# Patient Record
Sex: Female | Born: 1955 | Race: Black or African American | Hispanic: No | Smoking: Current every day smoker
Health system: Southern US, Community
[De-identification: ages and names within clinical notes are randomized; demographics above are authoritative.]

## PROBLEM LIST (undated history)

## (undated) DIAGNOSIS — I341 Nonrheumatic mitral (valve) prolapse: Secondary | ICD-10-CM

## (undated) DIAGNOSIS — Z720 Tobacco use: Secondary | ICD-10-CM

## (undated) DIAGNOSIS — B1920 Unspecified viral hepatitis C without hepatic coma: Secondary | ICD-10-CM

## (undated) DIAGNOSIS — B9681 Helicobacter pylori [H. pylori] as the cause of diseases classified elsewhere: Secondary | ICD-10-CM

## (undated) DIAGNOSIS — F1010 Alcohol abuse, uncomplicated: Secondary | ICD-10-CM

## (undated) DIAGNOSIS — Z9189 Other specified personal risk factors, not elsewhere classified: Secondary | ICD-10-CM

## (undated) DIAGNOSIS — K7460 Unspecified cirrhosis of liver: Secondary | ICD-10-CM

## (undated) DIAGNOSIS — M79606 Pain in leg, unspecified: Secondary | ICD-10-CM

## (undated) DIAGNOSIS — Z9289 Personal history of other medical treatment: Secondary | ICD-10-CM

## (undated) DIAGNOSIS — C220 Liver cell carcinoma: Secondary | ICD-10-CM

## (undated) DIAGNOSIS — Q782 Osteopetrosis: Secondary | ICD-10-CM

## (undated) DIAGNOSIS — K2970 Gastritis, unspecified, without bleeding: Secondary | ICD-10-CM

## (undated) DIAGNOSIS — I10 Essential (primary) hypertension: Secondary | ICD-10-CM

## (undated) HISTORY — DX: Nonrheumatic mitral (valve) prolapse: I34.1

## (undated) HISTORY — DX: Other specified personal risk factors, not elsewhere classified: Z91.89

## (undated) HISTORY — PX: OTHER SURGICAL HISTORY: SHX169

## (undated) HISTORY — DX: Essential (primary) hypertension: I10

## (undated) HISTORY — PX: LAPAROSCOPY: SHX197

## (undated) HISTORY — DX: Tobacco use: Z72.0

## (undated) HISTORY — DX: Personal history of other medical treatment: Z92.89

## (undated) HISTORY — DX: Gastritis, unspecified, without bleeding: K29.70

## (undated) HISTORY — DX: Alcohol abuse, uncomplicated: F10.10

## (undated) HISTORY — PX: ABDOMINAL HYSTERECTOMY: SHX81

## (undated) HISTORY — DX: Helicobacter pylori (H. pylori) as the cause of diseases classified elsewhere: B96.81

## (undated) HISTORY — PX: CARPAL TUNNEL RELEASE: SHX101

## (undated) HISTORY — DX: Pain in leg, unspecified: M79.606

---

## 2003-08-09 ENCOUNTER — Inpatient Hospital Stay (HOSPITAL_COMMUNITY): Admission: EM | Admit: 2003-08-09 | Discharge: 2003-08-11 | Payer: Self-pay | Admitting: Emergency Medicine

## 2006-06-19 ENCOUNTER — Ambulatory Visit: Payer: Self-pay | Admitting: Family Medicine

## 2006-06-20 ENCOUNTER — Ambulatory Visit: Payer: Self-pay | Admitting: *Deleted

## 2006-06-21 ENCOUNTER — Ambulatory Visit: Payer: Self-pay | Admitting: Family Medicine

## 2006-06-23 ENCOUNTER — Ambulatory Visit (HOSPITAL_COMMUNITY): Admission: RE | Admit: 2006-06-23 | Discharge: 2006-06-23 | Payer: Self-pay | Admitting: Family Medicine

## 2006-06-27 ENCOUNTER — Ambulatory Visit: Payer: Self-pay | Admitting: Internal Medicine

## 2006-07-10 ENCOUNTER — Ambulatory Visit: Payer: Self-pay | Admitting: Family Medicine

## 2006-07-17 ENCOUNTER — Ambulatory Visit: Payer: Self-pay | Admitting: Internal Medicine

## 2006-07-24 ENCOUNTER — Ambulatory Visit: Payer: Self-pay | Admitting: Internal Medicine

## 2006-07-27 ENCOUNTER — Ambulatory Visit (HOSPITAL_COMMUNITY): Admission: RE | Admit: 2006-07-27 | Discharge: 2006-07-27 | Payer: Self-pay | Admitting: Family Medicine

## 2006-09-25 ENCOUNTER — Ambulatory Visit: Payer: Self-pay | Admitting: Family Medicine

## 2007-06-13 ENCOUNTER — Encounter (INDEPENDENT_AMBULATORY_CARE_PROVIDER_SITE_OTHER): Payer: Self-pay | Admitting: *Deleted

## 2007-06-21 ENCOUNTER — Ambulatory Visit: Payer: Self-pay | Admitting: Internal Medicine

## 2007-06-21 LAB — CONVERTED CEMR LAB
ALT: 51 units/L — ABNORMAL HIGH (ref 0–35)
AST: 52 units/L — ABNORMAL HIGH (ref 0–37)
Albumin: 3.9 g/dL (ref 3.5–5.2)
Alkaline Phosphatase: 82 units/L (ref 39–117)
BUN: 11 mg/dL (ref 6–23)
CO2: 25 meq/L (ref 19–32)
Calcium: 10 mg/dL (ref 8.4–10.5)
Chloride: 103 meq/L (ref 96–112)
Creatinine, Ser: 0.86 mg/dL (ref 0.40–1.20)
Glucose, Bld: 87 mg/dL (ref 70–99)
Potassium: 4.4 meq/L (ref 3.5–5.3)
Sodium: 139 meq/L (ref 135–145)
Total Bilirubin: 0.5 mg/dL (ref 0.3–1.2)
Total Protein: 8.1 g/dL (ref 6.0–8.3)

## 2007-07-09 ENCOUNTER — Ambulatory Visit: Payer: Self-pay | Admitting: Internal Medicine

## 2007-07-09 LAB — CONVERTED CEMR LAB
Basophils Absolute: 0 10*3/uL (ref 0.0–0.1)
Basophils Relative: 0 % (ref 0–1)
Cholesterol: 198 mg/dL (ref 0–200)
Eosinophils Absolute: 0.1 10*3/uL (ref 0.0–0.7)
Eosinophils Relative: 1 % (ref 0–5)
HCT: 45.6 % (ref 36.0–46.0)
HDL: 62 mg/dL (ref >39)
Hemoglobin: 14.9 g/dL (ref 12.0–15.0)
LDL Cholesterol: 120 mg/dL — ABNORMAL HIGH (ref 0–99)
Lymphocytes Relative: 51 % — ABNORMAL HIGH (ref 12–46)
Lymphs Abs: 4.1 10*3/uL — ABNORMAL HIGH (ref 0.7–3.3)
MCHC: 32.7 g/dL (ref 30.0–36.0)
MCV: 99.8 fL (ref 78.0–100.0)
Monocytes Absolute: 1.4 10*3/uL — ABNORMAL HIGH (ref 0.2–0.7)
Monocytes Relative: 17 % — ABNORMAL HIGH (ref 3–11)
Neutro Abs: 2.5 10*3/uL (ref 1.7–7.7)
Neutrophils Relative %: 31 % — ABNORMAL LOW (ref 43–77)
Platelets: 320 10*3/uL (ref 150–400)
RBC: 4.57 M/uL (ref 3.87–5.11)
RDW: 14.7 % — ABNORMAL HIGH (ref 11.5–14.0)
Total CHOL/HDL Ratio: 3.2
Triglycerides: 78 mg/dL (ref <150)
VLDL: 16 mg/dL (ref 0–40)
WBC: 8.1 10*3/uL (ref 4.0–10.5)

## 2007-09-05 ENCOUNTER — Encounter: Admission: RE | Admit: 2007-09-05 | Discharge: 2007-09-05 | Payer: Self-pay | Admitting: Family Medicine

## 2008-01-17 ENCOUNTER — Ambulatory Visit: Payer: Self-pay | Admitting: Internal Medicine

## 2008-04-18 ENCOUNTER — Emergency Department (HOSPITAL_COMMUNITY): Admission: EM | Admit: 2008-04-18 | Discharge: 2008-04-19 | Payer: Self-pay | Admitting: Emergency Medicine

## 2009-07-17 ENCOUNTER — Ambulatory Visit: Payer: Self-pay | Admitting: Internal Medicine

## 2009-07-17 ENCOUNTER — Encounter (INDEPENDENT_AMBULATORY_CARE_PROVIDER_SITE_OTHER): Payer: Self-pay | Admitting: Adult Health

## 2009-07-17 LAB — CONVERTED CEMR LAB
ALT: 188 units/L — ABNORMAL HIGH (ref 0–35)
AST: 212 units/L — ABNORMAL HIGH (ref 0–37)
Albumin: 3.9 g/dL (ref 3.5–5.2)
Alkaline Phosphatase: 85 units/L (ref 39–117)
BUN: 11 mg/dL (ref 6–23)
Basophils Absolute: 0 10*3/uL (ref 0.0–0.1)
Basophils Relative: 1 % (ref 0–1)
CO2: 22 meq/L (ref 19–32)
Calcium: 9.2 mg/dL (ref 8.4–10.5)
Chloride: 105 meq/L (ref 96–112)
Cholesterol: 213 mg/dL — ABNORMAL HIGH (ref 0–200)
Creatinine, Ser: 0.76 mg/dL (ref 0.40–1.20)
Eosinophils Absolute: 0.1 10*3/uL (ref 0.0–0.7)
Eosinophils Relative: 1 % (ref 0–5)
Folate: 17.8 ng/mL
Glucose, Bld: 63 mg/dL — ABNORMAL LOW (ref 70–99)
HCT: 44.6 % (ref 36.0–46.0)
HDL: 61 mg/dL (ref >39)
Hemoglobin: 14 g/dL (ref 12.0–15.0)
LDL Cholesterol: 136 mg/dL — ABNORMAL HIGH (ref 0–99)
Lymphocytes Relative: 56 % — ABNORMAL HIGH (ref 12–46)
Lymphs Abs: 4.2 10*3/uL — ABNORMAL HIGH (ref 0.7–4.0)
MCHC: 31.4 g/dL (ref 30.0–36.0)
MCV: 102.3 fL — ABNORMAL HIGH (ref 78.0–100.0)
Monocytes Absolute: 1 10*3/uL (ref 0.1–1.0)
Monocytes Relative: 13 % — ABNORMAL HIGH (ref 3–12)
Neutro Abs: 2.2 10*3/uL (ref 1.7–7.7)
Neutrophils Relative %: 30 % — ABNORMAL LOW (ref 43–77)
Platelets: 255 10*3/uL (ref 150–400)
Potassium: 4.3 meq/L (ref 3.5–5.3)
RBC: 4.36 M/uL (ref 3.87–5.11)
RDW: 14.9 % (ref 11.5–15.5)
RPR Ser Ql: REACTIVE — AB
RPR Titer: 1:1 {titer}
Sodium: 139 meq/L (ref 135–145)
T pallidum Antibodies (TP-PA): 31.2 — ABNORMAL HIGH (ref <1.0)
Total Bilirubin: 0.8 mg/dL (ref 0.3–1.2)
Total CHOL/HDL Ratio: 3.5
Total Protein: 8.2 g/dL (ref 6.0–8.3)
Triglycerides: 82 mg/dL (ref <150)
VLDL: 16 mg/dL (ref 0–40)
Vit D, 25-Hydroxy: 20 ng/mL — ABNORMAL LOW (ref 30–89)
Vitamin B-12: 748 pg/mL (ref 211–911)
WBC: 7.5 10*3/uL (ref 4.0–10.5)

## 2009-07-21 ENCOUNTER — Ambulatory Visit (HOSPITAL_COMMUNITY): Admission: RE | Admit: 2009-07-21 | Discharge: 2009-07-21 | Payer: Self-pay | Admitting: Internal Medicine

## 2009-08-05 ENCOUNTER — Ambulatory Visit (HOSPITAL_COMMUNITY): Admission: RE | Admit: 2009-08-05 | Discharge: 2009-08-05 | Payer: Self-pay | Admitting: Internal Medicine

## 2009-08-17 ENCOUNTER — Ambulatory Visit: Payer: Self-pay | Admitting: Internal Medicine

## 2009-08-17 ENCOUNTER — Encounter (INDEPENDENT_AMBULATORY_CARE_PROVIDER_SITE_OTHER): Payer: Self-pay | Admitting: Adult Health

## 2009-08-17 LAB — CONVERTED CEMR LAB
BUN: 13 mg/dL (ref 6–23)
CO2: 24 meq/L (ref 19–32)
Calcium: 9.4 mg/dL (ref 8.4–10.5)
Chloride: 103 meq/L (ref 96–112)
Creatinine, Ser: 0.76 mg/dL (ref 0.40–1.20)
Glucose, Bld: 84 mg/dL (ref 70–99)
Potassium: 4.2 meq/L (ref 3.5–5.3)
Sodium: 139 meq/L (ref 135–145)

## 2009-08-18 ENCOUNTER — Ambulatory Visit: Payer: Self-pay | Admitting: Internal Medicine

## 2009-11-16 ENCOUNTER — Encounter: Payer: Self-pay | Admitting: Internal Medicine

## 2009-11-16 ENCOUNTER — Encounter (INDEPENDENT_AMBULATORY_CARE_PROVIDER_SITE_OTHER): Payer: Self-pay | Admitting: Adult Health

## 2009-11-16 ENCOUNTER — Ambulatory Visit: Payer: Self-pay | Admitting: Internal Medicine

## 2009-11-16 LAB — CONVERTED CEMR LAB
Amphetamine Screen, Ur: NEGATIVE
Barbiturate Quant, Ur: NEGATIVE
Benzodiazepines.: NEGATIVE
Chlamydia, Swab/Urine, PCR: NEGATIVE
Cocaine Metabolites: POSITIVE — AB
Creatinine,U: 95.4 mg/dL
GC Probe Amp, Urine: NEGATIVE
Marijuana Metabolite: POSITIVE — AB
Methadone: NEGATIVE
Microalb, Ur: 3.71 mg/dL — ABNORMAL HIGH (ref 0.00–1.89)
Opiate Screen, Urine: NEGATIVE
Pap Smear: NEGATIVE
Phencyclidine (PCP): NEGATIVE
Propoxyphene: NEGATIVE

## 2010-02-15 ENCOUNTER — Encounter: Payer: Self-pay | Admitting: Internal Medicine

## 2010-02-15 ENCOUNTER — Ambulatory Visit: Payer: Self-pay | Admitting: Internal Medicine

## 2010-02-15 ENCOUNTER — Encounter (INDEPENDENT_AMBULATORY_CARE_PROVIDER_SITE_OTHER): Payer: Self-pay | Admitting: Adult Health

## 2010-02-15 LAB — CONVERTED CEMR LAB
ALT: 158 units/L — ABNORMAL HIGH (ref 0–35)
AST: 185 units/L — ABNORMAL HIGH (ref 0–37)
Albumin: 3.7 g/dL (ref 3.5–5.2)
Alcohol, Ethyl (B): 63 mg/dL — ABNORMAL HIGH (ref 0–10)
Alkaline Phosphatase: 77 units/L (ref 39–117)
BUN: 9 mg/dL (ref 6–23)
CO2: 24 meq/L (ref 19–32)
Calcium: 8.9 mg/dL (ref 8.4–10.5)
Chloride: 105 meq/L (ref 96–112)
Cholesterol: 188 mg/dL (ref 0–200)
Creatinine, Ser: 0.67 mg/dL (ref 0.40–1.20)
Glucose, Bld: 130 mg/dL — ABNORMAL HIGH (ref 70–99)
HDL: 59 mg/dL (ref >39)
LDL Cholesterol: 86 mg/dL (ref 0–99)
Potassium: 3.9 meq/L (ref 3.5–5.3)
Sodium: 140 meq/L (ref 135–145)
Total Bilirubin: 0.5 mg/dL (ref 0.3–1.2)
Total CHOL/HDL Ratio: 3.2
Total Protein: 7.7 g/dL (ref 6.0–8.3)
Triglycerides: 217 mg/dL — ABNORMAL HIGH (ref <150)
VLDL: 43 mg/dL — ABNORMAL HIGH (ref 0–40)
Vit D, 25-Hydroxy: 27 ng/mL — ABNORMAL LOW (ref 30–89)

## 2010-02-23 ENCOUNTER — Ambulatory Visit (HOSPITAL_COMMUNITY): Admission: RE | Admit: 2010-02-23 | Discharge: 2010-02-23 | Payer: Self-pay | Admitting: Internal Medicine

## 2010-03-08 ENCOUNTER — Ambulatory Visit: Payer: Self-pay | Admitting: Internal Medicine

## 2010-03-18 ENCOUNTER — Ambulatory Visit: Payer: Self-pay | Admitting: Internal Medicine

## 2010-03-18 DIAGNOSIS — I10 Essential (primary) hypertension: Secondary | ICD-10-CM

## 2010-03-18 DIAGNOSIS — R079 Chest pain, unspecified: Secondary | ICD-10-CM | POA: Insufficient documentation

## 2010-04-09 ENCOUNTER — Ambulatory Visit: Payer: Self-pay | Admitting: Cardiovascular Disease

## 2010-04-09 ENCOUNTER — Encounter: Payer: Self-pay | Admitting: Internal Medicine

## 2010-04-09 ENCOUNTER — Ambulatory Visit (HOSPITAL_COMMUNITY): Admission: RE | Admit: 2010-04-09 | Discharge: 2010-04-09 | Payer: Self-pay | Admitting: Internal Medicine

## 2010-04-09 ENCOUNTER — Ambulatory Visit: Payer: Self-pay

## 2010-04-15 ENCOUNTER — Encounter: Payer: Self-pay | Admitting: Internal Medicine

## 2010-04-27 ENCOUNTER — Telehealth (INDEPENDENT_AMBULATORY_CARE_PROVIDER_SITE_OTHER): Payer: Self-pay | Admitting: Radiology

## 2010-04-28 ENCOUNTER — Encounter: Payer: Self-pay | Admitting: Cardiovascular Disease

## 2010-04-28 ENCOUNTER — Ambulatory Visit: Payer: Self-pay

## 2010-04-28 ENCOUNTER — Ambulatory Visit: Payer: Self-pay | Admitting: Cardiovascular Disease

## 2010-04-28 ENCOUNTER — Encounter (HOSPITAL_COMMUNITY): Admission: RE | Admit: 2010-04-28 | Discharge: 2010-06-17 | Payer: Self-pay | Admitting: Internal Medicine

## 2010-05-04 ENCOUNTER — Telehealth: Payer: Self-pay | Admitting: Internal Medicine

## 2010-05-06 ENCOUNTER — Encounter: Payer: Self-pay | Admitting: Internal Medicine

## 2010-05-06 DIAGNOSIS — R0602 Shortness of breath: Secondary | ICD-10-CM | POA: Insufficient documentation

## 2010-05-06 DIAGNOSIS — M79609 Pain in unspecified limb: Secondary | ICD-10-CM | POA: Insufficient documentation

## 2010-05-20 ENCOUNTER — Ambulatory Visit (HOSPITAL_COMMUNITY): Admission: RE | Admit: 2010-05-20 | Discharge: 2010-05-20 | Payer: Self-pay | Admitting: Internal Medicine

## 2010-05-20 ENCOUNTER — Encounter: Payer: Self-pay | Admitting: Internal Medicine

## 2010-05-20 ENCOUNTER — Ambulatory Visit: Payer: Self-pay

## 2010-06-01 ENCOUNTER — Telehealth: Payer: Self-pay | Admitting: Internal Medicine

## 2010-06-23 ENCOUNTER — Ambulatory Visit: Payer: Self-pay | Admitting: Internal Medicine

## 2010-06-23 ENCOUNTER — Encounter: Payer: Self-pay | Admitting: Internal Medicine

## 2010-09-08 ENCOUNTER — Ambulatory Visit (HOSPITAL_COMMUNITY)
Admission: RE | Admit: 2010-09-08 | Discharge: 2010-09-08 | Payer: Self-pay | Source: Home / Self Care | Attending: Internal Medicine | Admitting: Internal Medicine

## 2010-09-21 ENCOUNTER — Emergency Department (HOSPITAL_COMMUNITY)
Admission: EM | Admit: 2010-09-21 | Discharge: 2010-09-22 | Payer: Self-pay | Source: Home / Self Care | Admitting: Emergency Medicine

## 2010-10-05 ENCOUNTER — Emergency Department (HOSPITAL_COMMUNITY)
Admission: EM | Admit: 2010-10-05 | Discharge: 2010-10-05 | Payer: Self-pay | Source: Home / Self Care | Admitting: Emergency Medicine

## 2010-10-08 ENCOUNTER — Ambulatory Visit (HOSPITAL_COMMUNITY)
Admission: RE | Admit: 2010-10-08 | Discharge: 2010-10-08 | Payer: Self-pay | Source: Home / Self Care | Attending: Obstetrics and Gynecology | Admitting: Obstetrics and Gynecology

## 2010-10-26 NOTE — Progress Notes (Signed)
Summary: test results pt aware   Phone Note Call from Patient   Caller: Patient Reason for Call: Talk to Nurse, Lab or Test Results Summary of Call: pt calling to get test results-phone out-pls call neighbor Philomena Course [phone] or [phone] Initial call taken by: Glynda Jaeger,  June 01, 2010 2:28 PM  Follow-up for Phone Call        Left message to call back. Julieta Gutting, RN, BSN  June 01, 2010 2:40 PM  [facility] Scherrie Bateman, LPN  June 02, 2010 3:22 PM     Appended Document: test results**LMTCB** pt left message to call her at [phone]  at [facility] house with her results  Appended Document: test results**LMTCB** pt aware of results

## 2010-10-26 NOTE — Miscellaneous (Signed)
  Clinical Lists Changes  Orders: Added new Referral order of Nuclear Stress Test (Nuc Stress Test) - Signed 

## 2010-10-26 NOTE — Letter (Signed)
Summary: [facility]  [facility]   Imported By: Debby Freiberg 03/30/2010 13:59:16  _____________________________________________________________________  External Attachment:    Type:   Image     Comment:   External Document

## 2010-10-26 NOTE — Miscellaneous (Signed)
  Clinical Lists Changes  Problems: Added new problem of LEG PAIN (ICD-729.5) Added new problem of SHORTNESS OF BREATH (ICD-786.05) Orders: Added new Test order of Arterial Duplex Lower Extremity (Arterial Duplex Low) - Signed Added new Referral order of CPX Test at [facility] (CPX Test) - Signed

## 2010-10-26 NOTE — Assessment & Plan Note (Signed)
Summary: Cardiology Nuclear Testing  Nuclear Med Background Indications for Stress Test: Evaluation for Ischemia   History: Echo, MVP  History Comments: 04/09/10 Echo: EF=55-60%  Symptoms: Chest Tightness, Chest Tightness with Exertion, DOE, Fatigue with Exertion, Palpitations, Rapid HR  Symptoms Comments: Last episode of VW:[id number] in waiting area.   Nuclear Pre-Procedure Cardiac Risk Factors: Family History - CAD, Hypertension, Smoker Caffeine/Decaff Intake: none NPO After: 11:30 PM Lungs: Clear.  O2 Sat 98% on RA. IV 0.9% NS with Angio Cath: 22g     IV Site: (L) AC IV Started by: Irean Hong RN Chest Size (in) 38     Cup Size C     Height (in): 63 Weight (lb): 120 BMI: 21.33 Tech Comments: No medications taken today.  Nuclear Med Study 1 or 2 day study:  1 day     Stress Test Type:  Eugenie Birks Reading MD:  Charlton Haws, MD     Referring MD:  Dietrich Pates, MD Resting Radionuclide:  Technetium 47m Tetrofosmin     Resting Radionuclide Dose:  10.8 mCi  Stress Radionuclide:  Technetium 70m Tetrofosmin     Stress Radionuclide Dose:  32.2 mCi   Stress Protocol   Lexiscan: 0.4 mg   Stress Test Technologist:  [employer]a [employer] CMA-N     Nuclear Technologist:  Domenic Polite CNMT  Rest Procedure  Myocardial perfusion imaging was performed at rest 45 minutes following the intravenous administration of Myoview Technetium 16m Tetrofosmin.  Stress Procedure  Patient was initially scheduled to walk the treadmill utilizing the Bruce protocol, but had to be changed to lexiscan due to baseline hypertension.  The patient received IV Lexiscan 0.4 mg over 15-seconds.  Myoview injected at 30-seconds.  There were no significant changes with infusion.  She did c/o chest pain with infusion.  Quantitative spect images were obtained after a 45 minute delay.  QPS Raw Data Images:  Normal; no motion artifact; normal heart/lung ratio. Stress Images:  NI: Uniform and normal uptake of tracer in all  myocardial segments. Rest Images:  Normal homogeneous uptake in all areas of the myocardium. Subtraction (SDS):  Normal Transient Ischemic Dilatation:  .94  (Normal <1.22)  Lung/Heart Ratio:  .27  (Normal <0.45)  Quantitative Gated Spect Images QGS EDV:  87 ml QGS ESV:  22 ml QGS EF:  74 % QGS cine images:  normal  Findings Normal nuclear study      Overall Impression  Exercise Capacity: Lexiscan BP Response: Normal blood pressure response. Clinical Symptoms: Nausea ECG Impression: No significant ST segment change suggestive of ischemia. Overall Impression: Normal stress nuclear study.  Appended Document: Cardiology Nuclear Testing Normal myoview.  Appended Document: Cardiology Nuclear Testing [facility] for call back.  Appended Document: Cardiology Nuclear Testing The pt is aware of her results.

## 2010-10-26 NOTE — Progress Notes (Signed)
Summary: Nuc pre-procedure  Phone Note Outgoing Call Call back at Home Phone [phone]   Call placed by: Darrick Penna Summary of Call: Reviewed information on Myoview Information Sheet (see scanned document for further details).  Spoke with pt.      Nuclear Med Background Indications for Stress Test: Evaluation for Ischemia   History: Echo, MVP  History Comments: No known CAD. Alcohol abuse and substance abuse(crack and marijuana. Hep C 04/09/10 Echo EF=55-60%  Symptoms: Chest Pain, Chest Pain with Exertion, Fatigue with Exertion, Palpitations    Nuclear Pre-Procedure Cardiac Risk Factors: Family History - CAD, Hypertension, Smoker Height (in): 63

## 2010-10-26 NOTE — Progress Notes (Signed)
Summary: get myoview results   Phone Note Call from Patient Call back at Home Phone [phone]   Caller: Patient Reason for Call: Talk to Nurse, Talk to Doctor, Lab or Test Results Summary of Call: pt rtn Annice Pih call to get [facility] results Initial call taken by: Omer Jack,  May 04, 2010 10:26 AM  Follow-up for Phone Call        I spoke with the pt. She wanted to let Dr. Tenny Craw know that when she goes up steps she gets SOB/tired and has leg aching. She wanted to make Dr. Tenny Craw aware of this. I explained I will forward this to Dr. Tenny Craw and her nurse, Annice Pih, for review. I will ask that they give the pt a call back after reviewing. Follow-up by: Sherri Rad, RN, BSN,  May 04, 2010 11:03 AM  Additional Follow-up for Phone Call Additional follow up Details #1::        Patient was contacted about myoview. It wal normal. WIth continue SOB set up for cardiopulmonary strestt est and LE dopplers. Additional Follow-up by: Sherrill Raring, MD, [facility],  May 04, 2010 11:13 AM     Appended Document: get myoview results Ordered tests and sent to [facility].

## 2010-10-26 NOTE — Consult Note (Signed)
[facility]   Imported By: Marylou Mccoy 03/17/2010 15:37:55  _____________________________________________________________________  External Attachment:    Type:   Image     Comment:   External Document

## 2010-10-26 NOTE — Assessment & Plan Note (Signed)
Summary: np6/chest pain/hx of drug use/hyperlipidemia/MVP/jml  Medications Added LISINOPRIL-HYDROCHLOROTHIAZIDE 10-12.5 MG TABS (LISINOPRIL-HYDROCHLOROTHIAZIDE) 1 tab once daily VITAMIN B-1 100 MG TABS (THIAMINE HCL) 1 tab two times a day OYSTER SHELL CALCIUM/D 500-125 MG-UNIT TABS (CALCIUM-VITAMIN D) 1 tab two times a day * OTC VITAMIN D 1 tab once daily ASPIRIN 81 MG  TABS (ASPIRIN)  GUAIFENESIN-DM 100-10 MG/5ML SYRP (DEXTROMETHORPHAN-GUAIFENESIN) 2 teaspoons q 4 hours as needed      Allergies Added:   Visit Type:  Initial Consult Primary Provider:  [facility]  CC:  chest pain x ray showed enalrged heart.  History of Present Illness: Patient is a 55 year old who was referred for evaluation of chest pains and choking sensations. The patient has no known history of CAD.  She is followed at [facility].  She reports about a 3 month history of chest pains.  Not associated with any particular acitvity.  it is a pinching grabbing sensation.  Goes away on own.  Can have L arm discomfort as well.  Says at times her chest just feels eery.   Also has episodes of choking.  Occur with activity and also in bed.    Feels tired alot.  Notes occasional palpitations but no dizziness.  No syncope.  Current Medications (verified): 1)  Lisinopril-Hydrochlorothiazide 10-12.5 Mg Tabs (Lisinopril-Hydrochlorothiazide) .Marland Kitchen.. 1 Tab Once Daily 2)  Vitamin B-1 100 Mg Tabs (Thiamine Hcl) .Marland Kitchen.. 1 Tab Two Times A Day 3)  Oyster Shell Calcium/d 500-125 Mg-Unit Tabs (Calcium-Vitamin D) .Marland Kitchen.. 1 Tab Two Times A Day 4)  Otc Vitamin D .... 1 Tab Once Daily 5)  Aspirin 81 Mg  Tabs (Aspirin) 6)  Guaifenesin-Dm 100-10 Mg/78ml Syrp (Dextromethorphan-Guaifenesin) .... 2 Teaspoons Q 4 Hours As Needed  Allergies (verified): 1)  ! Sulfa 2)  ! Celebrex  Past History:  Family History: Last updated: Apr 02, 2010 Mother died at 66.  Multiple medical problems.  Social History: Last updated: 04-02-10 current smoker  w/ilast 12mos., heavy drinker(3 40 oz beers per day).  History of crack use (latest 1 wk ago), marijuana , lives with relatives- and is divorced   Past Medical History: allergies, hepatitis C, mitral valve prolapse HTN  Past Surgical History:  hysterectomy  carpal tunnel arm surgery for break  Family History: Mother died at 61.  Multiple medical problems.  Social History: current smoker w/ilast 12mos., heavy drinker(3 40 oz beers per day).  History of crack use (latest 1 wk ago), marijuana , lives with relatives- and is divorced   Review of Systems       All systems reviewed.  Negative3 to the above problems except as noted.  Vital Signs:  Patient profile:   55 year old female Height:      63 inches Weight:      122 pounds BMI:     21.69 Pulse rate:   52 / minute BP sitting:   138 / 68  (left arm) Cuff size:   regular  Vitals Entered By: Burnett Kanaris, CNA (2010-04-02 10:59 AM)  Physical Exam  Additional Exam:  patient in NAD HEENT:  Normocephalic, atraumatic. EOMI, PERRLA.  Neck: JVP is normal. No thyromegaly. No bruits.  Lungs: clear to auscultation. No rales no wheezes.  Heart: Regular rate and rhythm. Normal S1, S2. No S3.   No significant murmurs. PMI not displaced. but mildly diffuse  Abdomen:  Supple, nontender. Normal bowel sounds. No masses. No hepatomegaly.  Extremities:   Good distal pulses throughout. No lower extremity edema.  Musculoskeletal :moving all extremities.  Neuro:   alert and oriented x3.    EKG  Procedure date:  02/15/2010  Findings:      NSR.  72 bpm.  Impression & Recommendations:  Problem # 1:  CHEST PAIN UNSPECIFIED (ICD-786.50) Patient is a 55 year old with a history of chest pain that is not completely typical of angina.  SHe does have risk factors especially with drug use.  CXR without significant cardiomgaly.    I would recommend starting with an echo to evaluate LV function.  If down will need a L heart cath to define.   If normal, consider myoview. Counselled on drug use.  Problem # 2:  HYPERTENSION, BENIGN (ICD-401.1) Continue meds.  Will need to follow. Her updated medication list for this problem includes:    Lisinopril-hydrochlorothiazide 10-12.5 Mg Tabs (Lisinopril-hydrochlorothiazide) .Marland Kitchen... 1 tab once daily    Aspirin 81 Mg Tabs (Aspirin)  Other Orders: Echocardiogram (Echo)  Patient Instructions: 1)  Your physician has requested that you have an echocardiogram.  Echocardiography is a painless test that uses sound waves to create images of your heart. It provides your doctor with information about the size and shape of your heart and how well your heart's chambers and valves are working.  This procedure takes approximately one hour. There are no restrictions for this procedure. we will call you with results.

## 2010-12-06 LAB — URINE MICROSCOPIC-ADD ON

## 2010-12-06 LAB — URINALYSIS, ROUTINE W REFLEX MICROSCOPIC
Glucose, UA: NEGATIVE mg/dL
Hgb urine dipstick: NEGATIVE
Protein, ur: NEGATIVE mg/dL

## 2010-12-06 LAB — DIFFERENTIAL
Eosinophils Absolute: 0.1 10*3/uL (ref 0.0–0.7)
Monocytes Absolute: 1.1 10*3/uL — ABNORMAL HIGH (ref 0.1–1.0)
Neutrophils Relative %: 29 % — ABNORMAL LOW (ref 43–77)

## 2010-12-06 LAB — CBC
Hemoglobin: 14.2 g/dL (ref 12.0–15.0)
MCH: 34 pg (ref 26.0–34.0)
MCV: 99.8 fL (ref 78.0–100.0)
RBC: 4.18 MIL/uL (ref 3.87–5.11)

## 2010-12-06 LAB — COMPREHENSIVE METABOLIC PANEL
BUN: 10 mg/dL (ref 6–23)
CO2: 30 mEq/L (ref 19–32)
Chloride: 97 mEq/L (ref 96–112)
Creatinine, Ser: 0.77 mg/dL (ref 0.4–1.2)
GFR calc non Af Amer: >60 mL/min (ref >60)
Glucose, Bld: 78 mg/dL (ref 70–99)
Total Bilirubin: 1.3 mg/dL — ABNORMAL HIGH (ref 0.3–1.2)

## 2010-12-06 LAB — URINE CULTURE: Culture: NO GROWTH

## 2010-12-06 LAB — LIPASE, BLOOD: Lipase: 40 U/L (ref 11–59)
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
## 2011-06-01 ENCOUNTER — Emergency Department (HOSPITAL_COMMUNITY)
Admission: EM | Admit: 2011-06-01 | Discharge: 2011-06-01 | Disposition: A | Discharge status: Home / Self Care | Payer: Self-pay | Attending: Emergency Medicine | Admitting: Emergency Medicine

## 2011-06-01 DIAGNOSIS — Z8619 Personal history of other infectious and parasitic diseases: Secondary | ICD-10-CM | POA: Insufficient documentation

## 2011-06-01 DIAGNOSIS — Z79899 Other long term (current) drug therapy: Secondary | ICD-10-CM | POA: Insufficient documentation

## 2011-06-01 DIAGNOSIS — I10 Essential (primary) hypertension: Secondary | ICD-10-CM | POA: Insufficient documentation

## 2011-06-01 DIAGNOSIS — L298 Other pruritus: Secondary | ICD-10-CM | POA: Insufficient documentation

## 2011-06-01 DIAGNOSIS — N390 Urinary tract infection, site not specified: Secondary | ICD-10-CM | POA: Insufficient documentation

## 2011-06-01 DIAGNOSIS — B86 Scabies: Secondary | ICD-10-CM | POA: Insufficient documentation

## 2011-06-01 DIAGNOSIS — R350 Frequency of micturition: Secondary | ICD-10-CM | POA: Insufficient documentation

## 2011-06-01 DIAGNOSIS — L2989 Other pruritus: Secondary | ICD-10-CM | POA: Insufficient documentation

## 2011-06-01 DIAGNOSIS — R21 Rash and other nonspecific skin eruption: Secondary | ICD-10-CM | POA: Insufficient documentation

## 2011-06-01 LAB — URINE MICROSCOPIC-ADD ON

## 2011-06-01 LAB — URINALYSIS, ROUTINE W REFLEX MICROSCOPIC
Glucose, UA: NEGATIVE mg/dL
Hgb urine dipstick: NEGATIVE
Ketones, ur: 15 mg/dL — AB
Protein, ur: 30 mg/dL — AB
pH: 6 (ref 5.0–8.0)

## 2011-06-24 LAB — URINALYSIS, ROUTINE W REFLEX MICROSCOPIC
Bilirubin Urine: NEGATIVE
Glucose, UA: NEGATIVE
Ketones, ur: NEGATIVE
Specific Gravity, Urine: 1.019
pH: 5

## 2011-06-24 LAB — CBC
HCT: 43.4
Platelets: 220
WBC: 10.2

## 2011-06-24 LAB — POCT CARDIAC MARKERS
CKMB, poc: 1.9
Myoglobin, poc: 66.7
Myoglobin, poc: 69.5
Operator id: 133351
Operator id: 133351
Troponin i, poc: <0.05

## 2011-06-24 LAB — BASIC METABOLIC PANEL
BUN: 8
Calcium: 9.4
GFR calc non Af Amer: >60
Potassium: 4

## 2011-06-24 LAB — URINE CULTURE
Colony Count: NO GROWTH
Culture: NO GROWTH

## 2011-06-24 LAB — DIFFERENTIAL
Basophils Absolute: 0.1
Eosinophils Relative: 1
Lymphocytes Relative: 58 — ABNORMAL HIGH
Lymphs Abs: 5.9 — ABNORMAL HIGH
Neutro Abs: 3.1
Neutrophils Relative %: 30 — ABNORMAL LOW

## 2011-06-24 LAB — ETHANOL: Alcohol, Ethyl (B): <5

## 2011-09-01 ENCOUNTER — Other Ambulatory Visit (HOSPITAL_COMMUNITY): Payer: Self-pay | Admitting: Family Medicine

## 2011-09-01 DIAGNOSIS — Z1231 Encounter for screening mammogram for malignant neoplasm of breast: Secondary | ICD-10-CM

## 2011-10-10 ENCOUNTER — Ambulatory Visit (HOSPITAL_COMMUNITY): Payer: Self-pay

## 2011-10-18 ENCOUNTER — Other Ambulatory Visit (HOSPITAL_COMMUNITY): Payer: Self-pay | Admitting: Family Medicine

## 2011-10-18 DIAGNOSIS — F1020 Alcohol dependence, uncomplicated: Secondary | ICD-10-CM

## 2011-10-18 DIAGNOSIS — R109 Unspecified abdominal pain: Secondary | ICD-10-CM

## 2011-10-24 ENCOUNTER — Ambulatory Visit (HOSPITAL_COMMUNITY)
Admission: RE | Admit: 2011-10-24 | Discharge: 2011-10-24 | Disposition: A | Discharge status: Home / Self Care | Payer: Self-pay | Source: Ambulatory Visit | Attending: Family Medicine | Admitting: Family Medicine

## 2011-10-24 DIAGNOSIS — F1020 Alcohol dependence, uncomplicated: Secondary | ICD-10-CM

## 2011-10-24 DIAGNOSIS — R7989 Other specified abnormal findings of blood chemistry: Secondary | ICD-10-CM | POA: Insufficient documentation

## 2011-10-24 DIAGNOSIS — B1920 Unspecified viral hepatitis C without hepatic coma: Secondary | ICD-10-CM | POA: Insufficient documentation

## 2011-11-04 ENCOUNTER — Ambulatory Visit (HOSPITAL_COMMUNITY)
Admission: RE | Admit: 2011-11-04 | Discharge: 2011-11-04 | Disposition: A | Discharge status: Home / Self Care | Payer: Self-pay | Source: Ambulatory Visit | Attending: Family Medicine | Admitting: Family Medicine

## 2011-11-04 DIAGNOSIS — Z1231 Encounter for screening mammogram for malignant neoplasm of breast: Secondary | ICD-10-CM | POA: Insufficient documentation

## 2012-08-08 ENCOUNTER — Emergency Department (HOSPITAL_COMMUNITY)
Admission: EM | Admit: 2012-08-08 | Discharge: 2012-08-08 | Disposition: A | Discharge status: Home / Self Care | Payer: Self-pay | Attending: Emergency Medicine | Admitting: Emergency Medicine

## 2012-08-08 ENCOUNTER — Encounter (HOSPITAL_COMMUNITY): Payer: Self-pay | Admitting: Emergency Medicine

## 2012-08-08 ENCOUNTER — Emergency Department (HOSPITAL_COMMUNITY): Payer: Self-pay

## 2012-08-08 DIAGNOSIS — R079 Chest pain, unspecified: Secondary | ICD-10-CM | POA: Insufficient documentation

## 2012-08-08 DIAGNOSIS — Q782 Osteopetrosis: Secondary | ICD-10-CM | POA: Insufficient documentation

## 2012-08-08 DIAGNOSIS — B182 Chronic viral hepatitis C: Secondary | ICD-10-CM | POA: Insufficient documentation

## 2012-08-08 DIAGNOSIS — Z8739 Personal history of other diseases of the musculoskeletal system and connective tissue: Secondary | ICD-10-CM | POA: Insufficient documentation

## 2012-08-08 DIAGNOSIS — B1920 Unspecified viral hepatitis C without hepatic coma: Secondary | ICD-10-CM | POA: Insufficient documentation

## 2012-08-08 DIAGNOSIS — R0602 Shortness of breath: Secondary | ICD-10-CM | POA: Insufficient documentation

## 2012-08-08 DIAGNOSIS — R1310 Dysphagia, unspecified: Secondary | ICD-10-CM | POA: Insufficient documentation

## 2012-08-08 DIAGNOSIS — F17200 Nicotine dependence, unspecified, uncomplicated: Secondary | ICD-10-CM | POA: Insufficient documentation

## 2012-08-08 HISTORY — DX: Osteopetrosis: Q78.2

## 2012-08-08 HISTORY — DX: Unspecified viral hepatitis C without hepatic coma: B19.20

## 2012-08-08 LAB — BASIC METABOLIC PANEL
Calcium: 9.1 mg/dL (ref 8.4–10.5)
GFR calc Af Amer: >90 mL/min (ref >90)
GFR calc non Af Amer: >90 mL/min (ref >90)
Glucose, Bld: 97 mg/dL (ref 70–99)
Potassium: 3.5 mEq/L (ref 3.5–5.1)
Sodium: 135 mEq/L (ref 135–145)

## 2012-08-08 LAB — CBC WITH DIFFERENTIAL/PLATELET
Basophils Absolute: 0.1 10*3/uL (ref 0.0–0.1)
Eosinophils Absolute: 0.1 10*3/uL (ref 0.0–0.7)
HCT: 27.6 % — ABNORMAL LOW (ref 36.0–46.0)
Lymphocytes Relative: 41 % (ref 12–46)
MCHC: 31.2 g/dL (ref 30.0–36.0)
Monocytes Relative: 18 % — ABNORMAL HIGH (ref 3–12)
Neutrophils Relative %: 39 % — ABNORMAL LOW (ref 43–77)
Platelets: 293 10*3/uL (ref 150–400)
RDW: 20.8 % — ABNORMAL HIGH (ref 11.5–15.5)
WBC: 8.3 10*3/uL (ref 4.0–10.5)

## 2012-08-08 LAB — POCT I-STAT TROPONIN I: Troponin i, poc: 0.01 ng/mL (ref 0.00–0.08)

## 2012-08-08 MED ORDER — RANITIDINE HCL 150 MG PO CAPS: 150.0000 mg | ORAL_CAPSULE | Freq: Every day | ORAL | Status: DC

## 2012-08-08 MED ORDER — GI COCKTAIL ~~LOC~~
30.0000 mL | Freq: Once | ORAL | Status: AC
  Administered 2012-08-08: 30 mL via ORAL
  Filled 2012-08-08: qty 30

## 2012-08-08 MED ORDER — FERROUS SULFATE 325 (65 FE) MG PO TABS: 650.0000 mg | ORAL_TABLET | Freq: Every day | ORAL | Status: DC

## 2012-08-08 MED ORDER — NITROGLYCERIN 0.4 MG SL SUBL
0.4000 mg | SUBLINGUAL_TABLET | SUBLINGUAL | Status: DC | PRN
  Administered 2012-08-08: 0.4 mg via SUBLINGUAL
  Filled 2012-08-08: qty 25

## 2012-08-08 MED ORDER — DOCUSATE SODIUM 100 MG PO CAPS: 100.0000 mg | ORAL_CAPSULE | Freq: Two times a day (BID) | ORAL | Status: DC | PRN

## 2012-08-08 MED ORDER — ALUM & MAG HYDROXIDE-SIMETH 400-400-40 MG/5ML PO SUSP: 10.0000 mL | Freq: Four times a day (QID) | ORAL | Status: DC | PRN

## 2012-08-08 NOTE — ED Provider Notes (Signed)
History     CSN: [id number]  Arrival date & time 08/08/12  [place]   First MD Initiated Contact with Patient 08/08/12 2026      Chief Complaint  Patient presents with  . Chest Pain    (Consider location/radiation/quality/duration/timing/severity/associated sxs/prior treatment) HPI Comments: The patient comes into the ER today to have her pain in her chest evaluated. She has had 3 weeks of intermittent gradually worsening substernal chest pain, she states she has the sensation of something "stuck "in her chest after swallowing. She does not swallow then her pain improves, but anytime she swallows either any solid or liquid or her saliva her pain increases significantly for several seconds and then gradually wanes off. She also gets short of breath for a few seconds during the painful episodes. She has never had anything like this before 3 weeks ago. She does not get exertional chest pain, she has no cardiac history of high blood pressure. She states she is still able to swallow her food and liquid but feels like she almost regurgitates it.  Patient is a 56 y.o. female presenting with chest pain. The history is provided by the patient. No language interpreter was used.  Chest Pain The chest pain began more than 2 weeks ago. Duration of episode(s) is 10 seconds. Chest pain occurs frequently. The chest pain is worsening. The pain is associated with eating. At its most intense, the pain is at 10/10. The pain is currently at 8/10. The severity of the pain is severe. The quality of the pain is described as tightness. The pain radiates to the upper back. Chest pain is worsened by eating. Primary symptoms include shortness of breath and cough (chronic, unchanged). Pertinent negatives for primary symptoms include no fever, no wheezing, no abdominal pain, no nausea and no vomiting.  Pertinent negatives for associated symptoms include no claudication, no diaphoresis, no lower extremity edema and no weakness.  She tried nothing for the symptoms. Risk factors include being elderly and smoking/tobacco exposure.  Her past medical history is significant for hypertension.  Pertinent negatives for past medical history include no MI.     Past Medical History  Diagnosis Date  . Osteopetrosis   . Hepatitis C     History reviewed. No pertinent past surgical history.  History reviewed. No pertinent family history.  History  Substance Use Topics  . Smoking status: Current Every Day Smoker  . Smokeless tobacco: Not on file  . Alcohol Use: Yes    OB History    Grav Para Term Preterm Abortions TAB SAB Ect Mult Living                  Review of Systems  Constitutional: Negative for fever, chills, diaphoresis, activity change and appetite change.  HENT: Positive for trouble swallowing. Negative for congestion, rhinorrhea, neck pain, neck stiffness and sinus pressure.   Eyes: Negative for discharge and visual disturbance.  Respiratory: Positive for cough (chronic, unchanged) and shortness of breath. Negative for chest tightness, wheezing and stridor.   Cardiovascular: Positive for chest pain. Negative for claudication and leg swelling.  Gastrointestinal: Negative for nausea, vomiting, abdominal pain, diarrhea and abdominal distention.  Genitourinary: Negative for decreased urine volume and difficulty urinating.  Musculoskeletal: Negative for back pain and arthralgias.  Skin: Negative for color change and pallor.  Neurological: Negative for weakness, light-headedness and headaches.  Psychiatric/Behavioral: Negative for behavioral problems and agitation.  All other systems reviewed and are negative.    Allergies  Celecoxib and  Sulfonamide derivatives  Home Medications  No current outpatient prescriptions on file.  BP 120/71  Pulse 95  Temp 98.2 F (36.8 C) (Oral)  Resp 18  SpO2 100%  Physical Exam  Nursing note and vitals reviewed. Constitutional: She is oriented to person, place,  and time. She appears well-developed and well-nourished. No distress.  HENT:  Head: Normocephalic and atraumatic.  Mouth/Throat: No oropharyngeal exudate.  Eyes: EOM are normal. Pupils are equal, round, and reactive to light. Right eye exhibits no discharge. Left eye exhibits no discharge.  Neck: Normal range of motion. Neck supple. No JVD present.  Cardiovascular: Normal rate, regular rhythm and normal heart sounds.   Pulmonary/Chest: Effort normal and breath sounds normal. No stridor. No respiratory distress. She has no wheezes. She has no rales. She exhibits no tenderness.  Abdominal: Soft. Bowel sounds are normal. She exhibits no distension. There is tenderness (mild epigastr). There is no guarding.  Genitourinary: Guaiac negative stool.  Musculoskeletal: Normal range of motion. She exhibits no edema and no tenderness.  Neurological: She is alert and oriented to person, place, and time. No cranial nerve deficit. She exhibits normal muscle tone.  Skin: Skin is warm and dry. No rash noted. She is not diaphoretic.  Psychiatric: She has a normal mood and affect. Her behavior is normal. Judgment and thought content normal.    ED Course  Procedures (including critical care time)  Labs Reviewed  CBC WITH DIFFERENTIAL - Abnormal; Notable for the following:    Hemoglobin 8.6 (*)     HCT 27.6 (*)     MCV 71.3 (*)     MCH 22.2 (*)     RDW 20.8 (*)     Neutrophils Relative 39 (*)     Monocytes Relative 18 (*)     Monocytes Absolute 1.5 (*)     All other components within normal limits  BASIC METABOLIC PANEL  POCT I-STAT TROPONIN I   Dg Chest 2 View  08/08/2012  *RADIOLOGY REPORT*  Clinical Data: Chest discomfort.  Hypertension.  Tobacco abuse.  CHEST - 2 VIEW  Comparison: Two-view chest to 02/14/2012.  Findings: The cardiac silhouette is enlarged.  There is no edema or effusion to suggest failure.  The visualized soft tissues and bony thorax are unremarkable.  IMPRESSION: Mild cardiomegaly  without failure.   Original Report Authenticated By: Marin Roberts, M.D.      1. Dysphagia   2. Chest pain at rest      Date: 08/08/2012  Rate: 92  Rhythm: normal sinus rhythm  QRS Axis: normal  Intervals: normal  ST/T Wave abnormalities: normal  Conduction Disutrbances: none  Narrative Interpretation: nml  Old EKG Reviewed: none   MDM  Here w/ CP but it only occurs when she swallows. This is consistent with dysphagia and concerning for achalasia or possible esophageal mass. Her EKG and lab values do not show anything remarkable or acute. She does have worsening anemia in comparison to her prior value in 2011. She has had no active bleeding as she can speak of and is heme negative on rectal exam. Will give GI referral for evaluation of dysphagia. Will put on iron supplementation for her microcytic anemia. Otherwise she is stable and she is anything acute or emergent in nature at this time. Will also give her prescription for Maalox and H2 blocker since GERD may be a contributor. I have considered and doubt the possible etiologies of chest pain including acute coronary syndrome, pulmonary embolism, aortic dissection, pneumothorax, pneumonia,  rib fracture, Boerhaave esophagus.  Pt deemed stable for discharge. Return precautions were provided and pt expressed understanding to return to ED if any acute symptoms return. Follow up was instructed which pt also expressed understanding. All questions were answered and pt was in agreement w/ plan.         Warrick Parisian, MD 08/08/12 [phone]

## 2012-08-08 NOTE — ED Notes (Signed)
EDP at BS 

## 2012-08-08 NOTE — ED Notes (Signed)
Pain description is vague and inconsistant. (Central chest, L&R & upper mid and lower mid), Comes and goes, states, "have had 3 pains in last Oliger while". Coughing up phlegm at this time. States, sort of "better with 'panting' it down" (breathing). No changes. Alert, calm, NAD, friend at [facility].

## 2012-08-08 NOTE — ED Notes (Signed)
Pt c/o mid sternal CP worse when eating and SOB x 3 weeks

## 2012-08-08 NOTE — ED Notes (Addendum)
Alert, NAD, calm, interactive, skin W&D, resps e/u, speaking in clear complete sentences, mentions chronic arthritic bone pain. Here for epigastric CP. Comes and goes, more frequent and fluctuates. Not aggravated by inspiration or eating. Also mentions non-productive cough. (Denies: sob, sore throat, nvd, fever or cold sx). Denies pain swallowing, but states, "feels like something is getting stuck or not going down". Low hgb noted. hgb hx reviewed.

## 2012-08-11 NOTE — ED Provider Notes (Signed)
I was present consultation and examined the patient in question during their ED stay and agree with the resident's documentation and resident's medical plan.  Jones Skene, MD   Jones Skene, MD 08/11/12 1420

## 2012-09-12 ENCOUNTER — Encounter: Payer: Self-pay | Admitting: Family Medicine

## 2012-09-12 ENCOUNTER — Ambulatory Visit (INDEPENDENT_AMBULATORY_CARE_PROVIDER_SITE_OTHER): Payer: Self-pay | Admitting: Family Medicine

## 2012-09-12 VITALS — BP 128/69 | HR 94 | Temp 98.3°F | Ht 63.5 in | Wt 116.0 lb

## 2012-09-12 DIAGNOSIS — B1920 Unspecified viral hepatitis C without hepatic coma: Secondary | ICD-10-CM

## 2012-09-12 DIAGNOSIS — M25519 Pain in unspecified shoulder: Secondary | ICD-10-CM | POA: Insufficient documentation

## 2012-09-12 DIAGNOSIS — Z207 Contact with and (suspected) exposure to pediculosis, acariasis and other infestations: Secondary | ICD-10-CM

## 2012-09-12 DIAGNOSIS — Z2089 Contact with and (suspected) exposure to other communicable diseases: Secondary | ICD-10-CM

## 2012-09-12 DIAGNOSIS — I10 Essential (primary) hypertension: Secondary | ICD-10-CM

## 2012-09-12 MED ORDER — DICLOFENAC SODIUM 75 MG PO TBEC: 75.0000 mg | DELAYED_RELEASE_TABLET | Freq: Two times a day (BID) | ORAL | Status: DC

## 2012-09-12 MED ORDER — PERMETHRIN 5 % EX CREA: TOPICAL_CREAM | Freq: Once | CUTANEOUS | Status: DC

## 2012-09-12 MED ORDER — LISINOPRIL 20 MG PO TABS: 20.0000 mg | ORAL_TABLET | Freq: Every day | ORAL | Status: DC

## 2012-09-12 NOTE — Assessment & Plan Note (Signed)
No signs of rotator cuff tear. Pain likely secondary to arthritic changes. Will give Voltaren PO BID since renal function was within normal limits at last check. Will explore pain further at future visits but will not prescribe chronic narcotic pain medication.

## 2012-09-12 NOTE — Assessment & Plan Note (Signed)
BP elevated in the past and requesting to restart Lisinopril. Her BP well controlled today, but definitely recorded high in the past. Given Rx today but unsure if she will have medication filled since she has no source of income. Montior BP closely at next visit. If too low, will d/c medications.

## 2012-09-12 NOTE — Assessment & Plan Note (Signed)
Scleral icterus, abd distention and alcohol use. Will need to check labs as soon as she gets [employer]. Encouraged to cut back on alcohol use.

## 2012-09-12 NOTE — Progress Notes (Signed)
Patient ID: Lisa [name], female   DOB: 1956-09-07, 56 y.o.   MRN: [id number] [name] Lisa [name] M. Lisa Waln, MD Phone: [phone]   Subjective: HPI: Patient is a 56 y.o. female presenting to clinic today for new patient appointment. Previously seen by [facility]. Went to [facility] for dysphagia in November 2013, recommended EGD but was not ordered since the patient does not have a PCP. Concerns today include possible scabies and pain "all over."  1. Scabies- Has had infection in the past and this feels the same. 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 Reports the rash as itchy and irritating. No rash on body that she has noticed.   2. Pain- Has overall pain, worst in right shoulder. She states she has thin bones and wonders if that has anything to do with it. Has Hep C history. No redness, swelling or rashes but does report some numbness of arms.   3. Tobacco use- Cutting back on smoking. Does not seem interested in quitting at this time, but knows she needs to due to "poor circulation" in herself as well as her mother. Also, more stressed that usual with her father passing and she has been smoking more.   History Reviewed: Everyday smoker - 1/2 ppd, cutting back per report  Health Maintenance:  Pap- Does not need. S/p hysterectomy Mammo- Feb 2013 Colonoscopy- 2013 at [facility] Flu shot- no yet Pnuemovax- Never Tdap- Unsure, probably more than 10 years.  ROS: Please see HPI above.  Objective: Office vital signs reviewed.  Physical Examination:  General: Awake, alert. NAD. Very talkative but pleasant HEENT: Atraumatic, normocephalic. +scleral icterus. Missing multiple teeth. Fine rash on face with some red central scabbing. Neck: No masses palpated. No LAD Pulm: CTAB, no wheezes Cardio: RRR, 2/6 Systolic murmur appreciated Abdomen:+BS, soft, nontender, moderately distended Extremities: No edema. Full ROM  shoulders bilaterally 5/5 strength in all directions. Neg apprehension testing.  Neuro: Grossly intact  Assessment: 56 yo new patient appointment  Plan: See Problem List and After Visit Summary

## 2012-09-12 NOTE — Patient Instructions (Addendum)
It was nice to meet you today.  Please get your medicine filled for your blood pressure, your arthritis pain and for your rash. Please call Britta Mccreedy to make an appointment for your [facility]. When you get the [facility], please come back to see me for a follow up appointment.  Take care! Kerina Simoneau M. Latasha Puskas, M.D.

## 2012-09-12 NOTE — Assessment & Plan Note (Signed)
Not typical distribution but she feels like it is the same as her past infection. Will give permethrin cream, if she can afford it. Take OTC Benadryl for itching.

## 2012-10-03 ENCOUNTER — Encounter (HOSPITAL_COMMUNITY): Payer: Self-pay | Admitting: Emergency Medicine

## 2012-10-03 ENCOUNTER — Emergency Department (HOSPITAL_COMMUNITY)
Admission: EM | Admit: 2012-10-03 | Discharge: 2012-10-03 | Disposition: A | Discharge status: Home / Self Care | Payer: Self-pay | Attending: Emergency Medicine | Admitting: Emergency Medicine

## 2012-10-03 DIAGNOSIS — Z8679 Personal history of other diseases of the circulatory system: Secondary | ICD-10-CM | POA: Insufficient documentation

## 2012-10-03 DIAGNOSIS — I10 Essential (primary) hypertension: Secondary | ICD-10-CM | POA: Insufficient documentation

## 2012-10-03 DIAGNOSIS — Q782 Osteopetrosis: Secondary | ICD-10-CM | POA: Insufficient documentation

## 2012-10-03 DIAGNOSIS — R221 Localized swelling, mass and lump, neck: Secondary | ICD-10-CM | POA: Insufficient documentation

## 2012-10-03 DIAGNOSIS — Z8619 Personal history of other infectious and parasitic diseases: Secondary | ICD-10-CM | POA: Insufficient documentation

## 2012-10-03 DIAGNOSIS — K047 Periapical abscess without sinus: Secondary | ICD-10-CM | POA: Insufficient documentation

## 2012-10-03 DIAGNOSIS — F1010 Alcohol abuse, uncomplicated: Secondary | ICD-10-CM | POA: Insufficient documentation

## 2012-10-03 DIAGNOSIS — F17200 Nicotine dependence, unspecified, uncomplicated: Secondary | ICD-10-CM | POA: Insufficient documentation

## 2012-10-03 DIAGNOSIS — R220 Localized swelling, mass and lump, head: Secondary | ICD-10-CM | POA: Insufficient documentation

## 2012-10-03 MED ORDER — PENICILLIN V POTASSIUM 250 MG PO TABS
500.0000 mg | ORAL_TABLET | Freq: Once | ORAL | Status: AC
  Administered 2012-10-03: 500 mg via ORAL
  Filled 2012-10-03: qty 2

## 2012-10-03 MED ORDER — OXYCODONE-ACETAMINOPHEN 5-325 MG PO TABS
2.0000 | ORAL_TABLET | Freq: Once | ORAL | Status: AC
  Administered 2012-10-03: 2 via ORAL
  Filled 2012-10-03: qty 2

## 2012-10-03 MED ORDER — PENICILLIN V POTASSIUM 500 MG PO TABS: 500.0000 mg | ORAL_TABLET | Freq: Three times a day (TID) | ORAL | Status: DC

## 2012-10-03 MED ORDER — HYDROCODONE-ACETAMINOPHEN 5-325 MG PO TABS: 1.0000 | ORAL_TABLET | Freq: Four times a day (QID) | ORAL | Status: DC | PRN

## 2012-10-03 NOTE — ED Notes (Addendum)
Presents with right sided facial swelling and dental pain. C/o one week of pain and today face began to be swollen. Airway intact.  Top upper right "eye tooth"

## 2012-10-03 NOTE — ED Provider Notes (Signed)
Medical screening examination/treatment/procedure(s) were performed by non-physician practitioner and as supervising physician I was immediately available for consultation/collaboration.  Quinlan Mcfall R. Berneita Sanagustin, MD 10/03/12 2333 

## 2012-10-03 NOTE — ED Provider Notes (Signed)
History   This chart was scribed for non-physician practitioner working with Lisa [name]. Lisa Payor, MD by Lisa [name], ED Scribe. This patient was seen in room TR04C/TR04C and the patient's care was started at 7:44 PM.    CSN: [id number]  Arrival date & time 10/03/12  1805   First MD Initiated Contact with Patient 10/03/12 1907      Chief Complaint  Patient presents with  . Dental Pain    The history is provided by the patient. No language interpreter was used.   Lisa [name] is a 57 y.o. female with h/o osteopetrosis, hepatitis C, HTN, and mitral valve prolapse who presents to the Emergency Department complaining of a right-sided dental abscess that has been present for several weeks, gradually worsening over the past 2-3 days and rapidly worsening this morning causing significantly increased swelling and pain.  Patient's pain is severe.  Nothing makes the pain better. Pt is a current everyday smoker and reports daily alcohol use.    Past Medical History  Diagnosis Date  . Osteopetrosis   . Hepatitis C   . Tobacco use   . Alcohol abuse, daily use   . HTN (hypertension)   . Poor dental hygiene   . Mitral valve prolapse     Past Surgical History  Procedure Date  . Abdominal hysterectomy   . Laparoscopy   . Rod in right arm   . Carpal tunnel release     Family History  Problem Relation Age of Onset  . Cancer Father     Prostate    History  Substance Use Topics  . Smoking status: Current Every Day Smoker -- 0.5 packs/day    Types: Cigarettes  . Smokeless tobacco: Not on file  . Alcohol Use: Yes     Comment: Drinks daily, two 40 oz beer daily    OB History    Grav Para Term Preterm Abortions TAB SAB Ect Mult Living   1 1 1       1       Review of Systems  HENT: Positive for facial swelling and dental problem.     Allergies  Celecoxib and Sulfonamide derivatives  Home Medications   Current Outpatient Rx  Name  Route  Sig  Dispense  Refill  . ALUM & MAG  HYDROXIDE-SIMETH 400-400-40 MG/5ML PO SUSP   Oral   Take 10 mLs by mouth every 6 (six) hours as needed. For indigestion         . DICLOFENAC SODIUM 75 MG PO TBEC   Oral   Take 1 tablet (75 mg total) by mouth 2 (two) times daily.   60 tablet   1   . DOCUSATE SODIUM 100 MG PO CAPS   Oral   Take 1 capsule (100 mg total) by mouth 2 (two) times daily as needed for constipation.   60 capsule   0   . LISINOPRIL 20 MG PO TABS   Oral   Take 1 tablet (20 mg total) by mouth daily.   90 tablet   3     BP 146/65  Pulse 89  Temp 99 F (37.2 C) (Oral)  Resp 18  SpO2 100%  Physical Exam  Nursing note and vitals reviewed. Constitutional: She is oriented to person, place, and time. She appears well-developed and well-nourished. No distress.  HENT:  Head: Normocephalic and atraumatic.       Poor dentition throughout, swollen right cheek, no tonsillar or peritonsillar abscess, no signs of Ludwig's angina, airways  intact.   Eyes: EOM are normal.  Neck: Neck supple. No tracheal deviation present.  Cardiovascular: Normal rate.   Pulmonary/Chest: Effort normal. No respiratory distress.  Musculoskeletal: Normal range of motion.  Neurological: She is alert and oriented to person, place, and time.  Skin: Skin is warm and dry.  Psychiatric: She has a normal mood and affect. Her behavior is normal.    ED Course  Procedures (including critical care time) DIAGNOSTIC STUDIES: Oxygen Saturation is 100% on room air, normal by my interpretation.    COORDINATION OF CARE: 7:49 PM- Patient informed of clinical course, understands medical decision-making process, and agrees with plan.     1. Dental abscess       MDM  57 year old female with dental abscess. Will treat the patient in the emergency department with penicillin and Percocet. Will discharge her with penicillin and Norco. Stressed the importance of taking the penicillin, and following up with the dentist. Patient understands and  agrees with the plan. She is stable and ready for discharge.  I personally performed the services described in this documentation, which was scribed in my presence. The recorded information has been reviewed and is accurate.         Lisa Horseman, PA-C 10/03/12 2001

## 2013-01-01 ENCOUNTER — Emergency Department (HOSPITAL_COMMUNITY)
Admission: EM | Admit: 2013-01-01 | Discharge: 2013-01-01 | Disposition: A | Discharge status: Home / Self Care | Payer: No Typology Code available for payment source | Source: Home / Self Care | Attending: Emergency Medicine | Admitting: Emergency Medicine

## 2013-01-01 ENCOUNTER — Telehealth: Payer: Self-pay | Admitting: *Deleted

## 2013-01-01 NOTE — Telephone Encounter (Signed)
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  No appts available this afternoon.  Informed patient can schedule appt for tomorrow or she can go to urgent care today for eval.  Patient agreeable.  Gaylene Brooks, RN

## 2013-01-03 ENCOUNTER — Encounter (HOSPITAL_COMMUNITY): Payer: Self-pay | Admitting: *Deleted

## 2013-01-03 ENCOUNTER — Emergency Department (INDEPENDENT_AMBULATORY_CARE_PROVIDER_SITE_OTHER)
Admission: EM | Admit: 2013-01-03 | Discharge: 2013-01-03 | Disposition: A | Discharge status: Home / Self Care | Payer: No Typology Code available for payment source | Source: Home / Self Care | Attending: Emergency Medicine | Admitting: Emergency Medicine

## 2013-01-03 DIAGNOSIS — R21 Rash and other nonspecific skin eruption: Secondary | ICD-10-CM

## 2013-01-03 MED ORDER — HYDROCORTISONE 1 % EX CREA: TOPICAL_CREAM | CUTANEOUS | Status: DC

## 2013-01-03 MED ORDER — DESLORATADINE 5 MG PO TABS: 5.0000 mg | ORAL_TABLET | Freq: Every day | ORAL | Status: DC

## 2013-01-03 NOTE — ED Notes (Signed)
Pt   Somewhat  Anxious        Has  Been bathing  With  clorox        PT  REPORTS  SYMPTOMS  OF  RASH  WITH ITCHING          Pt  Has  Scabs   On     Back                X  3   No  Angioedema  No  Shortness  Of  Breath

## 2013-01-03 NOTE — ED Provider Notes (Signed)
History     CSN: [id number]  Arrival date & time 01/03/13  1052   First MD Initiated Contact with Patient 01/03/13 1241      Chief Complaint  Patient presents with  . Rash    (Consider location/radiation/quality/duration/timing/severity/associated sxs/prior treatment) Patient is a 57 y.o. female presenting with rash. The history is provided by the patient.  Rash Location:  Face and torso Facial rash location:  Face and nose Torso rash location:  Upper back Quality comment:  ITCHING   Past Medical History  Diagnosis Date  . Osteopetrosis   . Hepatitis C   . Tobacco use   . Alcohol abuse, daily use   . HTN (hypertension)   . Poor dental hygiene   . Mitral valve prolapse     Past Surgical History  Procedure Laterality Date  . Abdominal hysterectomy    . Laparoscopy    . Rod in right arm    . Carpal tunnel release      Family History  Problem Relation Age of Onset  . Cancer Father     Prostate    History  Substance Use Topics  . Smoking status: Current Every Day Smoker -- 0.50 packs/day    Types: Cigarettes  . Smokeless tobacco: Not on file  . Alcohol Use: Yes     Comment: Drinks daily, two 40 oz beer daily    OB History   Grav Para Term Preterm Abortions TAB SAB Ect Mult Living   1 1 1       1       Review of Systems  Constitutional: Negative.   Skin: Positive for rash.  Allergic/Immunologic: Positive for environmental allergies.  All other systems reviewed and are negative.    Allergies  Celecoxib and Sulfonamide derivatives  Home Medications   Current Outpatient Rx  Name  Route  Sig  Dispense  Refill  . alum & mag hydroxide-simeth (MAALOX PLUS) 400-400-40 MG/5ML suspension   Oral   Take 10 mLs by mouth every 6 (six) hours as needed. For indigestion         . desloratadine (CLARINEX) 5 MG tablet   Oral   Take 1 tablet (5 mg total) by mouth daily.   30 tablet   1   . diclofenac (VOLTAREN) 75 MG EC tablet   Oral   Take 1 tablet  (75 mg total) by mouth 2 (two) times daily.   60 tablet   1   . docusate sodium (COLACE) 100 MG capsule   Oral   Take 1 capsule (100 mg total) by mouth 2 (two) times daily as needed for constipation.   60 capsule   0   . HYDROcodone-acetaminophen (NORCO/VICODIN) 5-325 MG per tablet   Oral   Take 1 tablet by mouth every 6 (six) hours as needed for pain.   10 tablet   0   . hydrocortisone cream 1 %      Apply to affected area 2 times daily   15 g   0   . lisinopril (PRINIVIL,ZESTRIL) 20 MG tablet   Oral   Take 1 tablet (20 mg total) by mouth daily.   90 tablet   3   . penicillin v potassium (VEETID) 500 MG tablet   Oral   Take 1 tablet (500 mg total) by mouth 3 (three) times daily.   30 tablet   0     BP 185/82  Pulse 82  Temp(Src) 98.6 F (37 C) (Oral)  SpO2 100%  Physical Exam  Constitutional: She appears well-developed and well-nourished.  HENT:  MILD SINUS CONGESTION  Eyes: EOM are normal. Pupils are equal, round, and reactive to light.  Neck: Normal range of motion.  Cardiovascular: Normal rate and regular rhythm.   Pulmonary/Chest: Effort normal and breath sounds normal.  Abdominal: Soft.  Skin: Skin is warm and dry. No rash noted.    ED Course  Procedures (including critical care time)  Labs Reviewed - No data to display No results found.   1. Rash and nonspecific skin eruption       MDM          Jani Files, MD 01/23/13 [phone]

## 2013-01-24 ENCOUNTER — Other Ambulatory Visit (HOSPITAL_COMMUNITY)
Admission: RE | Admit: 2013-01-24 | Discharge: 2013-01-24 | Disposition: A | Discharge status: Home / Self Care | Payer: No Typology Code available for payment source | Source: Ambulatory Visit | Attending: Family Medicine | Admitting: Family Medicine

## 2013-01-24 ENCOUNTER — Encounter: Payer: Self-pay | Admitting: Family Medicine

## 2013-01-24 ENCOUNTER — Telehealth: Payer: Self-pay | Admitting: Family Medicine

## 2013-01-24 ENCOUNTER — Ambulatory Visit (INDEPENDENT_AMBULATORY_CARE_PROVIDER_SITE_OTHER): Payer: No Typology Code available for payment source | Admitting: Family Medicine

## 2013-01-24 VITALS — BP 150/78 | HR 76 | Temp 98.6°F | Ht 63.5 in | Wt 122.0 lb

## 2013-01-24 DIAGNOSIS — I10 Essential (primary) hypertension: Secondary | ICD-10-CM

## 2013-01-24 DIAGNOSIS — F329 Major depressive disorder, single episode, unspecified: Secondary | ICD-10-CM | POA: Insufficient documentation

## 2013-01-24 DIAGNOSIS — F3289 Other specified depressive episodes: Secondary | ICD-10-CM

## 2013-01-24 DIAGNOSIS — Z0000 Encounter for general adult medical examination without abnormal findings: Secondary | ICD-10-CM

## 2013-01-24 DIAGNOSIS — Z01419 Encounter for gynecological examination (general) (routine) without abnormal findings: Secondary | ICD-10-CM

## 2013-01-24 DIAGNOSIS — B1920 Unspecified viral hepatitis C without hepatic coma: Secondary | ICD-10-CM

## 2013-01-24 DIAGNOSIS — Z1151 Encounter for screening for human papillomavirus (HPV): Secondary | ICD-10-CM | POA: Insufficient documentation

## 2013-01-24 DIAGNOSIS — F32A Depression, unspecified: Secondary | ICD-10-CM

## 2013-01-24 DIAGNOSIS — Q782 Osteopetrosis: Secondary | ICD-10-CM

## 2013-01-24 LAB — COMPREHENSIVE METABOLIC PANEL
ALT: 53 U/L — ABNORMAL HIGH (ref 0–35)
Albumin: 3.3 g/dL — ABNORMAL LOW (ref 3.5–5.2)
CO2: 27 mEq/L (ref 19–32)
Potassium: 4 mEq/L (ref 3.5–5.3)
Sodium: 138 mEq/L (ref 135–145)
Total Bilirubin: 0.7 mg/dL (ref 0.3–1.2)
Total Protein: 8.3 g/dL (ref 6.0–8.3)

## 2013-01-24 LAB — CBC
MCH: 22.8 pg — ABNORMAL LOW (ref 26.0–34.0)
MCV: 76.5 fL — ABNORMAL LOW (ref 78.0–100.0)
Platelets: 358 10*3/uL (ref 150–400)
RDW: 23.4 % — ABNORMAL HIGH (ref 11.5–15.5)
WBC: 7 10*3/uL (ref 4.0–10.5)

## 2013-01-24 LAB — POCT WET PREP (WET MOUNT)

## 2013-01-24 LAB — LDL CHOLESTEROL, DIRECT: Direct LDL: 94 mg/dL

## 2013-01-24 MED ORDER — METRONIDAZOLE 500 MG PO TABS: 500.0000 mg | ORAL_TABLET | Freq: Two times a day (BID) | ORAL | Status: DC

## 2013-01-24 NOTE — Assessment & Plan Note (Signed)
Pap and wet prep done today.

## 2013-01-24 NOTE — Progress Notes (Signed)
Patient ID: Lisa [name], female   DOB: 22-Nov-1955, 57 y.o.   MRN: [id number]  [name] Lisa Minish M. Vlad Mayberry, MD Phone: [phone]   Subjective: HPI: Patient is a 57 y.o. female presenting to clinic today for CPE and for pelvic exam. Other concerns today include multiple complaints including not being able to afford medications, rash in nose, muscle on left chest, numbness in shoulder, dry rash on breasts, frequent urination, depression/sadness, and trouble seeing. Although all of these are important, I have informed patient that due to time constraints we would only be able to discuss a few of these options.   1. Hypertension Blood pressure today: 150/78 Taking Meds: Ran out 2 weeks ago Side effects: n/a ROS: Endorses all of the following: headache, visual changes, nausea, vomiting, chest pain, abdominal pain or shortness of breath on a pan-positive ROS.  2. Osteopenia- Aching all over, and bones hurt. Tried diclofenac which helps some. Walking makes it worse. Cries a lot for pain. Patient states she was taking other medication in the past, but I have told her I do not prescribe narcotic medication. She states she will try anything.   3. Depression- Feels sad all the time. Does not sleep at night. Drinks beer and smokes marijuana. Decreased appetite. Never been on medication or talked to counselor. Would be interested in starting medication. No SI/HI.   4. Health Maintenance: Pt reports she needs pap smear today, but she is s/p hysterectomy. Is requesting pelvic exam for vaginal itching and discharge.  Does need routine labs including lipids. DEXA 2012, Mammo 2013.  Needs Tdap.  History Reviewed: Every day smoker.  ROS: Please see HPI above.  Objective: Office vital signs reviewed. There were no vitals taken for this visit.  Physical Examination:  General: Awake, alert. NAD HEENT: Atraumatic, normocephalic Neck: No masses palpated. No LAD Pulm: CTAB, no  wheezes Cardio: RRR, no murmurs appreciated Abdomen:+BS, soft, nontender, nondistended GU: No external vaginal lesions. Small amount of white discharge in vaginal. Pap taken of vaginal cuff with some bleeding. Extremities: No edema Neuro: Grossly intact  Assessment: 57 y.o. female CPE.  Plan: See Problem List and After Visit Summary

## 2013-01-24 NOTE — Addendum Note (Signed)
Addended by: Hilarie Fredrickson on: 01/24/2013 05:11 PM   Modules accepted: Orders

## 2013-01-24 NOTE — Patient Instructions (Signed)
It was good to see you today.  I have sent your Lisinopril and Tramadol to the pharmacy. Please call about the MAP program for medication assistance.  Make an appointment to come back to see me very soon to discuss your depression, the MAP program can cover this.  Dalonte Hardage M. Arnecia Ector, M.D.

## 2013-01-24 NOTE — Assessment & Plan Note (Signed)
Unlikely cause of her pain. DEXA up to date. Given Tramadol and RTC in 1 week.

## 2013-01-24 NOTE — Telephone Encounter (Signed)
Wet prep shows bacterial vaginosis. This is likely causing her discomfort. Flagyl sent to [facility] Dept. She should take it BID x7 days.  Please advise to NOT drink alcohol when taking this medication.  Please have her RTC in 1-2 weeks for follow up.  Thanks! Amber M. Hairford, M.D.

## 2013-01-24 NOTE — Assessment & Plan Note (Signed)
Patient endorses depression for a long time with no SI/HI. Due to time constraints with the CPE I did not feel like I could adequately address her concerns and prescribe medication today. I have told her to come back within one to two weeks for follow up appt to discuss her depression. She will need PHQ-9 and likely start medication. In meantime, she should call the MAP program to get in with them so when medication is prescribed it will be covered. Given red flag symptoms that should prompt immediate evaluation.

## 2013-01-24 NOTE — Assessment & Plan Note (Signed)
BP above goal but not taking medications. Will refill Lisinopril and check labs today.

## 2013-01-25 NOTE — Telephone Encounter (Signed)
Spoke with pt, she would prefer med sent to [facility] on [address].  Rx called in verbally (as it was printed on [facility]) [employer], [employer]

## 2013-01-28 ENCOUNTER — Encounter: Payer: Self-pay | Admitting: Family Medicine

## 2013-01-30 ENCOUNTER — Encounter: Payer: Self-pay | Admitting: Family Medicine

## 2013-01-30 ENCOUNTER — Telehealth: Payer: Self-pay | Admitting: Family Medicine

## 2013-01-30 NOTE — Telephone Encounter (Signed)
Pt is asking about her tramadol and lisinopril - was supposed to be sent to MAP She is in the process of getting an appt w/ MAP and needs to know if this has been sent in.

## 2013-01-30 NOTE — Telephone Encounter (Signed)
Spoke with pt and advised that we could not send meds to MAP until she has qualified for that service.  Pt mentions that she would like the tramadol to be called in and she "could borrow the money for it".  Advised I would talk to the MD about this but the lisinopril is the most important of the two meds. Pt states she will call us back when she figures out where she wants to go. [place], Maryjo [place]

## 2013-02-14 ENCOUNTER — Ambulatory Visit (INDEPENDENT_AMBULATORY_CARE_PROVIDER_SITE_OTHER): Payer: No Typology Code available for payment source | Admitting: Family Medicine

## 2013-02-14 ENCOUNTER — Encounter: Payer: Self-pay | Admitting: Family Medicine

## 2013-02-14 VITALS — BP 168/82 | HR 64 | Temp 97.8°F | Wt 122.0 lb

## 2013-02-14 DIAGNOSIS — I10 Essential (primary) hypertension: Secondary | ICD-10-CM

## 2013-02-14 DIAGNOSIS — F329 Major depressive disorder, single episode, unspecified: Secondary | ICD-10-CM

## 2013-02-14 DIAGNOSIS — IMO0002 Reserved for concepts with insufficient information to code with codable children: Secondary | ICD-10-CM

## 2013-02-14 DIAGNOSIS — F32A Depression, unspecified: Secondary | ICD-10-CM

## 2013-02-14 DIAGNOSIS — S39012A Strain of muscle, fascia and tendon of lower back, initial encounter: Secondary | ICD-10-CM | POA: Insufficient documentation

## 2013-02-14 MED ORDER — LISINOPRIL 20 MG PO TABS: 20.0000 mg | ORAL_TABLET | Freq: Every day | ORAL | Status: DC

## 2013-02-14 MED ORDER — TRAMADOL HCL 50 MG PO TABS: 50.0000 mg | ORAL_TABLET | Freq: Three times a day (TID) | ORAL | Status: DC | PRN

## 2013-02-14 MED ORDER — CYCLOBENZAPRINE HCL 5 MG PO TABS: 5.0000 mg | ORAL_TABLET | Freq: Three times a day (TID) | ORAL | Status: DC | PRN

## 2013-02-14 NOTE — Assessment & Plan Note (Signed)
Elevated today, likely secondary to pain and not taking medications. Lisinopril printed for her to take to MAP once she gets her new ID card. Recheck in 2 weeks.

## 2013-02-14 NOTE — Patient Instructions (Signed)
It was good to see you today, I am sorry you are not feeling well.  I think your back pain is from a muscle strain. I have printed off a muscle relaxer (Flexeril), a pain pill (Tramadol) and your blood pressure medication (Lisinopril).  Use Ice on your back also to help with inflammation.  Once you get MAP, I think we should start an anti-depressant for you.   Amber M. Hairford, M.D.

## 2013-02-14 NOTE — Assessment & Plan Note (Signed)
Obvious depression, and patient recognizes this. Unable to afford medications at this time. I forgot PHQ-9 again today since pt continued to have multiple complaints. I think many of her symptoms will improve with treatment of her depression. She will also likely benefit from therapy, although she seems hesitant. Will return in 2 weeks, and she should have MAP by then. Will start antidepressant at that time, if patient is still willing.
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
## 2013-02-22 ENCOUNTER — Telehealth: Payer: Self-pay | Admitting: *Deleted

## 2013-02-22 ENCOUNTER — Ambulatory Visit (INDEPENDENT_AMBULATORY_CARE_PROVIDER_SITE_OTHER): Payer: No Typology Code available for payment source | Admitting: *Deleted

## 2013-02-22 DIAGNOSIS — Z111 Encounter for screening for respiratory tuberculosis: Secondary | ICD-10-CM

## 2013-02-22 NOTE — Telephone Encounter (Signed)
Returned call to pt regarding copay with MAP program. Pt verbalized understanding. Wyatt Haste, RN-BSN

## 2013-02-22 NOTE — Progress Notes (Signed)
Tuberculin skin test applied to right ventral forearm.Appointment for Mondaymade by pt. Pt also concerned that she is unable to afford meds. With map program - states that MAP suggested changing meds. MAP called and states that there is $6 copay for all of Lisa [name] meds - no free ones are available in that category. Message left regarding this for Lisa [name]. Wyatt Haste, RN-BSN

## 2013-02-25 ENCOUNTER — Encounter: Payer: Self-pay | Admitting: *Deleted

## 2013-02-25 ENCOUNTER — Ambulatory Visit (INDEPENDENT_AMBULATORY_CARE_PROVIDER_SITE_OTHER): Payer: No Typology Code available for payment source | Admitting: *Deleted

## 2013-02-25 DIAGNOSIS — Z111 Encounter for screening for respiratory tuberculosis: Secondary | ICD-10-CM

## 2013-02-25 LAB — TB SKIN TEST: Induration: 0 mm

## 2013-02-25 NOTE — Progress Notes (Signed)
PPD Reading Note PPD read and results entered in EpicCare. Result: 0 mm induration. Interpretation: negative Wyatt Haste, RN-BSN

## 2013-03-21 ENCOUNTER — Ambulatory Visit (INDEPENDENT_AMBULATORY_CARE_PROVIDER_SITE_OTHER): Payer: No Typology Code available for payment source | Admitting: Family Medicine

## 2013-03-21 VITALS — BP 173/74 | HR 86 | Temp 98.1°F | Ht 63.0 in | Wt 119.0 lb

## 2013-03-21 DIAGNOSIS — L299 Pruritus, unspecified: Secondary | ICD-10-CM

## 2013-03-21 MED ORDER — MUPIROCIN 2 % EX OINT: TOPICAL_OINTMENT | Freq: Three times a day (TID) | CUTANEOUS | Status: DC

## 2013-03-21 MED ORDER — HYDROXYZINE PAMOATE 25 MG PO CAPS: 25.0000 mg | ORAL_CAPSULE | Freq: Three times a day (TID) | ORAL | Status: DC | PRN

## 2013-03-21 MED ORDER — TRIAMCINOLONE ACETONIDE 0.1 % EX CREA: TOPICAL_CREAM | Freq: Two times a day (BID) | CUTANEOUS | Status: DC

## 2013-03-21 MED ORDER — METRONIDAZOLE 500 MG PO TABS: 500.0000 mg | ORAL_TABLET | Freq: Two times a day (BID) | ORAL | Status: DC

## 2013-03-21 NOTE — Patient Instructions (Addendum)
I have printed the prescriptions I recommend.  Vistaril- to help with the itching. May make you sleepy. You can take over the counter Benadryl instead. Mupirocin- for your nose Flagyl- antibiotic Triamcinolone- stronger cream for your rash.  I hope you feel better. I will see you for your regular appointment.  Jabre Heo M. Kentley Blyden, M.D.

## 2013-03-21 NOTE — Assessment & Plan Note (Signed)
Unsure of exact etiology. Patient may be using an irritating soap or detergent. I do not see any signs of concerning skin rash today. Given Vistarul to use as needed for itching. Also given Triamcinolone cream for areas on arms. Patient's nose does not looks very irritated, although I question if she has been exposed to some type of substance causing her to feel the pain. Will give mupriocin ointment to use since she keeps touching her nostrils. Also, patient reports her vaginal itching has not cleared and would like another round of antibiotics. Last wet prep showed BV.

## 2013-03-21 NOTE — Progress Notes (Signed)
Patient ID: Lisa [name], female   DOB: [birth year], 57 y.o.   MRN: [id number]  [name] Lisa [name] M. Lisa Dona, MD Phone: [phone]   Subjective: HPI: Lisa [name] is here for evaluation of itching all over. She states she always has sores in her nose that do not heal and continue to itch. She also states her eyes are tearing more than usual. Patient's symptoms include skin rash, as well as a feeling that there is something in her eyebrows.  The patient has had these symptoms for 2 months intermittently. Possible triggers have not be identified: No new detergents, lotions or bug bites. The patient has tried the following medications for control of these symptoms: Hydrocortisone cream which did not help. Related symptoms include headaches.  History Reviewed: Everyday smoker.  ROS: Please see HPI above.  Objective: Office vital signs reviewed. BP 173/74  Pulse 86  Temp(Src) 98.1 F (36.7 C) (Oral)  Ht 5\' 3"  (1.6 m)  Wt 119 lb (53.978 kg)  BMI 21.09 kg/m2  Physical Examination:  General: Awake, alert. NAD HEENT: Atraumatic, normocephalic,. MMM. Mild erythema and dryness of nares. Small bumps on exterior of left nostril. No rash or dryness of face or eyebrows appreciated. Neck: No masses palpated. No LAD Extremities: No edema. Fine erythematous rash of arms with excoriations and dry skin. Neuro: Grossly intact  Assessment: 57 y.o. female with diffuse itching  Plan: See Problem List and After Visit Summary

## 2013-04-01 ENCOUNTER — Telehealth: Payer: Self-pay | Admitting: Family Medicine

## 2013-04-01 NOTE — Telephone Encounter (Signed)
Will forward to PCP 

## 2013-04-01 NOTE — Telephone Encounter (Signed)
Pt is requesting that her recent medication: triamcinolone, mupirocin, metronidazole, and hyroxyzine be sent to the [facility] Dept for pick since she has the [place] card and [facility] will not take the [facility] card. JW

## 2013-04-02 ENCOUNTER — Other Ambulatory Visit: Payer: Self-pay | Admitting: Family Medicine

## 2013-04-02 MED ORDER — MUPIROCIN 2 % EX OINT: TOPICAL_OINTMENT | Freq: Three times a day (TID) | CUTANEOUS | Status: DC

## 2013-04-02 MED ORDER — METRONIDAZOLE 500 MG PO TABS: 500.0000 mg | ORAL_TABLET | Freq: Two times a day (BID) | ORAL | Status: DC

## 2013-04-02 MED ORDER — HYDROXYZINE PAMOATE 25 MG PO CAPS: 25.0000 mg | ORAL_CAPSULE | Freq: Three times a day (TID) | ORAL | Status: DC | PRN

## 2013-04-02 MED ORDER — TRIAMCINOLONE ACETONIDE 0.1 % EX CREA: TOPICAL_CREAM | Freq: Two times a day (BID) | CUTANEOUS | Status: DC

## 2013-04-02 NOTE — Telephone Encounter (Signed)
Pt is aware that rx was sent over to [facility].

## 2013-04-02 NOTE — Telephone Encounter (Signed)
Meds faxed to health dept. Thanks.  Latonda Larrivee M. Caylyn Tedeschi, M.D. 04/02/2013 8:18 AM

## 2013-04-15 ENCOUNTER — Ambulatory Visit: Payer: No Typology Code available for payment source | Admitting: Family Medicine

## 2013-06-13 ENCOUNTER — Ambulatory Visit: Payer: No Typology Code available for payment source | Admitting: Family Medicine

## 2013-06-14 ENCOUNTER — Ambulatory Visit: Payer: No Typology Code available for payment source

## 2013-06-17 ENCOUNTER — Ambulatory Visit: Payer: Self-pay | Admitting: Family Medicine

## 2013-07-02 ENCOUNTER — Ambulatory Visit: Payer: Self-pay

## 2013-08-19 ENCOUNTER — Encounter: Payer: Self-pay | Admitting: Family Medicine

## 2013-08-19 ENCOUNTER — Ambulatory Visit (INDEPENDENT_AMBULATORY_CARE_PROVIDER_SITE_OTHER): Payer: No Typology Code available for payment source | Admitting: Family Medicine

## 2013-08-19 VITALS — BP 171/79 | HR 80 | Temp 98.4°F | Ht 63.0 in | Wt 124.0 lb

## 2013-08-19 DIAGNOSIS — F1010 Alcohol abuse, uncomplicated: Secondary | ICD-10-CM

## 2013-08-19 DIAGNOSIS — B1920 Unspecified viral hepatitis C without hepatic coma: Secondary | ICD-10-CM

## 2013-08-19 DIAGNOSIS — L299 Pruritus, unspecified: Secondary | ICD-10-CM

## 2013-08-19 DIAGNOSIS — F411 Generalized anxiety disorder: Secondary | ICD-10-CM

## 2013-08-19 MED ORDER — HYDROXYZINE PAMOATE 25 MG PO CAPS: 25.0000 mg | ORAL_CAPSULE | Freq: Three times a day (TID) | ORAL | Status: DC | PRN

## 2013-08-19 NOTE — Patient Instructions (Addendum)
Thank you for coming in today!  We are checking some labs today, and I will call you if they are abnormal. If you do not hear from me by phone or letter in 2 weeks, please call us as I may have been unable to reach you.   We have faxed a prescription for a medicine called hydroxyzine to the health department. It is $6 and it will help with itching.  I do not think you have scabies or lice or any other parasite.  You should continue to use vaseline on particularly itchy areas to protect your skin.  I also highly recommend you changing soap to [place].  It would help your overall health a lot if you cut down on your drinking.   As you leave, make an appointment to follow up with Dr. Mikel Cella as needed  Please feel free to call with any questions or concerns at any time, at [phone]. - Dr. Jarvis Newcomer

## 2013-08-19 NOTE — Progress Notes (Signed)
Patient ID: Lisa [name], female   DOB: October 11, 1955, 57 y.o.   MRN: [id number] Subjective:  HPI:   Lisa [name] is a 57 y.o. female with h/o hepatitis C, here for itching.  She has had an increase on chronic itching over the past two months. She was seen for this previously and was prescribed a cream which didn't help and a pill which she could not afford and has not tried. She is itching all over: specific areas include scalp, nose, eyes, eyelashes, breasts, back, abdomen, buttocks, thighs, shins. Does not identify any bug bites. Denies new lotions, soaps, detergents.   She mentions that she is constantly worried about the fact that she is unemployed. Thinks itching is worse when her "nerves get bad."   She smokes everyday and drinks an average of 3x 40oz beers everyday. CAGE 1+.   Review of Systems:  Per HPI. All other systems reviewed and are negative.    Objective:  Physical Exam: BP 171/79  Pulse 80  Temp(Src) 98.4 F (36.9 C) (Oral)  Ht 5\' 3"  (1.6 m)  Wt 124 lb (56.246 kg)  BMI 21.97 kg/m2  Gen: 57 y.o. female in NAD HEENT: MMM, EOMI, PERRL CV: RRR, no MRG Resp: Non-labored, CTAB, no wheezes noted Abd: Soft, NTND, BS present, no guarding or organomegaly MSK: No edema noted, full ROM Neuro: Alert and oriented, speech normal Psychiatric: Appears anxious, restless, not agitated or depressed. Well groomed. Judgment good. Insight limited.  Skin: Warm, dry, intact. No eruptions or focal lesions noted. Interdigital spaces normal. Scalp without dandruff, lice, or eggs. No alopecia. Extensor surfaces normal.  Physical Exam  ROS   Assessment:     Lisa [name] is a 57 y.o. female here for generalized pruritus    Plan:     See problem list for problem-specific plans.

## 2013-08-19 NOTE — Assessment & Plan Note (Signed)
Limited insight CAGE 1/4. Advised to cut down (currently approx. 120oz beer/day). Counseled about AA and other recovery resources in the area.

## 2013-08-19 NOTE — Assessment & Plan Note (Signed)
Likely cause/contributor to generalized pruritus. Scleral icterus. Checking CMP for cholestasis.

## 2013-08-19 NOTE — Assessment & Plan Note (Addendum)
Likely psychogenic component to pruritus. Appears anxious throughout interview, admits to general anxiety concerning living situation, and that this may contribute to alcohol use. Advised this is poor coping strategy and that this is only making things worse.

## 2013-08-19 NOTE — Assessment & Plan Note (Addendum)
Chronic complaint not responsive to previous treatment. Generalized, thought to be primarily psychogenic and possibly cholestatic. Checking CMP. No pathognomonic lesions on exam, or culprit medications. No signs of significant excoriations. Topical steroid did not help. Did not fill vistaril due to cost. Reordered this and made patient aware that a month worth is $6 with MAP. Also recommended gentler soap (dove) and vaseline for protection.

## 2013-08-20 ENCOUNTER — Encounter: Payer: Self-pay | Admitting: Family Medicine

## 2013-08-20 LAB — COMPLETE METABOLIC PANEL WITH GFR
CO2: 25 mEq/L (ref 19–32)
Creat: 0.67 mg/dL (ref 0.50–1.10)
GFR, Est African American: >89 mL/min
GFR, Est Non African American: >89 mL/min
Glucose, Bld: 80 mg/dL (ref 70–99)
Total Bilirubin: 0.8 mg/dL (ref 0.3–1.2)

## 2013-09-05 ENCOUNTER — Encounter: Payer: Self-pay | Admitting: Family Medicine

## 2013-09-05 ENCOUNTER — Ambulatory Visit (INDEPENDENT_AMBULATORY_CARE_PROVIDER_SITE_OTHER): Payer: No Typology Code available for payment source | Admitting: Family Medicine

## 2013-09-05 VITALS — BP 203/96 | HR 74 | Temp 98.3°F | Ht 63.0 in | Wt 123.0 lb

## 2013-09-05 DIAGNOSIS — Z1239 Encounter for other screening for malignant neoplasm of breast: Secondary | ICD-10-CM

## 2013-09-05 DIAGNOSIS — M8580 Other specified disorders of bone density and structure, unspecified site: Secondary | ICD-10-CM

## 2013-09-05 DIAGNOSIS — M899 Disorder of bone, unspecified: Secondary | ICD-10-CM

## 2013-09-05 DIAGNOSIS — L299 Pruritus, unspecified: Secondary | ICD-10-CM

## 2013-09-05 MED ORDER — KETOCONAZOLE 2 % EX SHAM: 1.0000 "application " | MEDICATED_SHAMPOO | CUTANEOUS | Status: DC

## 2013-09-05 MED ORDER — TRIAMCINOLONE ACETONIDE 0.1 % EX CREA: TOPICAL_CREAM | Freq: Two times a day (BID) | CUTANEOUS | Status: DC

## 2013-09-05 MED ORDER — TRAMADOL HCL 50 MG PO TABS: 50.0000 mg | ORAL_TABLET | Freq: Three times a day (TID) | ORAL | Status: DC | PRN

## 2013-09-05 NOTE — Progress Notes (Signed)
Patient ID: Lisa [name], female   DOB: 01/19/1956, 57 y.o.   MRN: [id number]    Subjective: HPI: Patient is a 57 y.o. female presenting to clinic today for scalp itching.  Itching - Chronic issue with itching. She states that her scalp is bothering her the most. She states she sometimes sees white spots in her head. She has tried Hydroxyzine some which she said doesn't help. She states inside her eyelids are itching and produce milky substance. She states she has more stress recently. 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 Patient does have a large component of anxiety related to her itching.  History Reviewed: Daily smoker.  ROS: Please see HPI above.  Objective: Office vital signs reviewed. BP 203/96  Pulse 74  Temp(Src) 98.3 F (36.8 C) (Oral)  Ht 5\' 3"  (1.6 m)  Wt 123 lb (55.792 kg)  BMI 21.79 kg/m2  Physical Examination:  General: Awake, alert. NAD. Tearful at times. HEENT: Atraumatic, normocephalic. MMM. Scalp with some excoriations but no lice noted, or erythema of the scalp. Neck: No masses palpated. No LAD Skin: Erythematous papules and macule of shoulders, back and arms with diffuse excoriations and open lesions due to scratching. No pattern to the rash. Skin is diffusely dry. 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

## 2013-09-05 NOTE — Patient Instructions (Signed)
I have prescribed you to use a shampoo twice weekly. Try not to bathe/wash your hair excessively. You have a prescription for skin cream to use on your shoulders and back to help with itching.  I have ordered a mammogram and bone scan. Hopefully the [employer] will cover them.  I will see you back as needed.  Amber M. Hairford, M.D.

## 2013-09-06 NOTE — Assessment & Plan Note (Signed)
Chronic pruritis. Hep C contributing, but most likely anxiety. Encouraged to stay well moisturized. Use Selsun Blue shampoo twice weekly and reduce number of hot baths she takes and how often she washes her hair. (States she cannot afford Nizoral Rx.) Given Triamcinolone cream to use locally to help with itching of shoulders and back. Encouraged stress reduction.  Refilled Tramadol. Health maintenance mammo and dexa ordered.

## 2013-10-01 ENCOUNTER — Ambulatory Visit (HOSPITAL_COMMUNITY): Payer: No Typology Code available for payment source

## 2013-10-08 ENCOUNTER — Ambulatory Visit (HOSPITAL_COMMUNITY): Payer: No Typology Code available for payment source | Attending: Family Medicine

## 2013-11-04 ENCOUNTER — Ambulatory Visit: Payer: No Typology Code available for payment source

## 2014-01-01 ENCOUNTER — Emergency Department (HOSPITAL_COMMUNITY)
Admission: EM | Admit: 2014-01-01 | Discharge: 2014-01-02 | Disposition: A | Discharge status: Home / Self Care | Payer: No Typology Code available for payment source | Attending: Emergency Medicine | Admitting: Emergency Medicine

## 2014-01-01 ENCOUNTER — Emergency Department (HOSPITAL_COMMUNITY): Payer: No Typology Code available for payment source

## 2014-01-01 ENCOUNTER — Encounter (HOSPITAL_COMMUNITY): Payer: Self-pay | Admitting: Emergency Medicine

## 2014-01-01 DIAGNOSIS — R1084 Generalized abdominal pain: Secondary | ICD-10-CM | POA: Insufficient documentation

## 2014-01-01 DIAGNOSIS — Z792 Long term (current) use of antibiotics: Secondary | ICD-10-CM | POA: Insufficient documentation

## 2014-01-01 DIAGNOSIS — R1110 Vomiting, unspecified: Secondary | ICD-10-CM | POA: Insufficient documentation

## 2014-01-01 DIAGNOSIS — IMO0002 Reserved for concepts with insufficient information to code with codable children: Secondary | ICD-10-CM | POA: Insufficient documentation

## 2014-01-01 DIAGNOSIS — Z8619 Personal history of other infectious and parasitic diseases: Secondary | ICD-10-CM | POA: Insufficient documentation

## 2014-01-01 DIAGNOSIS — J111 Influenza due to unidentified influenza virus with other respiratory manifestations: Secondary | ICD-10-CM | POA: Insufficient documentation

## 2014-01-01 DIAGNOSIS — Z8739 Personal history of other diseases of the musculoskeletal system and connective tissue: Secondary | ICD-10-CM | POA: Insufficient documentation

## 2014-01-01 DIAGNOSIS — R000 Tachycardia, unspecified: Secondary | ICD-10-CM | POA: Insufficient documentation

## 2014-01-01 DIAGNOSIS — R011 Cardiac murmur, unspecified: Secondary | ICD-10-CM | POA: Insufficient documentation

## 2014-01-01 DIAGNOSIS — R6889 Other general symptoms and signs: Secondary | ICD-10-CM

## 2014-01-01 DIAGNOSIS — F17200 Nicotine dependence, unspecified, uncomplicated: Secondary | ICD-10-CM | POA: Insufficient documentation

## 2014-01-01 DIAGNOSIS — Z8719 Personal history of other diseases of the digestive system: Secondary | ICD-10-CM | POA: Insufficient documentation

## 2014-01-01 DIAGNOSIS — I10 Essential (primary) hypertension: Secondary | ICD-10-CM | POA: Insufficient documentation

## 2014-01-01 DIAGNOSIS — Z79899 Other long term (current) drug therapy: Secondary | ICD-10-CM | POA: Insufficient documentation

## 2014-01-01 DIAGNOSIS — Z90710 Acquired absence of both cervix and uterus: Secondary | ICD-10-CM | POA: Insufficient documentation

## 2014-01-01 LAB — I-STAT TROPONIN, ED: Troponin i, poc: 0.01 ng/mL (ref 0.00–0.08)

## 2014-01-01 LAB — BASIC METABOLIC PANEL
BUN: 7 mg/dL (ref 6–23)
CALCIUM: 9.1 mg/dL (ref 8.4–10.5)
CO2: 24 mEq/L (ref 19–32)
CREATININE: 0.76 mg/dL (ref 0.50–1.10)
Chloride: 93 mEq/L — ABNORMAL LOW (ref 96–112)
GFR calc Af Amer: >90 mL/min (ref >90)
GLUCOSE: 97 mg/dL (ref 70–99)
Potassium: 3.9 mEq/L (ref 3.7–5.3)
Sodium: 131 mEq/L — ABNORMAL LOW (ref 137–147)

## 2014-01-01 LAB — CBC
HCT: 36.1 % (ref 36.0–46.0)
Hemoglobin: 12.1 g/dL (ref 12.0–15.0)
MCH: 25.6 pg — AB (ref 26.0–34.0)
MCHC: 33.5 g/dL (ref 30.0–36.0)
MCV: 76.5 fL — ABNORMAL LOW (ref 78.0–100.0)
PLATELETS: 282 10*3/uL (ref 150–400)
RBC: 4.72 MIL/uL (ref 3.87–5.11)
RDW: 19.4 % — ABNORMAL HIGH (ref 11.5–15.5)
WBC: 4.8 10*3/uL (ref 4.0–10.5)

## 2014-01-01 MED ORDER — ONDANSETRON HCL 4 MG/2ML IJ SOLN
4.0000 mg | Freq: Once | INTRAMUSCULAR | Status: AC
  Administered 2014-01-01: 4 mg via INTRAVENOUS
  Filled 2014-01-01: qty 2

## 2014-01-01 MED ORDER — ACETAMINOPHEN 325 MG PO TABS
650.0000 mg | ORAL_TABLET | Freq: Once | ORAL | Status: AC
  Administered 2014-01-01: 650 mg via ORAL
  Filled 2014-01-01: qty 2

## 2014-01-01 MED ORDER — MORPHINE SULFATE 4 MG/ML IJ SOLN
4.0000 mg | Freq: Once | INTRAMUSCULAR | Status: AC
  Administered 2014-01-01: 4 mg via INTRAVENOUS
  Filled 2014-01-01: qty 1

## 2014-01-01 MED ORDER — SODIUM CHLORIDE 0.9 % IV BOLUS (SEPSIS)
1000.0000 mL | Freq: Once | INTRAVENOUS | Status: AC
  Administered 2014-01-01: 1000 mL via INTRAVENOUS

## 2014-01-01 NOTE — ED Notes (Signed)
Pt reports productive cough, body aches, upper cp with deep inhalation and abd pain that started on Monday. Pt states sputum is thick and white in color. Pt denies being around anyone who has been sick. Pt rates pain 8/10, states when she coughs pain gets worse and she splints with a pillow.

## 2014-01-01 NOTE — ED Notes (Signed)
Pt arrived from home via [facility] c/o abdominal pain, and CP with productive cough x 3days. Per EMS has been spitting up phlegm. EMS VS WNL. Hx HTN,

## 2014-01-01 NOTE — ED Provider Notes (Signed)
CSN: [id number]     Arrival date & time 01/01/14  2038 History   First MD Initiated Contact with Patient 01/01/14 2209     Chief Complaint  Patient presents with  . Chest Pain     (Consider location/radiation/quality/duration/timing/severity/associated sxs/prior Treatment) HPI Comments: Lisa [name] is a 58 y.o. female with a past medical history of HTN, MVP, EtOH abuse, presenting the Emergency Department with a chief complaint of chest discomfort for 3 days.  The patient reports chest discomfort with coughing. The patient reports productive cough for several days.  She reports abdominal discomfort for 3 days with multiple episodes of emesis 2 nights ago. She reports chills, nasal congestion, and headache. Reports last cocaine use 2 weeks ago.   No cardiologist PCP Melrose Nakayama, MD   Patient is a 58 y.o. female presenting with chest pain. The history is provided by the patient and medical records. No language interpreter was used.  Chest Pain Associated symptoms: abdominal pain, cough, headache, shortness of breath and vomiting   Associated symptoms: no fever     Past Medical History  Diagnosis Date  . Osteopetrosis   . Hepatitis C   . Tobacco use   . Alcohol abuse, daily use   . HTN (hypertension)   . Poor dental hygiene   . Mitral valve prolapse    Past Surgical History  Procedure Laterality Date  . Abdominal hysterectomy    . Laparoscopy    . Rod in right arm    . Carpal tunnel release     Family History  Problem Relation Age of Onset  . Cancer Father     Prostate   History  Substance Use Topics  . Smoking status: Current Every Day Smoker -- 0.50 packs/day    Types: Cigarettes  . Smokeless tobacco: Not on file  . Alcohol Use: Yes     Comment: Drinks daily, two 40 oz beer daily   OB History   Grav Para Term Preterm Abortions TAB SAB Ect Mult Living   1 1 1       1      Review of Systems  Constitutional: Positive for chills. Negative for fever.  HENT:  Positive for congestion and rhinorrhea.   Respiratory: Positive for cough and shortness of breath.   Cardiovascular: Positive for chest pain.  Gastrointestinal: Positive for vomiting and abdominal pain.  Neurological: Positive for headaches.      Allergies  Celecoxib and Sulfonamide derivatives  Home Medications   Current Outpatient Rx  Name  Route  Sig  Dispense  Refill  . cyclobenzaprine (FLEXERIL) 5 MG tablet   Oral   Take 1 tablet (5 mg total) by mouth 3 (three) times daily as needed for muscle spasms.   20 tablet   0   . desloratadine (CLARINEX) 5 MG tablet   Oral   Take 1 tablet (5 mg total) by mouth daily.   30 tablet   1   . docusate sodium (COLACE) 100 MG capsule   Oral   Take 1 capsule (100 mg total) by mouth 2 (two) times daily as needed for constipation.   60 capsule   0   . hydrOXYzine (VISTARIL) 25 MG capsule   Oral   Take 1 capsule (25 mg total) by mouth 3 (three) times daily as needed for itching.   90 capsule   0   . ketoconazole (NIZORAL) 2 % shampoo   Topical   Apply 1 application topically 2 (two) times a week.  120 mL   0   . lisinopril (PRINIVIL,ZESTRIL) 20 MG tablet   Oral   Take 1 tablet (20 mg total) by mouth daily.   90 tablet   3   . mupirocin ointment (BACTROBAN) 2 %   Topical   Apply topically 3 (three) times daily.   22 g   0   . traMADol (ULTRAM) 50 MG tablet   Oral   Take 50 mg by mouth every 8 (eight) hours as needed for moderate pain.         Marland Kitchen triamcinolone cream (KENALOG) 0.1 %   Topical   Apply topically 2 (two) times daily.   30 g   0    BP 152/117  Pulse 118  Temp(Src) 100.9 F (38.3 C) (Oral)  Resp 20  SpO2 100% Physical Exam  Nursing note and vitals reviewed. Constitutional: She is oriented to person, place, and time. She appears well-developed and well-nourished.  HENT:  Head: Normocephalic and atraumatic.  Eyes: EOM are normal. Pupils are equal, round, and reactive to light.  Neck: Neck  supple.  Cardiovascular: Regular rhythm.  Tachycardia present.   Murmur heard. No lower extremity edema  Pulmonary/Chest: Effort normal. Not tachypneic. No respiratory distress. She has no decreased breath sounds. She has no wheezes. She has no rhonchi. She has no rales.  Patient is able to speak in complete sentences.    Abdominal: Soft. There is generalized tenderness. There is no rebound and no guarding.  Neurological: She is alert and oriented to person, place, and time.  Skin: Skin is warm and dry.  Psychiatric: She has a normal mood and affect. Her behavior is normal. Thought content normal.    ED Course  Procedures (including critical care time) Labs Review Labs Reviewed  BASIC METABOLIC PANEL - Abnormal; Notable for the following:    Sodium 131 (*)    Chloride 93 (*)    All other components within normal limits  CBC - Abnormal; Notable for the following:    MCV 76.5 (*)    MCH 25.6 (*)    RDW 19.4 (*)    All other components within normal limits  Randolm Idol, ED   Imaging Review Dg Chest 2 View  01/01/2014   CLINICAL DATA:  Chest pain.  EXAM: CHEST  2 VIEW  COMPARISON:  DG CHEST 2 VIEW dated 08/08/2012  FINDINGS: The cardiac silhouette is upper limits of normal in size, unchanged. Mediastinal silhouette is nonsuspicious. Mild similar chronic interstitial changes. The lungs are otherwise clear without pleural effusions or focal consolidations. Trachea projects midline and there is no pneumothorax. Soft tissue planes and included osseous structures are non-suspicious. Mild upper thoracic degenerative change.  IMPRESSION: Borderline cardiomegaly. Mild chronic interstitial changes may reflect COPD without superimposed acute cardiopulmonary process.   Electronically Signed   By: Elon Alas   On: 01/01/2014 22:06     EKG Interpretation   Date/Time:  Wednesday January 01 2014 21:16:51 EDT Ventricular Rate:  111 PR Interval:  116 QRS Duration: 76 QT Interval:   348 QTC Calculation: 473 R Axis:   72 Text Interpretation:  Sinus tachycardia Nonspecific ST abnormality No  significant change since last tracing Confirmed by OPITZ  MD, BRIAN  ([id number]) on 01/02/2014 12:46:28 AM      MDM   Final diagnoses:  Flu-like symptoms   Pt with flu like symptoms, initial temp 99.1, will repeat temperature and treat symptomatically. No wheezing or rhonchi on exam. She does have MVP and is  not followed by Cardiology.  Chest discomfort with cough, low HEART score will rule out ACS. EKG shows nonspecific ST abnormalities, no significant changes from prior. Negative troponin BMp shows hyponatremia 131, hypochloremic 93, fluid given.   RN reports temp 100.9, tylenol ordered.  0045 Re-eval Pt reports chest discomfort improvement.  Reports persistent headache, Toradol ordered. Troponin x2 negative Discussed lab results, imaging results, and treatment plan with the patient. Return precautions given. Reports understanding and no other concerns at this time.  Patient is stable for discharge at this time.  Meds given in ED:  Medications  sodium chloride 0.9 % bolus 1,000 mL (0 mLs Intravenous Stopped 01/02/14 0016)  morphine 4 MG/ML injection 4 mg (4 mg Intravenous Given 01/01/14 2307)  ondansetron (ZOFRAN) injection 4 mg (4 mg Intravenous Given 01/01/14 2307)  acetaminophen (TYLENOL) tablet 650 mg (650 mg Oral Given 01/01/14 2307)  ketorolac (TORADOL) 30 MG/ML injection 30 mg (30 mg Intravenous Given 01/02/14 0104)    Discharge Medication List as of 01/02/2014  1:43 AM    START taking these medications   Details  acetaminophen (TYLENOL) 325 MG tablet Take 2 tablets (650 mg total) by mouth every 6 (six) hours as needed., Starting 01/02/2014, Until Discontinued, Print    oseltamivir (TAMIFLU) 75 MG capsule Take 1 capsule (75 mg total) by mouth every 12 (twelve) hours., Starting 01/02/2014, Until Discontinued, Print          Lorrine Kin, PA-C 01/03/14 1425

## 2014-01-02 LAB — I-STAT TROPONIN, ED: Troponin i, poc: 0.02 ng/mL (ref 0.00–0.08)

## 2014-01-02 MED ORDER — KETOROLAC TROMETHAMINE 30 MG/ML IJ SOLN
30.0000 mg | Freq: Once | INTRAMUSCULAR | Status: AC
  Administered 2014-01-02: 30 mg via INTRAVENOUS
  Filled 2014-01-02: qty 1

## 2014-01-02 MED ORDER — ACETAMINOPHEN 325 MG PO TABS: 650.0000 mg | ORAL_TABLET | Freq: Four times a day (QID) | ORAL | Status: DC | PRN

## 2014-01-02 MED ORDER — OSELTAMIVIR PHOSPHATE 75 MG PO CAPS: 75.0000 mg | ORAL_CAPSULE | Freq: Two times a day (BID) | ORAL | Status: DC

## 2014-01-02 NOTE — ED Notes (Signed)
Discharge and follow up reviewed. Pt verbalized understanding.  

## 2014-01-02 NOTE — Discharge Instructions (Signed)
Call for a follow up appointment with a Family or Primary Care Provider.  Return if Symptoms worsen.   Take medication as prescribed.  Take tylenol for fever.

## 2014-01-02 NOTE — ED Notes (Signed)
Pt walked about 120 feet.  Pt stated no dizziness. Sp02 was 97%

## 2014-01-10 NOTE — ED Provider Notes (Signed)
Medical screening examination/treatment/procedure(s) were performed by non-physician practitioner and as supervising physician I was immediately available for consultation/collaboration.   EKG Interpretation   Date/Time:  Wednesday January 01 2014 21:16:51 EDT Ventricular Rate:  111 PR Interval:  116 QRS Duration: 76 QT Interval:  348 QTC Calculation: 473 R Axis:   72 Text Interpretation:  Sinus tachycardia Nonspecific ST abnormality No  significant change since last tracing Confirmed by OPITZ  MD, BRIAN  [phone]) on 01/02/2014 12:46:28 AM       Leota Jacobsen, MD 01/10/14 [phone]

## 2014-01-14 ENCOUNTER — Ambulatory Visit: Payer: No Typology Code available for payment source | Admitting: Family Medicine

## 2014-01-21 ENCOUNTER — Ambulatory Visit (INDEPENDENT_AMBULATORY_CARE_PROVIDER_SITE_OTHER): Payer: No Typology Code available for payment source | Admitting: Family Medicine

## 2014-01-21 ENCOUNTER — Encounter: Payer: Self-pay | Admitting: Family Medicine

## 2014-01-21 VITALS — BP 189/93 | HR 89 | Temp 98.1°F | Ht 63.0 in | Wt 124.0 lb

## 2014-01-21 DIAGNOSIS — H547 Unspecified visual loss: Secondary | ICD-10-CM

## 2014-01-21 DIAGNOSIS — R059 Cough, unspecified: Secondary | ICD-10-CM

## 2014-01-21 DIAGNOSIS — R05 Cough: Secondary | ICD-10-CM

## 2014-01-21 DIAGNOSIS — L299 Pruritus, unspecified: Secondary | ICD-10-CM

## 2014-01-21 MED ORDER — GUAIFENESIN-CODEINE 100-10 MG/5ML PO SYRP: 5.0000 mL | ORAL_SOLUTION | Freq: Every evening | ORAL | Status: DC | PRN

## 2014-01-21 NOTE — Progress Notes (Signed)
Patient ID: Lisa [name], female   DOB: December 26, 1955, 58 y.o.   MRN: [id number]    Subjective: HPI: Patient is a 58 y.o. female presenting to clinic today for ED follow up. Concerns today include head itching  1. Cough- Was seen in the ED 2 weeks ago for fatigue, cough and chest pain. Diagnosed with influenza, didn't take Tamiflu. She is still coughing. Productive of yellow phlegm. Still smoking. Reports some wheezing. Reports some nasal congestion after cough. No fevers currently.  2. Head itching- Reports some head itching. Thinks she has lice. She has tried Rid-X. Used Nizoral shampoo which did not help.  3. Decreased vision- Patient concerned that her vision is poor and she continues to have milky discharge from her eyes. No itching or redness.   History Reviewed: Daily smoker.  ROS: Please see HPI above.  Objective: Office vital signs reviewed. BP 189/93  Pulse 89  Temp(Src) 98.1 F (36.7 C) (Oral)  Ht 5\' 3"  (1.6 m)  Wt 124 lb (56.246 kg)  BMI 21.97 kg/m2  Physical Examination:  General: Awake, alert. NAD HEENT: Atraumatic, normocephalic. Posterior pharynx erythema. TM wnl. Pupils equal and reactive, no discharge noted. Scalp dry, flaky but no lice or nits. Neck: No masses palpated. No LAD Pulm: CTAB, no wheezes Cardio: RRR, no murmurs appreciated Abdomen:+BS, soft, nontender, nondistended Extremities: No edema Neuro: Grossly intact  Assessment: 58 y.o. female follow up appointment  Plan: See Problem List and After Visit Summary

## 2014-01-21 NOTE — Patient Instructions (Signed)
You do not have lice. Use Selsun blue shampoo for dry scalp.  We will send you to the eye doctor to be seen.  Use the prescription cough medication to see if that will help. If not, call me and I will send in an antibiotic. STOP SMOKING.  Deshay Kirstein M. Tykwon Fera, M.D.

## 2014-01-22 DIAGNOSIS — R05 Cough: Secondary | ICD-10-CM | POA: Insufficient documentation

## 2014-01-22 DIAGNOSIS — R059 Cough, unspecified: Secondary | ICD-10-CM | POA: Insufficient documentation

## 2014-01-22 DIAGNOSIS — L299 Pruritus, unspecified: Secondary | ICD-10-CM | POA: Insufficient documentation

## 2014-01-22 DIAGNOSIS — H547 Unspecified visual loss: Secondary | ICD-10-CM | POA: Insufficient documentation

## 2014-01-22 NOTE — Assessment & Plan Note (Signed)
A: Most likely from post-nasal drip. Lungs CTA. No distress.  P: - Robitussin AC - No abx at this time, con't to monitor - Stop smoking - Consider PFT or inhaler if not resolving

## 2014-01-22 NOTE — Assessment & Plan Note (Signed)
A: No lice, only dandruff.  P: - Reassurance - Selsun blue shampoo - F/u prn

## 2014-01-22 NOTE — Assessment & Plan Note (Signed)
A: No milky discharge appreciated, but patient concerned.  P: - Refer to eye doctor for evaluation - Return as needed

## 2014-03-31 ENCOUNTER — Encounter: Payer: Self-pay | Admitting: Family Medicine

## 2014-03-31 ENCOUNTER — Ambulatory Visit (INDEPENDENT_AMBULATORY_CARE_PROVIDER_SITE_OTHER): Payer: No Typology Code available for payment source | Admitting: Family Medicine

## 2014-03-31 VITALS — BP 120/80 | HR 80 | Temp 98.6°F | Ht 63.0 in | Wt 116.0 lb

## 2014-03-31 DIAGNOSIS — M791 Myalgia, unspecified site: Secondary | ICD-10-CM

## 2014-03-31 DIAGNOSIS — R079 Chest pain, unspecified: Secondary | ICD-10-CM | POA: Insufficient documentation

## 2014-03-31 DIAGNOSIS — R0602 Shortness of breath: Secondary | ICD-10-CM

## 2014-03-31 DIAGNOSIS — IMO0001 Reserved for inherently not codable concepts without codable children: Secondary | ICD-10-CM

## 2014-03-31 DIAGNOSIS — M79609 Pain in unspecified limb: Secondary | ICD-10-CM

## 2014-03-31 DIAGNOSIS — B1920 Unspecified viral hepatitis C without hepatic coma: Secondary | ICD-10-CM

## 2014-03-31 DIAGNOSIS — M79659 Pain in unspecified thigh: Secondary | ICD-10-CM | POA: Insufficient documentation

## 2014-03-31 DIAGNOSIS — Z8619 Personal history of other infectious and parasitic diseases: Secondary | ICD-10-CM

## 2014-03-31 LAB — CBC
HCT: 35.9 % — ABNORMAL LOW (ref 36.0–46.0)
HEMOGLOBIN: 11.5 g/dL — AB (ref 12.0–15.0)
MCH: 26.3 pg (ref 26.0–34.0)
MCHC: 32 g/dL (ref 30.0–36.0)
MCV: 82.2 fL (ref 78.0–100.0)
Platelets: 281 10*3/uL (ref 150–400)
RBC: 4.37 MIL/uL (ref 3.87–5.11)
RDW: 18.1 % — ABNORMAL HIGH (ref 11.5–15.5)
WBC: 4.3 10*3/uL (ref 4.0–10.5)

## 2014-03-31 MED ORDER — GABAPENTIN 100 MG PO CAPS: 100.0000 mg | ORAL_CAPSULE | Freq: Three times a day (TID) | ORAL | Status: DC

## 2014-03-31 NOTE — Progress Notes (Signed)
Patient ID: Lisa [name], female   DOB: 09/10/1956, 58 y.o.   MRN: [id number]  Tommi Rumps, MD Phone: [phone]  Lisa [name] is a 58 y.o. female who presents today for same day appointment.  Thigh pain: notes this has been present for a long time, since she last saw Dr Sheral Apley. Notes pain in both thighs in her quads. Described as a needle sensation and vibratory sensation. She notes that the pain is sometimes so bad that it feels as though her legs are going to give out on her. She has previously taken OTC pain medication and tramadol for this. The tramadol helped but made her sleepy. She notes some right low back pain as well. She denies bowel changes, she notes she urinates a lot at night. She denies fevers and history of cancer. She denies saddle anesthesia. She endorses a history of disc protrusion seen on imaging. She also notes the pain often occurs after having walked for 15 minutes, though she notes it can occur at any time as well. Improves after stopping walking. She notes her mother had vascular issues in her legs and had amputations related to this.  Hepatitis C: patient reports diagnosis of hep C when in prison in 2006. States has not had labs for this in some time. Notes intermittent abdominal pain. She has never seen a GI or ID specialist for this. She has not received treatment for this.   Chest pain: patient reports on review of systems after physician discovered heart murmur left sided chest pressure that occurs intermittently. This occurs 2-3x/month. At first she reports no radiation, then states it radiates to the left shoulder intermittently. She does have shortness of breath with this and some intermittent diaphoresis with this. She notes getting short of breath easily when walking around. The pain typically occurs when she is at rest. It will worsen if she gets up while having the pain, though notes that the pain does not initiate when she is active. She denies pain at this  time. She previously had a stress test in 2011 that was normal. She has not seen a cardiologist previously.  Patient is a smoker.   ROS: Per HPI   Physical Exam Filed Vitals:   03/31/14 1505  BP: 120/80  Pulse: 80  Temp: 98.6 F (37 C)    Gen: Well NAD HEENT: PERRL,  MMM Lungs: CTABL Nl WOB Heart: RRR 3/6 systolic murmur Abd: soft, mild RUQ tenderness, no guarding or rebound, ND MSK: no tenderness to palpation of quad muscles, patient with mild tenderness to palpation of the right lumbar paraspinous muscles, no midline tenderness to palpation Neuro: 5/5 strength in bilateral hip flexors, quads, hamstrings, plantar and dorsiflexion, sensation to light touch intact in bilateral LE, 2+ patellar reflexes bilaterally Exts: Non edematous BL  LE, warm and well perfused. 2+ DP pulses.   Assessment/Plan: Please see individual problem list.  # Healthcare maintenance: needs colonoscopy, Tdap, and mammogram

## 2014-03-31 NOTE — Assessment & Plan Note (Addendum)
Patient with bilateral pain in quads. They are currently non-tender. She is intact neurologically and vascularly. No red flags on exam or history. The pain sounds typical of neuropathic pain, possibly related to reported disc protrusion. Will order CK, CMET, CBC, TSH to rule out additional causes of this pain. Will start on gabapentin 100 mg TID for this pain. Consider ABIs if this laboratory work up is negative. F/u in 2 weeks.

## 2014-03-31 NOTE — Patient Instructions (Signed)
Nice to meet you. We will check some labs today. We will call with the results. If you have recurrence of this chest pain and shortness of breath that does not improve with rest please go to the emergency room.  We will start you on a new medication for the pain in your legs. This can make you drowsy, so please be careful the first time you take this. I will see you back in 2 weeks for follow-up.  Shortness of Breath Shortness of breath means you have trouble breathing. Shortness of breath needs medical care right away. HOME CARE   Do not smoke.  Avoid being around chemicals or things (paint fumes, dust) that may bother your breathing.  Rest as needed. Slowly begin your normal activities.  Only take medicines as told by your doctor.  Keep all doctor visits as told. GET HELP RIGHT AWAY IF:   Your shortness of breath gets worse.  You feel lightheaded, pass out (faint), or have a cough that is not helped by medicine.  You cough up blood.  You have pain with breathing.  You have pain in your chest, arms, shoulders, or belly (abdomen).  You have a fever.  You cannot walk up stairs or exercise the way you normally do.  You do not get better in the time expected.  You have a hard time doing normal activities even with rest.  You have problems with your medicines.  You have any new symptoms. MAKE SURE YOU:  Understand these instructions.  Will watch your condition.  Will get help right away if you are not doing well or get worse. Document Released: 02/29/2008 Document Revised: 09/17/2013 Document Reviewed: 11/28/2011 [facility] Patient Information 2015 [place], [place]. This information is not intended to replace advice given to you by your health care provider. Make sure you discuss any questions you have with your health care provider.

## 2014-03-31 NOTE — Assessment & Plan Note (Signed)
No prior labs to document that she has this infection. Will check Hep C Ab and Hep B surface Ab now. If positive will consider further work-up and possible referral to ID for potential treatment of this issue. CMET to be obtained as well. F/u in 2 weeks.

## 2014-03-31 NOTE — Assessment & Plan Note (Addendum)
Patient with report of intermittent chest pain. She does have typical features of this chest pain. She notes shortness of breath and fatigue easily as well. On review of prior labs it appears that she has been anemic in the past which could account for much of these symptoms. Will order CBC and TSH to work-up the fatigue and shortness of breath to see if these are contributing to the chest pain. If they are normal and patient continues to have chest pain will need to refer the patient to cardiology for further evaluation of this issue. Return precautions discussed. F/u in 2 weeks.

## 2014-04-01 LAB — COMPREHENSIVE METABOLIC PANEL
ALK PHOS: 91 U/L (ref 39–117)
ALT: 62 U/L — ABNORMAL HIGH (ref 0–35)
AST: 125 U/L — ABNORMAL HIGH (ref 0–37)
Albumin: 3.2 g/dL — ABNORMAL LOW (ref 3.5–5.2)
BUN: 8 mg/dL (ref 6–23)
CALCIUM: 9.4 mg/dL (ref 8.4–10.5)
CO2: 28 mEq/L (ref 19–32)
CREATININE: 0.72 mg/dL (ref 0.50–1.10)
Chloride: 103 mEq/L (ref 96–112)
Glucose, Bld: 120 mg/dL — ABNORMAL HIGH (ref 70–99)
Potassium: 3.6 mEq/L (ref 3.5–5.3)
Sodium: 136 mEq/L (ref 135–145)
Total Bilirubin: 1.4 mg/dL — ABNORMAL HIGH (ref 0.2–1.2)
Total Protein: 9 g/dL — ABNORMAL HIGH (ref 6.0–8.3)

## 2014-04-01 LAB — HEPATITIS C ANTIBODY: HCV Ab: REACTIVE — AB

## 2014-04-01 LAB — CK: Total CK: 114 U/L (ref 7–177)

## 2014-04-01 LAB — TSH: TSH: 0.605 u[IU]/mL (ref 0.350–4.500)

## 2014-04-01 LAB — HEPATITIS B SURFACE ANTIBODY,QUALITATIVE: HEP B S AB: NEGATIVE

## 2014-04-02 ENCOUNTER — Other Ambulatory Visit: Payer: Self-pay | Admitting: Family Medicine

## 2014-04-02 ENCOUNTER — Telehealth: Payer: Self-pay | Admitting: *Deleted

## 2014-04-02 DIAGNOSIS — B182 Chronic viral hepatitis C: Secondary | ICD-10-CM

## 2014-04-02 MED ORDER — FERROUS SULFATE 324 (65 FE) MG PO TBEC: 1.0000 | DELAYED_RELEASE_TABLET | Freq: Every day | ORAL | Status: DC

## 2014-04-02 NOTE — Telephone Encounter (Signed)
Pt is aware of hep c results.  Told me that she would have to call back to get the rest of message and schedule lab appt. Josiah Wojtaszek,CMA

## 2014-04-02 NOTE — Telephone Encounter (Signed)
Message copied by Valerie Roys on Wed Apr 02, 2014  9:04 AM ------      Message from: Caryl Bis, ERIC G      Created: Wed Apr 02, 2014  8:51 AM       Patient noted to have positive Hep C antibody. This was expected given the patients report of history of hep C. I would like her to come back in to have a viral load checked. I will place that order and she can come in for a lab appointment. Her hemoglobin is minimally low as well. This could be contributing to her fatigue. I would like her to try a trial of iron supplementation. Please inform her of these things. I will see her back as already scheduled on 04/21/14. Thanks. ------

## 2014-04-21 ENCOUNTER — Encounter: Payer: Self-pay | Admitting: Family Medicine

## 2014-04-21 ENCOUNTER — Ambulatory Visit (INDEPENDENT_AMBULATORY_CARE_PROVIDER_SITE_OTHER): Payer: No Typology Code available for payment source | Admitting: Family Medicine

## 2014-04-21 ENCOUNTER — Other Ambulatory Visit: Payer: No Typology Code available for payment source

## 2014-04-21 VITALS — BP 184/80 | HR 83 | Temp 97.5°F | Resp 16 | Wt 122.0 lb

## 2014-04-21 DIAGNOSIS — M79659 Pain in unspecified thigh: Secondary | ICD-10-CM

## 2014-04-21 DIAGNOSIS — B1920 Unspecified viral hepatitis C without hepatic coma: Secondary | ICD-10-CM

## 2014-04-21 DIAGNOSIS — M79609 Pain in unspecified limb: Secondary | ICD-10-CM

## 2014-04-21 DIAGNOSIS — R079 Chest pain, unspecified: Secondary | ICD-10-CM

## 2014-04-21 DIAGNOSIS — B182 Chronic viral hepatitis C: Secondary | ICD-10-CM

## 2014-04-21 DIAGNOSIS — I10 Essential (primary) hypertension: Secondary | ICD-10-CM

## 2014-04-21 MED ORDER — AMITRIPTYLINE HCL 25 MG PO TABS: 25.0000 mg | ORAL_TABLET | Freq: Every day | ORAL | Status: DC

## 2014-04-21 MED ORDER — LISINOPRIL 20 MG PO TABS: 20.0000 mg | ORAL_TABLET | Freq: Every day | ORAL | Status: DC

## 2014-04-21 NOTE — Patient Instructions (Signed)
Nice to see you. We are going to have you see cardiology. If you develop chest pain, shortness of breath, sweating, or light headedness that does not resolve shortly after resting, please seek medical attention. We are going to have you see infectious disease about your hepatitis c as well. I sent a new medication for your pain. Take this at night. Please stop the gabapentin.

## 2014-04-21 NOTE — Progress Notes (Signed)
Hepatitis C RNA quantitative drawn and sent to [facility]

## 2014-04-22 NOTE — Assessment & Plan Note (Signed)
Patient with continued intermittent chest pain and shortness of breath that is typical in nature. Will check a lipid panel this Thursday and add a statin accordingly. She is to continue ASA 81 mg daily. Will refer to cardiology for potential stress test. F/u one month.

## 2014-04-22 NOTE — Assessment & Plan Note (Addendum)
Has continued to have pain with gabapentin, though had improvement in vibratory symptoms with gabapentin. Has not been able to tolerate this relating to sedation. Labs were unremarkable at last visit. She continues to be intact neurologically and vascularly. At this time will switch to elavil to see if this is beneficial. If not will consider ABIs to look at vascular cause. F/u in 1 month.

## 2014-04-22 NOTE — Assessment & Plan Note (Signed)
Positive antibody. RNA collected today. Will refer to ID for further eval and management. Hopefully she will be a candidate for treatment. F/u prn.

## 2014-04-22 NOTE — Assessment & Plan Note (Signed)
Blood pressure initially slightly elevated, then elevated even more on recheck. Will continue lisinopril at current dosing and have patient come back to clinic this Thursday for BP check. If still elevated will need to consider addition of another agent or increase of lisinopril.

## 2014-04-22 NOTE — Progress Notes (Signed)
Patient ID: Lisa [name], female   DOB: Jan 10, 1956, 58 y.o.   MRN: [id number]  Tommi Rumps, MD Phone: [phone]  Lisa [name] is a 58 y.o. female who presents today for f/u.  HYPERTENSION Disease Monitoring Home BP Monitoring not checking Chest pain- yes    Dyspnea- yes Medications Compliance-  Taking lisinopril.  Edema- no  Chest pain: notes continued pressure in center of chest. Like someone grabbing and pulling. Occurs 3-4x/week for a "long time." She notes shortness of breath with this. No radiation. Occassional diaphoresis. No orthopnea or PND. This pain is not exertional, though she does have exertional dyspnea. She notes occasional night sweats. No TB contacts. She smokes 8 cigs/day. Takes a baby aspirin daily.  Thigh pain: she notes that the vibrating sense is gone, though the pain is still present. She takes the gabapentin though this makes her sleepy to the point that she was not able to go to work one day last week. She has taken ibuprofen as well without much improvement. She denies changes in bowel or bladder function. No saddle anesthesia. This pain is not worsened by walking.  Hepatitis C: diagnosed in 2006 when in prison. At her last visit we obtained a Hep c ab and this was positive. She was to return to get hep c RNA, though just had this drawn today. Has never been treated for it.    Patient is a smoker.   ROS: Per HPI   Physical Exam BP 152/65 O2 sat 100% P 83 T 97.5 RR 16  Gen: Well NAD HEENT: PERRL,  MMM Lungs: CTABL Nl WOB Heart: RRR 3/6 systolic murmur MSK: no tenderness to palpation of quad muscles, patient with mild tenderness to palpation of the right lumbar paraspinous muscles, no midline tenderness to palpation Neuro: 4+/5 strength in bilateral hip flexors, quads, hamstrings, plantar and dorsiflexion, sensation to light touch intact in bilateral LE, 2+ patellar reflexes bilaterally Exts: Non edematous BL  LE, warm and well perfused. 2+DP  pulses.   Assessment/Plan: Please see individual problem list.  # Healthcare maintenance: need to discuss colonoscopy and mammogram at next visit

## 2014-04-24 ENCOUNTER — Ambulatory Visit: Payer: No Typology Code available for payment source | Admitting: *Deleted

## 2014-04-24 ENCOUNTER — Other Ambulatory Visit: Payer: No Typology Code available for payment source

## 2014-04-24 VITALS — BP 152/84

## 2014-04-24 DIAGNOSIS — I10 Essential (primary) hypertension: Secondary | ICD-10-CM

## 2014-04-24 DIAGNOSIS — R079 Chest pain, unspecified: Secondary | ICD-10-CM

## 2014-04-24 LAB — LIPID PANEL
Cholesterol: 152 mg/dL (ref 0–200)
HDL: 46 mg/dL (ref >39)
LDL Cholesterol: 88 mg/dL (ref 0–99)
Total CHOL/HDL Ratio: 3.3 Ratio
Triglycerides: 88 mg/dL (ref <150)
VLDL: 18 mg/dL (ref 0–40)

## 2014-04-24 LAB — HEPATITIS C RNA QUANTITATIVE
HCV QUANT LOG: 6.06 {Log} — AB (ref <1.18)
HCV QUANT: 1161404 [IU]/mL — AB (ref <15)

## 2014-04-24 NOTE — Progress Notes (Signed)
Drew Lipid Profile

## 2014-04-24 NOTE — Progress Notes (Signed)
Patient in today for blood pressure check. Blood pressure taken manually in the right arm was 152/84. Patient reports not taking any medication today. Instructed patient to pick up rx for lisinopril at pharmacy. FYI to PCP.

## 2014-05-01 ENCOUNTER — Encounter: Payer: Self-pay | Admitting: Family Medicine

## 2014-05-06 ENCOUNTER — Other Ambulatory Visit: Payer: Self-pay

## 2014-05-21 ENCOUNTER — Ambulatory Visit (INDEPENDENT_AMBULATORY_CARE_PROVIDER_SITE_OTHER): Payer: Self-pay | Admitting: Family Medicine

## 2014-05-21 ENCOUNTER — Ambulatory Visit: Payer: Self-pay

## 2014-05-21 ENCOUNTER — Ambulatory Visit (HOSPITAL_COMMUNITY)
Admission: RE | Admit: 2014-05-21 | Discharge: 2014-05-21 | Disposition: A | Discharge status: Home / Self Care | Payer: Self-pay | Source: Ambulatory Visit | Attending: Family Medicine | Admitting: Family Medicine

## 2014-05-21 ENCOUNTER — Encounter: Payer: Self-pay | Admitting: Family Medicine

## 2014-05-21 VITALS — BP 149/80 | HR 63 | Temp 98.0°F | Ht 63.0 in | Wt 123.0 lb

## 2014-05-21 DIAGNOSIS — M549 Dorsalgia, unspecified: Secondary | ICD-10-CM | POA: Insufficient documentation

## 2014-05-21 DIAGNOSIS — I10 Essential (primary) hypertension: Secondary | ICD-10-CM

## 2014-05-21 DIAGNOSIS — M545 Low back pain, unspecified: Secondary | ICD-10-CM

## 2014-05-21 DIAGNOSIS — I160 Hypertensive urgency: Secondary | ICD-10-CM | POA: Insufficient documentation

## 2014-05-21 LAB — CBC
HCT: 34.7 % — ABNORMAL LOW (ref 36.0–46.0)
Hemoglobin: 11.3 g/dL — ABNORMAL LOW (ref 12.0–15.0)
MCH: 26.9 pg (ref 26.0–34.0)
MCHC: 32.6 g/dL (ref 30.0–36.0)
MCV: 82.6 fL (ref 78.0–100.0)
PLATELETS: 316 10*3/uL (ref 150–400)
RBC: 4.2 MIL/uL (ref 3.87–5.11)
RDW: 18.5 % — ABNORMAL HIGH (ref 11.5–15.5)
WBC: 5 10*3/uL (ref 4.0–10.5)

## 2014-05-21 MED ORDER — CYCLOBENZAPRINE HCL 5 MG PO TABS: 5.0000 mg | ORAL_TABLET | Freq: Three times a day (TID) | ORAL | Status: DC | PRN

## 2014-05-21 MED ORDER — NAPROXEN 500 MG PO TABS
500.0000 mg | ORAL_TABLET | Freq: Two times a day (BID) | ORAL | Status: DC
Start: 2014-05-21 — End: 2015-02-11

## 2014-05-21 MED ORDER — CLONIDINE HCL 0.1 MG PO TABS
0.2000 mg | ORAL_TABLET | Freq: Once | ORAL | Status: AC
  Administered 2014-05-21: 0.2 mg via ORAL

## 2014-05-21 MED ORDER — LISINOPRIL 20 MG PO TABS: 20.0000 mg | ORAL_TABLET | Freq: Every day | ORAL | Status: DC

## 2014-05-21 NOTE — Progress Notes (Signed)
Lisa [name] is a 58 y.o. female who presents today for R lower back pain that was found to have a BP of 200/90.    Low Back Pain - This has been on going now for several months per patient.  Denies inciting injury but does state she has a history of a herniated disc, but this pain is different.  Pt denies any current bowel/bladder problems, fever, chills, unintentional weight loss, night time awakenings secondary to pain, weakness in one or both legs.  She tried "some medication" Rx to her before that did not work too well but denies any new medications or treatment.    HTN Urgency - Pt found to have pressure of 200/90 on presentation by RN.  Has slight HA but denies any CP/SOB/N/V/D, radiation of pain into her jaw or arm, denies diplopia, blurred vision, tinnitus, ataxia, weakness, or sensation changes, dysarthria, dysphagia, or aphasia.  She has hx of high blood pressure and states she has not taken her lisinopril for "some time" due to cost and not being covered at health department.      Past Medical History  Diagnosis Date  . Osteopetrosis   . Hepatitis C   . Tobacco use   . Alcohol abuse, daily use   . HTN (hypertension)   . Poor dental hygiene   . Mitral valve prolapse     History  Smoking status  . Current Every Day Smoker -- 0.50 packs/day  . Types: Cigarettes  Smokeless tobacco  . Not on file    Family History  Problem Relation Age of Onset  . Cancer Father     Prostate    Current Outpatient Prescriptions on File Prior to Visit  Medication Sig Dispense Refill  . acetaminophen (TYLENOL) 325 MG tablet Take 2 tablets (650 mg total) by mouth every 6 (six) hours as needed.  30 tablet  0  . amitriptyline (ELAVIL) 25 MG tablet Take 1 tablet (25 mg total) by mouth at bedtime.  30 tablet  1  . desloratadine (CLARINEX) 5 MG tablet Take 1 tablet (5 mg total) by mouth daily.  30 tablet  1  . docusate sodium (COLACE) 100 MG capsule Take 1 capsule (100 mg total) by mouth 2 (two)  times daily as needed for constipation.  60 capsule  0  . ferrous sulfate 324 (65 FE) MG TBEC Take 1 tablet (325 mg total) by mouth daily.  30 tablet  3  . guaiFENesin-codeine (ROBITUSSIN AC) 100-10 MG/5ML syrup Take 5 mLs by mouth at bedtime as needed for cough.  120 mL  0  . hydrOXYzine (VISTARIL) 25 MG capsule Take 1 capsule (25 mg total) by mouth 3 (three) times daily as needed for itching.  90 capsule  0  . ketoconazole (NIZORAL) 2 % shampoo Apply 1 application topically 2 (two) times a week.  120 mL  0  . mupirocin ointment (BACTROBAN) 2 % Apply topically 3 (three) times daily.  22 g  0  . triamcinolone cream (KENALOG) 0.1 % Apply topically 2 (two) times daily.  30 g  0   No current facility-administered medications on file prior to visit.    ROS: Per HPI.  All other systems reviewed and are negative.   Physical Exam Filed Vitals:   05/21/14 1429  BP: 149/80  Pulse: 63  Temp:     Physical Examination: General appearance - alert, well appearing, and in no distress Eyes - No fundoscopic abnormalities noted, no blurring of disc or papilledema.  Chest - clear to auscultation, no wheezes, rales or rhonchi, symmetric air entry Heart - normal rate and regular rhythm, no murmurs noted Musculoskeletal -   Low Back: no gross deformity, erythema or rashes.  TTP R paraspinal musculature (longissimus/iliocostalis).  Decreased forward and backward bending but normal SB/R.  MS 5/5.  DTR +2/4 B/L LE and NV intact distally     Chemistry      Component Value Date/Time   NA 136 03/31/2014 1617   K 3.6 03/31/2014 1617   CL 103 03/31/2014 1617   CO2 28 03/31/2014 1617   BUN 8 03/31/2014 1617   CREATININE 0.72 03/31/2014 1617   CREATININE 0.76 01/01/2014 2121      Component Value Date/Time   CALCIUM 9.4 03/31/2014 1617   ALKPHOS 91 03/31/2014 1617   AST 125* 03/31/2014 1617   ALT 62* 03/31/2014 1617   BILITOT 1.4* 03/31/2014 1617     EKG - NSR, No STEMI or TWI

## 2014-05-21 NOTE — Assessment & Plan Note (Addendum)
MSK in nature, no red flags she is describing to me today.  Worse with movement, recommend HEP/flexeril/naproxen (least affecting cardiovascular NSAID, discussed benefits and risks and pt would like to take.)  Avoid tylenol in pt as she has some transaminitis and hepatic injury 2/2 her hepatitis C.  Low dose flexeril as well, due to hepatic impairment. F/U PRN.

## 2014-05-21 NOTE — Assessment & Plan Note (Addendum)
Slight HA today that has resolved.  EKG shows no ischemic changes and no changes since last EKG in April 2015.  As well, denies CP, hematuria, blurred vision, upper scapular pain or abdominal pain.  Will obtain CMET today along with CBC to r/o end organ involvement.  Was given 0.2 mg of clonidine which brought her pressure down to 301 systolic and not having any Sx at this point.  Will refill her lisinopril at [facility] which she needs to get filled and restart taking (On generic $4-10 list).  F/U in 2 days to see how doing and explained precautions on when to go to the Emergency Room.  Pt understands.

## 2014-05-21 NOTE — Patient Instructions (Signed)
Please make sure to restart your lisinopril.  We will have you see Dr. Caryl Bis in the next 3-7 days.  If you have any chest pain, shortness of breath, severe headache, changes in vision, please call [phone] or go to the emergency room.  Thanks, Dr.  Awanda Mink

## 2014-05-22 LAB — COMPREHENSIVE METABOLIC PANEL
ALBUMIN: 3.1 g/dL — AB (ref 3.5–5.2)
ALT: 36 U/L — ABNORMAL HIGH (ref 0–35)
AST: 82 U/L — ABNORMAL HIGH (ref 0–37)
Alkaline Phosphatase: 82 U/L (ref 39–117)
BILIRUBIN TOTAL: 1.4 mg/dL — AB (ref 0.2–1.2)
BUN: 7 mg/dL (ref 6–23)
CALCIUM: 8.6 mg/dL (ref 8.4–10.5)
CO2: 27 mEq/L (ref 19–32)
Chloride: 106 mEq/L (ref 96–112)
Creat: 0.54 mg/dL (ref 0.50–1.10)
GLUCOSE: 83 mg/dL (ref 70–99)
Potassium: 3.9 mEq/L (ref 3.5–5.3)
Sodium: 136 mEq/L (ref 135–145)
Total Protein: 8.5 g/dL — ABNORMAL HIGH (ref 6.0–8.3)

## 2014-05-26 ENCOUNTER — Other Ambulatory Visit: Payer: Self-pay

## 2014-05-29 ENCOUNTER — Encounter: Payer: Self-pay | Admitting: Interventional Cardiology

## 2014-06-17 ENCOUNTER — Ambulatory Visit (INDEPENDENT_AMBULATORY_CARE_PROVIDER_SITE_OTHER): Payer: Self-pay | Admitting: *Deleted

## 2014-06-17 VITALS — BP 190/98 | HR 68

## 2014-06-17 DIAGNOSIS — I10 Essential (primary) hypertension: Secondary | ICD-10-CM

## 2014-06-17 NOTE — Progress Notes (Signed)
   Pt in nurse clinic for blood pressure check.  Blood pressure 190/98 manually, heart 68.  Pt stated she recently started having nose bleeds.  Denies any chest pain, SOB, dizziness or headache.  Pt complained of lower back pain; feels like pens sticking in her feet and bilateral elbows.  Pt stated she is taking medication as prescribed.  Pt stated that Elavil causes her to have nightmares.  Pt has an appt 07/07/2014 with PCP.  Pt told she should follow sooner for blood pressure and other complaints.  Precepted with Dr. Nori Riis; ok for same day appt for BP and nose bleeds only; back pain will be addressed at 07/07/2014 office visit.  Pt stated understanding.  Pt told to go to emergency room if she develops chest pain, SOB, dizziness, headache and visual changes.  Will forward to PCP.  Derl Barrow, RN\

## 2014-06-18 ENCOUNTER — Encounter: Payer: Self-pay | Admitting: Family Medicine

## 2014-06-18 ENCOUNTER — Ambulatory Visit (INDEPENDENT_AMBULATORY_CARE_PROVIDER_SITE_OTHER): Payer: Self-pay | Admitting: Family Medicine

## 2014-06-18 VITALS — BP 185/60 | HR 68 | Temp 97.9°F | Ht 63.0 in | Wt 123.0 lb

## 2014-06-18 DIAGNOSIS — R040 Epistaxis: Secondary | ICD-10-CM

## 2014-06-18 DIAGNOSIS — I10 Essential (primary) hypertension: Secondary | ICD-10-CM

## 2014-06-18 LAB — PROTIME-INR
INR: 1.16 (ref <1.50)
Prothrombin Time: 14.8 seconds (ref 11.6–15.2)

## 2014-06-18 MED ORDER — HYDROCHLOROTHIAZIDE 12.5 MG PO CAPS: 12.5000 mg | ORAL_CAPSULE | Freq: Every day | ORAL | Status: DC

## 2014-06-18 MED ORDER — LISINOPRIL 40 MG PO TABS: 40.0000 mg | ORAL_TABLET | Freq: Every day | ORAL | Status: DC

## 2014-06-18 NOTE — Assessment & Plan Note (Signed)
No active bleeding. Patient does have hepatitis C putting her at risk for liver dysfunction and bleeding issues, so will check CBC and INR. Potentially could be related to cocaine use if she is snorting this drug. Will check the above labs and continue to monitor at this time.

## 2014-06-18 NOTE — Patient Instructions (Addendum)
Nice to see you. Please start checking your blood pressure every day. Do this after you have been sitting down for 10-15 minutes. We are going to check some blood work and will call with the results.  We are going to increase your lisinopril and start you on HCTZ.  If you have hives, throat itching, or swelling please go to the ED.

## 2014-06-18 NOTE — Assessment & Plan Note (Signed)
Blood pressure not at goal. Is elevated on current dosing of lisinopril. Given use of cocaine recently will not use beta blocker. Will start on HCTZ 12.5 mg daily. Will increase lisinopril to 40 mg daily. Will check CMET today. F/u in one week for office visit and repeat BMET.

## 2014-06-18 NOTE — Progress Notes (Signed)
Patient ID: Lisa [name], female   DOB: Nov 02, 1955, 59 y.o.   MRN: [id number]  Tommi Rumps, MD Phone: [phone]  Lisa [name] is a 58 y.o. female who presents today for same day appointment.  HYPERTENSION Disease Monitoring Home BP Monitoring not checking at home Chest pain- no    Dyspnea- no Medications Compliance-  Taking lisinopril 20 mg daily.  Edema- no Denies blurry vision, headache at this time, weakness.  Nose bleed: notes this occurred on Saturday. Occurred once and was able to get it to stop, then reoccurred 2 more times shortly after the first occurrence. She notes she tilted her head back to get it to stop as it would continue to bleed if she held her head up right. She has not had any additional bleeding since this occurred. No other history of bleeding. No bleeding from gums.   Patient is a smoker. She endorses using cocaine 2 weeks ago.    ROS: Per HPI   Physical Exam Filed Vitals:   06/18/14 1116  BP: 185/60  Pulse: 68  Temp: 97.9 F (36.6 C)    Gen: Well NAD HEENT: PERRL,  MMM, no abnormalities noticed on otoscopic view on external nares and nasal turbinates Lungs: CTABL Nl WOB Heart: RRR 2/6 systolic murmur noted no RG Neuro: CN 2-12 intact, 5/5 strength in bilateral biceps, triceps, grip, quads, hamstrings, plantar and dorsiflexion, sensation to light touch intact in bilateral UE and LE, normal gait Exts: Non edematous BL  LE, warm and well perfused.    Assessment/Plan: Please see individual problem list.   Tommi Rumps, MD [place] PGY-3

## 2014-06-19 LAB — CBC WITH DIFFERENTIAL/PLATELET
BASOS PCT: 0 % (ref 0–1)
Basophils Absolute: 0 10*3/uL (ref 0.0–0.1)
EOS PCT: 1 % (ref 0–5)
Eosinophils Absolute: 0.1 10*3/uL (ref 0.0–0.7)
HCT: 36.2 % (ref 36.0–46.0)
HEMOGLOBIN: 11.9 g/dL — AB (ref 12.0–15.0)
LYMPHS ABS: 2.1 10*3/uL (ref 0.7–4.0)
Lymphocytes Relative: 35 % (ref 12–46)
MCH: 27 pg (ref 26.0–34.0)
MCHC: 32.9 g/dL (ref 30.0–36.0)
MCV: 82.3 fL (ref 78.0–100.0)
MONO ABS: 1 10*3/uL (ref 0.1–1.0)
MONOS PCT: 17 % — AB (ref 3–12)
NEUTROS PCT: 47 % (ref 43–77)
Neutro Abs: 2.8 10*3/uL (ref 1.7–7.7)
Platelets: 299 10*3/uL (ref 150–400)
RBC: 4.4 MIL/uL (ref 3.87–5.11)
RDW: 19.3 % — ABNORMAL HIGH (ref 11.5–15.5)
WBC: 5.9 10*3/uL (ref 4.0–10.5)

## 2014-06-19 LAB — COMPREHENSIVE METABOLIC PANEL
ALK PHOS: 90 U/L (ref 39–117)
ALT: 63 U/L — ABNORMAL HIGH (ref 0–35)
AST: 121 U/L — AB (ref 0–37)
Albumin: 3.3 g/dL — ABNORMAL LOW (ref 3.5–5.2)
BILIRUBIN TOTAL: 1.6 mg/dL — AB (ref 0.2–1.2)
BUN: 9 mg/dL (ref 6–23)
CO2: 25 mEq/L (ref 19–32)
Calcium: 9.7 mg/dL (ref 8.4–10.5)
Chloride: 101 mEq/L (ref 96–112)
Creat: 0.71 mg/dL (ref 0.50–1.10)
GLUCOSE: 87 mg/dL (ref 70–99)
Potassium: 3.6 mEq/L (ref 3.5–5.3)
SODIUM: 134 meq/L — AB (ref 135–145)
Total Protein: 9.4 g/dL — ABNORMAL HIGH (ref 6.0–8.3)

## 2014-06-20 ENCOUNTER — Other Ambulatory Visit: Payer: Self-pay | Admitting: Family Medicine

## 2014-06-20 DIAGNOSIS — B182 Chronic viral hepatitis C: Secondary | ICD-10-CM

## 2014-06-20 DIAGNOSIS — R079 Chest pain, unspecified: Secondary | ICD-10-CM

## 2014-06-26 ENCOUNTER — Encounter: Payer: Self-pay | Admitting: Family Medicine

## 2014-06-27 ENCOUNTER — Ambulatory Visit (INDEPENDENT_AMBULATORY_CARE_PROVIDER_SITE_OTHER): Payer: Self-pay | Admitting: Family Medicine

## 2014-06-27 ENCOUNTER — Encounter: Payer: Self-pay | Admitting: Family Medicine

## 2014-06-27 VITALS — BP 173/81 | HR 84 | Temp 98.7°F | Ht 63.0 in | Wt 124.0 lb

## 2014-06-27 DIAGNOSIS — Z23 Encounter for immunization: Secondary | ICD-10-CM

## 2014-06-27 DIAGNOSIS — I10 Essential (primary) hypertension: Secondary | ICD-10-CM

## 2014-06-27 NOTE — Patient Instructions (Addendum)
Lisa [name],   Thanks for coming in today.   Your blood pressure is still elevated. Please pick up the prescriptions from your previous visit and begin taking them. It is important that you do this to control your blood pressure.   You will be seen by Dr. Caryl Bis for follow up on 10/12. Please keep this appointment.   Nice to meet you today!   Best of luck,   [name], MD Family Medicine - PGY 1 Place appropriate patient instructions regarding hypertension here.

## 2014-06-27 NOTE — Progress Notes (Signed)
  Subjective:    Patient here for follow-up of elevated blood pressure.  She is not exercising and is not adherent to a low-salt diet.  Blood pressure is not well controlled at home. Cardiac symptoms: which she has been referred to cardiology. Patient denies: dyspnea, exertional chest pressure/discomfort, fatigue, irregular heart beat, lower extremity edema and near-syncope. Cardiovascular risk factors: smoking/ tobacco exposure and cocaine abuse most recent use 1 week ago, medication noncompliance. Use of agents associated with hypertension: none and cocaine. History of target organ damage: angina/ prior myocardial infarction. Pt. Is here for follow up after a visit for nose bleed recently. She admits to use of cocaine as recently as 1 week ago. She also reports that she has been unable to pick up her increased doses of her blood pressure medications due to financial restrictions.   # Nose Bleed - Pt. With recent nose bleed. No recurrence of symptoms. She reports snorting cocaine as well as intermittently smoking it.   The following portions of the patient's history were reviewed and updated as appropriate: allergies, current medications, past family history, past medical history, past social history, past surgical history and problem list.  Review of Systems Pertinent items are noted in HPI.     Objective:   Filed Vitals:   06/27/14 1340  BP: 173/81  Pulse: 84  Temp: 98.7 F (37.1 C)  General appearance: alert, cooperative and no distress Head: Normocephalic, without obvious abnormality, atraumatic Eyes: conjunctivae/corneas clear. PERRL, EOM's intact. Fundi benign. Ears: normal TM's and external ear canals both ears Nose: Nares normal. Septum midline. Mucosa normal. No drainage or sinus tenderness. Throat: lips, mucosa, and tongue normal; teeth and gums normal Neck: no adenopathy, no carotid bruit, no JVD, supple, symmetrical, trachea midline and thyroid not enlarged, symmetric, no  tenderness/mass/nodules Lungs: clear to auscultation bilaterally Heart: regular rate and rhythm, S1, S2 normal, no murmur, click, rub or gallop Abdomen: soft, non-tender; bowel sounds normal; no masses,  no organomegaly Extremities: extremities normal, atraumatic, no cyanosis or edema Pulses: 2+ and symmetric Skin: Skin color, texture, turgor normal. No rashes or lesions     Assessment:    Hypertension, stage 2 likely secondary to medication noncompliance and cocaine abuse. Evidence of target organ damage: angina/ prior myocardial infarction.  Plan for follow up with cardiology regarding angina.    Plan:    Medication: continue Lisinopril 40mg  daily and HCTZ 12.5mg  daily once picked up from pharmacy. Check blood pressures weekly and record. Follow up: 1 month and as needed.  - Follow up with PCP Dr. Caryl Bis on 10/12.   # Cocaine Abuse  - discussed drug abuse and cessation - Pt. Not ready to quit at this time.  - Advised of the problems associated with cocaine abuse, and the fact that this probably led to her nose bleed at her last visit.  - Continue to discuss cessation.   # Hepatitis C - Pt. To be followed by ID with upcoming visit for evaluation by them.  - Would continue to follow up their recommendations.   [name], MD Family Medicine - PGY 1 06/27/2014, 4:12 pm.

## 2014-07-02 ENCOUNTER — Other Ambulatory Visit: Payer: Self-pay

## 2014-07-07 ENCOUNTER — Encounter: Payer: Self-pay | Admitting: Family Medicine

## 2014-07-07 ENCOUNTER — Ambulatory Visit (INDEPENDENT_AMBULATORY_CARE_PROVIDER_SITE_OTHER): Payer: Self-pay | Admitting: Family Medicine

## 2014-07-07 VITALS — BP 163/82 | HR 77 | Temp 98.3°F | Ht 63.0 in | Wt 122.0 lb

## 2014-07-07 DIAGNOSIS — I10 Essential (primary) hypertension: Secondary | ICD-10-CM

## 2014-07-07 DIAGNOSIS — L989 Disorder of the skin and subcutaneous tissue, unspecified: Secondary | ICD-10-CM

## 2014-07-07 DIAGNOSIS — R011 Cardiac murmur, unspecified: Secondary | ICD-10-CM

## 2014-07-07 DIAGNOSIS — R238 Other skin changes: Secondary | ICD-10-CM

## 2014-07-07 DIAGNOSIS — M792 Neuralgia and neuritis, unspecified: Secondary | ICD-10-CM | POA: Insufficient documentation

## 2014-07-07 LAB — BASIC METABOLIC PANEL
BUN: 11 mg/dL (ref 6–23)
CO2: 26 mEq/L (ref 19–32)
Calcium: 9.2 mg/dL (ref 8.4–10.5)
Chloride: 102 mEq/L (ref 96–112)
Creat: 0.74 mg/dL (ref 0.50–1.10)
Glucose, Bld: 86 mg/dL (ref 70–99)
POTASSIUM: 4.1 meq/L (ref 3.5–5.3)
SODIUM: 135 meq/L (ref 135–145)

## 2014-07-07 MED ORDER — AMLODIPINE BESYLATE 5 MG PO TABS: 5.0000 mg | ORAL_TABLET | Freq: Every day | ORAL | Status: DC

## 2014-07-07 MED ORDER — TRAMADOL HCL 50 MG PO TABS: 50.0000 mg | ORAL_TABLET | Freq: Three times a day (TID) | ORAL | Status: DC | PRN

## 2014-07-07 MED ORDER — LISINOPRIL-HYDROCHLOROTHIAZIDE 20-12.5 MG PO TABS: 2.0000 | ORAL_TABLET | Freq: Every day | ORAL | Status: DC

## 2014-07-07 NOTE — Assessment & Plan Note (Signed)
Patient with pins and needle sensation in LE. No history of DM and prior blood glucose in the normal range. Will repeat BMET with glucose today. This previously improved with gabapentin though patient was intolerant. Will try tramadol for discomfort. Need to check B12 at next visit as deficiency could be causing some of these symptoms. F/u in one month.

## 2014-07-07 NOTE — Assessment & Plan Note (Signed)
Still not at goal. Will increase HCTZ to 25 mg daily. Add amlodipine 5 mg daily. Continue lisinopril 40 mg daily. BMET today. F/u in one month.

## 2014-07-07 NOTE — Assessment & Plan Note (Signed)
Isolated scalp itching with no obvious abnormalities on exam. Could be related to dry scalp or early seborrheic dermatitis, though no evidence of this on exam. Advised on use of selsun blue shampoo for this. Unlikely to be related to liver dysfunction and build up of bilirubin given isolated itching of the scalp. F/u if not improving.

## 2014-07-07 NOTE — Patient Instructions (Signed)
Nice to see you. We will increase your hydrochlorothiazide to 25 mg. We will add amlodipine as well.

## 2014-07-07 NOTE — Progress Notes (Signed)
Patient ID: Lisa [name], female   DOB: [birth year], 58 y.o.   MRN: [id number]  Tommi Rumps, MD Phone: [phone]  Lisa [name] is a 58 y.o. female who presents today for f/u.  HYPERTENSION Disease Monitoring Home BP Monitoring checking, states systolic is typically 025'K Chest pain- no    Dyspnea- no Medications Compliance-  Taking HCTZ and lisinopril.  Edema- no  Foot pain: patient states bilateral pins and needles sensation in feet. Notes this is still present. Was on gabapentin previously for vibration sensation in feet and legs, though had drowsiness with this. Was to start amitripyline though did not pick this up. Denies decreased sensation in feet. Thinks tramadol will be helpful. No history of DM. Prior blood glucoses in the 80-100 range.  Scalp irritation: notes this has been going on for months. There is itching. She notes this has gotten worse. No dandruff. No apparent rash to the patient. Only itches in scalp. Previously saw Dr Sheral Apley for this per the patient and was advised to try "T gel." No improvement with this.    Patient is a smoker.   ROS: Per HPI   Physical Exam Filed Vitals:   07/07/14 1126  BP: 163/82  Pulse: 77  Temp: 98.3 F (36.8 C)    Gen: Well NAD HEENT: PERRL,  MMM Lungs: CTABL Nl WOB Heart: RRR 3/6 systolic murmur no RG Skin: scalp with no signs of erythema, scaling, dandruff, or lesions Feet: bilateral feet with no erythema or swelling, sensation to light touch is intact, 5/5 strength in bilateral dorsi/plantar flexion, inversion and eversion, 2+ DP pulses Exts: Non edematous BL  LE, warm and well perfused.    Assessment/Plan: Please see individual problem list.   Tommi Rumps, MD [place] PGY-3

## 2014-07-08 ENCOUNTER — Encounter: Payer: Self-pay | Admitting: Family Medicine

## 2014-07-10 ENCOUNTER — Telehealth: Payer: Self-pay | Admitting: Home Health Services

## 2014-07-10 NOTE — Telephone Encounter (Signed)
Olin Hauser LittleDi Kindle Saporito  07/10/2014 11:01:06 AM    I was finally able to make initial contact with pt. today to perform phone assessment. Pt. admitted to me that she is now taking 4-5 Oxycodone per day, as well as smoking marijuana to try and relieve her pain, which she believes is poorly controlled by Palliative Care. Pt. also mentioned that she is now enrolled in Hospice services, but I fear that she may be confusing the two. Pt. would like a call from you, with regards to advocacy of appropriate pain medications. Pt. was denied services through the [facility] because she reported "they found a lot of junk in my system and I admitted to smoking marijuana". Pt. indicated that she is currently receiving counseling and supportive services through a [facility] or [facility] with Hospice. Not sure what services I can offer at this time, especially with the present drug seeking behaviors. I realize pt. must be in a great deal of pain. I feel horrible for her. What a devastating disease. Anyway, I am going to hold off on closing her out at this time, as I hope that I can be of some assistance in the near future. Please let me know how I can assist you with this pt. Thank

## 2014-07-15 ENCOUNTER — Ambulatory Visit (HOSPITAL_COMMUNITY): Payer: Self-pay | Attending: Family Medicine

## 2014-07-24 ENCOUNTER — Encounter: Payer: Self-pay | Admitting: Interventional Cardiology

## 2014-07-24 ENCOUNTER — Ambulatory Visit (INDEPENDENT_AMBULATORY_CARE_PROVIDER_SITE_OTHER): Payer: Self-pay | Admitting: Interventional Cardiology

## 2014-07-24 VITALS — BP 170/90 | HR 74 | Ht 63.0 in | Wt 126.0 lb

## 2014-07-24 DIAGNOSIS — Z720 Tobacco use: Secondary | ICD-10-CM

## 2014-07-24 DIAGNOSIS — R0789 Other chest pain: Secondary | ICD-10-CM

## 2014-07-24 DIAGNOSIS — R011 Cardiac murmur, unspecified: Secondary | ICD-10-CM | POA: Insufficient documentation

## 2014-07-24 DIAGNOSIS — I10 Essential (primary) hypertension: Secondary | ICD-10-CM

## 2014-07-24 NOTE — Patient Instructions (Signed)
Your physician recommends that you continue on your current medications as directed. Please refer to the Current Medication list given to you today.   DR Deloria Lair YOU TO TAKE AMLODIPINE 5MG  DAILY  Your physician has requested that you have an echocardiogram. Echocardiography is a painless test that uses sound waves to create images of your heart. It provides your doctor with information about the size and shape of your heart and how well your heart's chambers and valves are working. This procedure takes approximately one hour. There are no restrictions for this procedure.  Your physician recommends that you schedule a follow-up appointment in: 2 MONTHS WITH DR. VARANASI

## 2014-07-24 NOTE — Progress Notes (Signed)
Patient ID: Lisa [name], female   DOB: 07-Aug-1956, 58 y.o.   MRN: [id number]    [place], [address], [place]  [address] Phone: [phone] Fax:  [phone]  Date:  07/24/2014   ID:  Lisa [name], DOB April 02, 1956, MRN [id number]  PCP:  Tommi Rumps, MD      History of Present Illness: Lisa [name] is a 58 y.o. female who has had difficult to control HTN.  She had a stress test in the past that apparently was normal.  She was scheduled to get an echo a few weeks ago but did not have transportation.    She has had HTN requiring meds for 5- years.  More recently, BP has been high.  First lisinopril / HCTZ was increased.  THen Amlodipne was prescribed but has not been started by the patient.  She gets fatigue and SHOB with walking.  No chest pain.  She has leg pain with walking.  H/o poor circulation in the family- mother and sister.   She can't think of any reasons why her BP went up.  She has stress from bills  As well.  She does not sleep well at night. She deos add salt to food regularly.   Difuse aches all over her body including her chest.  No exertional chest pain.   Wt Readings from Last 3 Encounters:  07/24/14 126 lb (57.153 kg)  07/07/14 122 lb (55.339 kg)  06/27/14 124 lb (56.246 kg)     Past Medical History  Diagnosis Date  . Osteopetrosis   . Hepatitis C   . Tobacco use   . Alcohol abuse, daily use   . HTN (hypertension)   . Poor dental hygiene   . Mitral valve prolapse     Current Outpatient Prescriptions  Medication Sig Dispense Refill  . amLODipine (NORVASC) 5 MG tablet Take 1 tablet (5 mg total) by mouth daily.  90 tablet  1  . cyclobenzaprine (FLEXERIL) 5 MG tablet Take 1 tablet (5 mg total) by mouth 3 (three) times daily as needed for muscle spasms.  30 tablet  0  . desloratadine (CLARINEX) 5 MG tablet Take 1 tablet (5 mg total) by mouth daily.  30 tablet  1  . lisinopril-hydrochlorothiazide (PRINZIDE,ZESTORETIC) 20-12.5 MG per tablet  Take 2 tablets by mouth daily.  180 tablet  1  . naproxen (NAPROSYN) 500 MG tablet Take 1 tablet (500 mg total) by mouth 2 (two) times daily with a meal.  60 tablet  0  . traMADol (ULTRAM) 50 MG tablet Take 1 tablet (50 mg total) by mouth every 8 (eight) hours as needed.  30 tablet  0   No current facility-administered medications for this visit.    Allergies:    Allergies  Allergen Reactions  . Celecoxib Hives  . Sulfonamide Derivatives Hives    Social History:  The patient  reports that she has been smoking Cigarettes.  She has been smoking about 0.50 packs per day. She does not have any smokeless tobacco history on file. She reports that she drinks alcohol. She reports that she uses illicit drugs (Marijuana and Cocaine).   Family History:  The patient's family history includes Cancer in her father; Stroke in her sister. There is no history of Heart attack.   ROS:  Please see the history of present illness.  No nausea, vomiting.  No fevers, chills.  No focal weakness.  No dysuria.    All other systems reviewed and negative.  PHYSICAL EXAM: VS:  BP 170/90  Pulse 74  Ht 5\' 3"  (1.6 m)  Wt 126 lb (57.153 kg)  BMI 22.33 kg/m2  SpO2 98% Well nourished, well developed, in no acute distress HEENT: normal Neck: no JVD, no carotid bruits Cardiac:  normal S1, S2; RRR; 2/6 holosystolic murmur Lungs:  clear to auscultation bilaterally, no wheezing, rhonchi or rales Abd: soft, nontender, no hepatomegaly Ext: no edema Skin: warm and dry Neuro:   no focal abnormalities noted  EKG:  NSR, no ST segment changes    ASSESSMENT AND PLAN:  1. HTN: She needs to start amlodipine.  She could increase to 10 mg daily if her BP is still not controlled. Consider adding beta blocker if BP not controled on full dose amlodipine.   2. Murmur: Plan echocardram. 3. Chest pain: atypical.  Does not sound lke ischemia.  Start with RF modification and ten see how she feels.  4. Tobacco: I recommended that she  stop smoking.   Signed, Mina Marble, MD, [facility] 07/24/2014 10:54 AM

## 2014-07-25 ENCOUNTER — Telehealth: Payer: Self-pay | Admitting: *Deleted

## 2014-07-25 NOTE — Telephone Encounter (Signed)
I will forward this to Constance Holster to see if she knows of resources. To my knowledge [facility] does not help with meals, though will see if Constance Holster knows of additional resources.

## 2014-07-25 NOTE — Telephone Encounter (Signed)
Received call from [facility].  Pt requested a home health referral from the MD there.  She is requesting this for help with "meals and other things around the house."  But this will likely need to come from PCP. Advised I would send a message to MD.  Dr. Caryl Bis,  [facility] pt has charity care, not sure if this is an option but I'm sure Constance Holster would know. [place], Salome Spotted

## 2014-07-28 ENCOUNTER — Encounter: Payer: Self-pay | Admitting: Interventional Cardiology

## 2014-07-30 NOTE — Telephone Encounter (Signed)
CSW left a message for pt.  Hunt Oris, MSW, [place]

## 2014-07-31 ENCOUNTER — Other Ambulatory Visit (HOSPITAL_COMMUNITY): Payer: Self-pay

## 2014-08-07 ENCOUNTER — Ambulatory Visit (HOSPITAL_COMMUNITY): Payer: Self-pay | Attending: Cardiology | Admitting: Radiology

## 2014-08-07 DIAGNOSIS — Z720 Tobacco use: Secondary | ICD-10-CM | POA: Insufficient documentation

## 2014-08-07 DIAGNOSIS — R011 Cardiac murmur, unspecified: Secondary | ICD-10-CM | POA: Insufficient documentation

## 2014-08-07 DIAGNOSIS — I10 Essential (primary) hypertension: Secondary | ICD-10-CM | POA: Insufficient documentation

## 2014-08-07 NOTE — Progress Notes (Signed)
Echocardiogram performed.  

## 2014-08-14 ENCOUNTER — Telehealth: Payer: Self-pay | Admitting: Interventional Cardiology

## 2014-08-14 NOTE — Telephone Encounter (Signed)
I spoke with the patient and made her aware of her echo results. She is still concerned about numbness that she is having that "shifts" around her body. She notices this in the right upper thigh. She also experiences numbness and tingling to her should and fingers. She has chest pain that she states "starts in the left arm and goes up." She has pain in her feet when she touches the floor sometimes. The patient has discussed this with her PCP and that is why she was sent here. I advised that her symptoms could be related to a circulation or nerve issue and that I would forward to Dr. Irish Lack for review. I will be back in touch with her once he reviews.

## 2014-08-14 NOTE — Telephone Encounter (Signed)
pt rtn call to healther, pls call [phone]

## 2014-08-19 NOTE — Telephone Encounter (Signed)
Is BP better since med changes?  I don't think the shifting numbness is heart related.  Would consider stress test if chest pain is persisting.

## 2014-09-01 NOTE — Telephone Encounter (Signed)
I spoke with the patient to follow up on her CP and BP. Her main concern is her circulation. She is having pain in her legs with walking. She is actively smoking. She does not wish to proceed with a stress test at this time as she has had one in the past and did not like the way it made her feel. She was very focused on her circulation and did not comment on her BP readings. She states that her family members have had circulatory problems that require amputation and that there is also a family member with circulation issues.  I offered to schedule her for a lower extremity arterial ultrasound to get a baseline on her lower extremity circulation. She states she is going to have to call us back. I also advised that she could follow up with her PCP for any further work up. Will await a call back from the patient for any further work up.

## 2014-09-30 ENCOUNTER — Ambulatory Visit: Payer: Self-pay | Admitting: Interventional Cardiology

## 2014-10-21 ENCOUNTER — Telehealth: Payer: Self-pay | Admitting: Family Medicine

## 2014-10-21 NOTE — Telephone Encounter (Signed)
Pt called and would like to have a referral to see an eye doctor and a dermatologist. Patient has the orange card. jw

## 2014-10-22 NOTE — Telephone Encounter (Signed)
Pt already has an appt on 11-04-14. Taffany Heiser,CMA

## 2014-10-22 NOTE — Telephone Encounter (Signed)
Patient needs to be evaluated in clinic prior to referral being made. Thanks.

## 2014-11-04 ENCOUNTER — Encounter: Payer: Self-pay | Admitting: Clinical

## 2014-11-04 ENCOUNTER — Ambulatory Visit (INDEPENDENT_AMBULATORY_CARE_PROVIDER_SITE_OTHER): Payer: Self-pay | Admitting: Family Medicine

## 2014-11-04 VITALS — BP 178/78 | HR 86 | Temp 98.5°F | Ht 63.0 in | Wt 125.0 lb

## 2014-11-04 DIAGNOSIS — M792 Neuralgia and neuritis, unspecified: Secondary | ICD-10-CM

## 2014-11-04 DIAGNOSIS — L299 Pruritus, unspecified: Secondary | ICD-10-CM

## 2014-11-04 DIAGNOSIS — R238 Other skin changes: Secondary | ICD-10-CM

## 2014-11-04 DIAGNOSIS — I10 Essential (primary) hypertension: Secondary | ICD-10-CM

## 2014-11-04 DIAGNOSIS — R0789 Other chest pain: Secondary | ICD-10-CM

## 2014-11-04 DIAGNOSIS — J309 Allergic rhinitis, unspecified: Secondary | ICD-10-CM

## 2014-11-04 DIAGNOSIS — L989 Disorder of the skin and subcutaneous tissue, unspecified: Secondary | ICD-10-CM

## 2014-11-04 LAB — COMPREHENSIVE METABOLIC PANEL
ALBUMIN: 3.3 g/dL — AB (ref 3.5–5.2)
ALK PHOS: 98 U/L (ref 39–117)
ALT: 34 U/L (ref 0–35)
AST: 59 U/L — AB (ref 0–37)
BUN: 7 mg/dL (ref 6–23)
CALCIUM: 9.2 mg/dL (ref 8.4–10.5)
CO2: 26 mEq/L (ref 19–32)
CREATININE: 0.58 mg/dL (ref 0.50–1.10)
Chloride: 108 mEq/L (ref 96–112)
Glucose, Bld: 80 mg/dL (ref 70–99)
Potassium: 4.1 mEq/L (ref 3.5–5.3)
Sodium: 134 mEq/L — ABNORMAL LOW (ref 135–145)
Total Bilirubin: 0.7 mg/dL (ref 0.2–1.2)
Total Protein: 8.9 g/dL — ABNORMAL HIGH (ref 6.0–8.3)

## 2014-11-04 LAB — CBC
HCT: 34.3 % — ABNORMAL LOW (ref 36.0–46.0)
HEMOGLOBIN: 10.8 g/dL — AB (ref 12.0–15.0)
MCH: 25.9 pg — ABNORMAL LOW (ref 26.0–34.0)
MCHC: 31.5 g/dL (ref 30.0–36.0)
MCV: 82.3 fL (ref 78.0–100.0)
MPV: 11.4 fL (ref 8.6–12.4)
PLATELETS: 391 10*3/uL (ref 150–400)
RBC: 4.17 MIL/uL (ref 3.87–5.11)
RDW: 18.4 % — ABNORMAL HIGH (ref 11.5–15.5)
WBC: 9.2 10*3/uL (ref 4.0–10.5)

## 2014-11-04 MED ORDER — LISINOPRIL-HYDROCHLOROTHIAZIDE 20-12.5 MG PO TABS: 2.0000 | ORAL_TABLET | Freq: Every day | ORAL | Status: DC

## 2014-11-04 MED ORDER — OLOPATADINE HCL 0.2 % OP SOLN: 1.0000 [drp] | Freq: Every day | OPHTHALMIC | Status: DC

## 2014-11-04 MED ORDER — TRAMADOL HCL 50 MG PO TABS: 50.0000 mg | ORAL_TABLET | Freq: Three times a day (TID) | ORAL | Status: DC | PRN

## 2014-11-04 MED ORDER — CETIRIZINE HCL 10 MG PO TABS: 10.0000 mg | ORAL_TABLET | Freq: Every day | ORAL | Status: DC

## 2014-11-04 MED ORDER — HYDROCORTISONE VALERATE 0.2 % EX OINT: 1.0000 "application " | TOPICAL_OINTMENT | Freq: Two times a day (BID) | CUTANEOUS | Status: DC

## 2014-11-04 MED ORDER — AMLODIPINE BESYLATE 10 MG PO TABS: 5.0000 mg | ORAL_TABLET | Freq: Every day | ORAL | Status: DC

## 2014-11-04 NOTE — Progress Notes (Signed)
A bus pass was given to pt after today's visit.  Hunt Oris, MSW, [place]

## 2014-11-04 NOTE — Patient Instructions (Addendum)
Nice to see you. I am increasing your amlodipine for your blood pressure. You need to take this and the lisinopril-HCTZ. Please start using the eye drops and allergy pill for your eyes.  I have prescribed tramadol for your pain.  We will refer you to dermatology. This may take some time to get this done.  You need to see the cardiologist for your intermittent chest pain.  If you develop chest pain, shortness of breath, sweating, or blood pressures above 170 consistently please seek medical attention.   Chest Pain (Nonspecific) It is often hard to give a specific diagnosis for the cause of chest pain. There is always a chance that your pain could be related to something serious, such as a heart attack or a blood clot in the lungs. You need to follow up with your health care provider for further evaluation. CAUSES   Heartburn.  Pneumonia or bronchitis.  Anxiety or stress.  Inflammation around your heart (pericarditis) or lung (pleuritis or pleurisy).  A blood clot in the lung.  A collapsed lung (pneumothorax). It can develop suddenly on its own (spontaneous pneumothorax) or from trauma to the chest.  Shingles infection (herpes zoster virus). The chest wall is composed of bones, muscles, and cartilage. Any of these can be the source of the pain.  The bones can be bruised by injury.  The muscles or cartilage can be strained by coughing or overwork.  The cartilage can be affected by inflammation and become sore (costochondritis). DIAGNOSIS  Lab tests or other studies may be needed to find the cause of your pain. Your health care provider may have you take a test called an ambulatory electrocardiogram (ECG). An ECG records your heartbeat patterns over a 24-hour period. You may also have other tests, such as:  Transthoracic echocardiogram (TTE). During echocardiography, sound waves are used to evaluate how blood flows through your heart.  Transesophageal echocardiogram (TEE).  Cardiac  monitoring. This allows your health care provider to monitor your heart rate and rhythm in real time.  Holter monitor. This is a portable device that records your heartbeat and can help diagnose heart arrhythmias. It allows your health care provider to track your heart activity for several days, if needed.  Stress tests by exercise or by giving medicine that makes the heart beat faster. TREATMENT   Treatment depends on what may be causing your chest pain. Treatment may include:  Acid blockers for heartburn.  Anti-inflammatory medicine.  Pain medicine for inflammatory conditions.  Antibiotics if an infection is present.  You may be advised to change lifestyle habits. This includes stopping smoking and avoiding alcohol, caffeine, and chocolate.  You may be advised to keep your head raised (elevated) when sleeping. This reduces the chance of acid going backward from your stomach into your esophagus. Most of the time, nonspecific chest pain will improve within 2-3 days with rest and mild pain medicine.  HOME CARE INSTRUCTIONS   If antibiotics were prescribed, take them as directed. Finish them even if you start to feel better.  For the next few days, avoid physical activities that bring on chest pain. Continue physical activities as directed.  Do not use any tobacco products, including cigarettes, chewing tobacco, or electronic cigarettes.  Avoid drinking alcohol.  Only take medicine as directed by your health care provider.  Follow your health care provider's suggestions for further testing if your chest pain does not go away.  Keep any follow-up appointments you made. If you do not go to  an appointment, you could develop lasting (chronic) problems with pain. If there is any problem keeping an appointment, call to reschedule. SEEK MEDICAL CARE IF:   Your chest pain does not go away, even after treatment.  You have a rash with blisters on your chest.  You have a fever. SEEK  IMMEDIATE MEDICAL CARE IF:   You have increased chest pain or pain that spreads to your arm, neck, jaw, back, or abdomen.  You have shortness of breath.  You have an increasing cough, or you cough up blood.  You have severe back or abdominal pain.  You feel nauseous or vomit.  You have severe weakness.  You faint.  You have chills. This is an emergency. Do not wait to see if the pain will go away. Get medical help at once. Call your local emergency services (911 in [place] Do not drive yourself to the hospital. MAKE SURE YOU:   Understand these instructions.  Will watch your condition.  Will get help right away if you are not doing well or get worse. Document Released: 06/22/2005 Document Revised: 09/17/2013 Document Reviewed: 04/17/2008 [facility] Patient Information 2015 [place], [place]. This information is not intended to replace advice given to you by your health care provider. Make sure you discuss any questions you have with your health care provider.

## 2014-11-07 ENCOUNTER — Encounter: Payer: Self-pay | Admitting: Family Medicine

## 2014-11-07 ENCOUNTER — Other Ambulatory Visit: Payer: Self-pay | Admitting: Family Medicine

## 2014-11-07 DIAGNOSIS — J309 Allergic rhinitis, unspecified: Secondary | ICD-10-CM | POA: Insufficient documentation

## 2014-11-07 MED ORDER — AMLODIPINE BESYLATE 10 MG PO TABS: 10.0000 mg | ORAL_TABLET | Freq: Every day | ORAL | Status: DC

## 2014-11-07 NOTE — Assessment & Plan Note (Signed)
Continues to have chest pain intermittently. No active pain or dyspnea. Feel that she would benefit from seeing cardiology again to consider stress testing. Given return precautions. Cariology appointment scheduled for the patient while she was in the office.

## 2014-11-07 NOTE — Assessment & Plan Note (Addendum)
Patient with symptoms of allergic rhinitis. Will treat with zyrtec and trial pataday for what sounds like allergic conjunctivitis. F/u if not improving.

## 2014-11-07 NOTE — Assessment & Plan Note (Signed)
Not at goal. Will continue lisinopril and HCTZ. Increase amlodipine to 10 mg. If not controlled at f/u consider adding beta blocker. Check CMET. Given return precautions.

## 2014-11-07 NOTE — Progress Notes (Signed)
Patient ID: Gaylia Kassel, female   DOB: 1955/10/18, 59 y.o.   MRN: [id number]  Tommi Rumps, MD Phone: [phone]  Najee Cowens is a 59 y.o. female who presents today for f/u.  Scalp itching: notes this has been present for years. No flaking or rash. Better with dandruff shampoo. Has been getting worse. Notes rash intermittently just inferior to her areolas that itches. Nothing makes the pruritis worse. No fever.   Watery eyes: notes for several years with get watery eyes with mild milky stringy discharge. Associated with rhinorrhea. Also itchy eyes. Has gotten worse. Has not tried any medication for this. No red eyes. No eye pain.   Lump in RUQ: notes this comes up only with she is in the process of standing up or bending over. It intermittent. There is a lump that then goes away. There is pain for several seconds that resolves on its own with this. Has been happening once a day for the past several months. Started a year ago. Had nothing there previously. No pain at other times. Is not worse with eating.  Neuropathy: notes her feet ache. Is worse when she gets out of bed. Is in the bottom of her feet. Toes are sore. No erythema. Feels like pins and needles. Was supposed to start on elavil previously though did not fill this medication. Has good sensation in feet.  HYPERTENSION Disease Monitoring Home BP Monitoring notes is typically 160 systolic Chest pain- yes, see below    Dyspnea- yes, see below Medications Compliance-  Taking HCTZ, amlodipine, and lisinopril.  Edema- no  Chest pain: notes this is unchanged from previously. It is in the left side of her chest and is an ache. States the whole left side will ache. This happens 2x/wk. Comes on when walking around. She notes dyspnea with it. No diaphoresis. No CP or SOB at this time. She has been evaluated by cardiology for this with an echo. Per review of their notes they were going to consider stress testing for her chest pain if it  persisted. They felt CP was atypical.    Patient is a smoker.   ROS: Per HPI   Physical Exam Filed Vitals:   11/04/14 1453  BP: 178/78  Pulse: 86  Temp: 98.5 F (36.9 C)    Gen: Well NAD HEENT: PERRL,  MMM, scalp with no flaking or rash, no scalp abnormalities appreciated, no conjunctival erythema, normal TMs bilaterally, normal OP Lungs: CTABL Nl WOB Heart: RRR  Abd: soft, NT, ND, no lump appreciated on exam with patient relaxing abdominal muscles and with patient performing a crunch MSK: bilateral feet with no tenderness to palpation, no swelling, no erythema, sensation intact, though patient note pins and needles with palpation of feet Exts: Non edematous BL  LE, warm and well perfused.    Assessment/Plan: Please see individual problem list.  Tommi Rumps, MD [place] PGY-3

## 2014-11-07 NOTE — Assessment & Plan Note (Signed)
Unsure of cause at this time with no obvious abnormality on exam. Will check CMET and CBC to see these given indication of cause. Patient requesting referral to dermatology. This was placed for further evaluation. Will start on zyrtec and given westcort cream for area of rash on breasts.

## 2014-11-07 NOTE — Assessment & Plan Note (Signed)
Bilateral foot pins and needles pain would seem to indicate neuropathy as cause. Will see if can tolerate gabapentin for this issue. Tramadol for discomfort as this has worked in the past. Consider checking B12 at next visit.

## 2014-11-12 ENCOUNTER — Ambulatory Visit: Payer: Self-pay

## 2014-11-19 ENCOUNTER — Encounter: Payer: Self-pay | Admitting: Physician Assistant

## 2014-11-19 ENCOUNTER — Ambulatory Visit (INDEPENDENT_AMBULATORY_CARE_PROVIDER_SITE_OTHER): Payer: No Typology Code available for payment source | Admitting: Physician Assistant

## 2014-11-19 VITALS — BP 130/60 | HR 76 | Ht 63.0 in | Wt 125.0 lb

## 2014-11-19 DIAGNOSIS — I10 Essential (primary) hypertension: Secondary | ICD-10-CM

## 2014-11-19 DIAGNOSIS — M79651 Pain in right thigh: Secondary | ICD-10-CM

## 2014-11-19 DIAGNOSIS — M79652 Pain in left thigh: Secondary | ICD-10-CM

## 2014-11-19 DIAGNOSIS — Z720 Tobacco use: Secondary | ICD-10-CM

## 2014-11-19 DIAGNOSIS — I340 Nonrheumatic mitral (valve) insufficiency: Secondary | ICD-10-CM

## 2014-11-19 DIAGNOSIS — R079 Chest pain, unspecified: Secondary | ICD-10-CM

## 2014-11-19 NOTE — Patient Instructions (Addendum)
Your physician recommends that you continue on your current medications as directed. Please refer to the Current Medication list given to you today.  Your physician recommends that you schedule a follow-up appointment in: 3 months with Dr. Irish Lack.   Your physician has requested that you have en exercise stress myoview. For further information please visit [email or web address]. Please follow instruction sheet, as given.   Your physician has requested that you have a RIGHT and LEFT ankle brachial index (ABI) and lower extremity ultra sound. During this test an ultrasound and blood pressure cuff are used to evaluate the arteries that supply the arms and legs with blood. Allow thirty minutes for this exam. There are no restrictions or special instructions.

## 2014-11-19 NOTE — Progress Notes (Signed)
Cardiology Office Note   Date:  11/19/2014   ID:  Lisa [name], DOB May 11, 1956, MRN [id number]  PCP:  Tommi Rumps, MD  Cardiologist:  Dr. Casandra Doffing    Chief Complaint  Patient presents with  . Chest Pain     History of Present Illness: Lisa [name] is a 59 y.o. female with a hx of HTN, mitral valve prolapse, HCV, tobacco abuse.  Seen by Dr. Casandra Doffing 06/2014.  Amlodipine was added for BP.  Echo demonstrated normal LV function, moderate LVH, mild MR. Blood pressure control is recommended with a follow-up echocardiogram in 2 years.  She returns for the evaluation of chest pain.  This has been occuring off and on for years.  It has gotten worse over the last several mos.  She notes substernal discomfort that radiates to her L chest and L Arm.  She notes symptoms with exertion as well as at rest.  She can exert herself without symptoms sometimes.  Symptoms may last 2 mins.  She has assoc dyspnea.  She denies assoc nausea, diaphoresis.  She denies syncope.  She denies assoc with meals.  She denies dysphagia, odynophagia.  She denies pleuritic CP or CP with positional changes.  She sleeps on 2 pillows chronically.  No PND, edema.    Studies/Reports Reviewed Today:  Echocardiogram 07/2014 - mod LVH, vigorous LVF, EF 65-70%.  Wall motion normal.  Grade 2 diastolic dysfunction - Mitral valve:  mild regurgitation directed posteriorly. Valve area by pressure half-time: 2.32 cm^2. Valve area by continuity equation (using LVOT flow): 1.33 cm^2. - Left atrium: The atrium was mildly to moderately dilated. - Tricuspid valve: There was mild regurgitation.  Myoview 04/2010 Normal, EF 74%    Past Medical History  Diagnosis Date  . Osteopetrosis   . Hepatitis C   . Tobacco use   . Alcohol abuse, daily use   . HTN (hypertension)   . Poor dental hygiene   . Mitral valve prolapse     Past Surgical History  Procedure Laterality Date  . Abdominal hysterectomy    . Laparoscopy      . Rod in right arm    . Carpal tunnel release       Current Outpatient Prescriptions  Medication Sig Dispense Refill  . amLODipine (NORVASC) 10 MG tablet Take 1 tablet (10 mg total) by mouth daily. 90 tablet 2  . cetirizine (ZYRTEC) 10 MG tablet Take 1 tablet (10 mg total) by mouth daily. 30 tablet 11  . cyclobenzaprine (FLEXERIL) 5 MG tablet Take 1 tablet (5 mg total) by mouth 3 (three) times daily as needed for muscle spasms. 30 tablet 0  . hydrocortisone valerate ointment (WESTCORT) 0.2 % Apply 1 application topically 2 (two) times daily. To rash for 5 days. 45 g 0  . lisinopril-hydrochlorothiazide (PRINZIDE,ZESTORETIC) 20-12.5 MG per tablet Take 2 tablets by mouth daily. 180 tablet 1  . naproxen (NAPROSYN) 500 MG tablet Take 1 tablet (500 mg total) by mouth 2 (two) times daily with a meal. 60 tablet 0  . Olopatadine HCl 0.2 % SOLN Apply 1 drop to eye daily. 2.5 mL 2  . traMADol (ULTRAM) 50 MG tablet Take 1 tablet (50 mg total) by mouth every 8 (eight) hours as needed. 30 tablet 0   No current facility-administered medications for this visit.    Allergies:   Celecoxib and Sulfonamide derivatives    Social History:  The patient  reports that she has been smoking Cigarettes.  She has been  smoking about 0.50 packs per day. She does not have any smokeless tobacco history on file. She reports that she drinks alcohol. She reports that she uses illicit drugs (Marijuana and Cocaine).   Family History:  The patient's family history includes Cancer in her father; Stroke in her sister. There is no history of Heart attack.    ROS:   Please see the history of present illness.   Review of Systems  Constitution: Positive for diaphoresis and malaise/fatigue.       Night sweats  HENT: Positive for nosebleeds.   Eyes: Positive for visual disturbance.  Respiratory: Positive for cough.        Clear sputum  Skin: Positive for rash.       Under breasts - Rx by PCP  Musculoskeletal: Positive for  back pain and myalgias.       + bilat thigh pain with exertion  Gastrointestinal: Positive for abdominal pain.       With position changes  Psychiatric/Behavioral: Positive for depression.  All other systems reviewed and are negative.     PHYSICAL EXAM: VS:  BP 130/60 mmHg  Pulse 76  Ht 5\' 3"  (1.6 m)  Wt 125 lb (56.7 kg)  BMI 22.15 kg/m2    Wt Readings from Last 3 Encounters:  11/19/14 125 lb (56.7 kg)  11/04/14 125 lb (56.7 kg)  07/24/14 126 lb (57.153 kg)     GEN: Well nourished, well developed, in no acute distress HEENT: normal Neck: no JVD, no carotid bruits, no masses Cardiac:  Normal S1/S2, RRR; no murmur, no rubs or gallops, no edema ; DP/PT 1+ bilat Respiratory:  Decreased breath sounds bilaterally, no wheezing, rhonchi or rales. GI: soft, nontender, nondistended, + BS MS: no deformity or atrophy Skin: warm and dry  Neuro:  CNs II-XII intact, Strength and sensation are intact Psych: Normal affect   EKG:  EKG is ordered today.  It demonstrates:   NSR, HR 76, normal axis, no ST changes.   Recent Labs: 03/31/2014: TSH 0.605 11/04/2014: ALT 34; BUN 7; Creatinine 0.58; Hemoglobin 10.8*; Platelets 391; Potassium 4.1; Sodium 134*    Lipid Panel    Component Value Date/Time   CHOL 152 04/24/2014 0953   TRIG 88 04/24/2014 0953   HDL 46 04/24/2014 0953   CHOLHDL 3.3 04/24/2014 0953   VLDL 18 04/24/2014 0953   LDLCALC 88 04/24/2014 0953   LDLDIRECT 94 01/24/2013 1501      ASSESSMENT AND PLAN:  1.  Chest Pain:  She has typical and atypical features.  However, she has significant risk factors (HTN, smoking).  It has been several years since her last stress test.      -  Arrange ETT-Myoview. 2.  Leg Pain:  She has symptoms that sounds like claudication.      -  Arrange ABIs/Arterial duplex 3.  HTN:  Controlled. 4.  MVP:  Recent echo with mild MR.  FU echo planned in 2017. 5.  Tobacco Abuse:  I have recommended cessation.  Current medicines are reviewed at  length with the patient today.  The patient does not have concerns regarding medicines.  The following changes have been made:  no change  Labs/ tests ordered today include:   Orders Placed This Encounter  Procedures  . Myocardial Perfusion Imaging  . EKG 12-Lead  . Lower Extremity Arterial Duplex Bilateral     Disposition:   FU with Dr. Casandra Doffing  in 3 months.   Signed, Richardson Dopp, PA-C, M[facility] 11/19/2014  11:29 AM    [facility] [address], [place], [place]  [address] Phone: [phone]; Fax: [phone]

## 2014-11-20 ENCOUNTER — Ambulatory Visit: Payer: Self-pay

## 2014-11-21 ENCOUNTER — Other Ambulatory Visit: Payer: Self-pay | Admitting: Family Medicine

## 2014-11-21 ENCOUNTER — Telehealth: Payer: Self-pay | Admitting: *Deleted

## 2014-11-21 DIAGNOSIS — Z1211 Encounter for screening for malignant neoplasm of colon: Secondary | ICD-10-CM

## 2014-11-21 NOTE — Telephone Encounter (Signed)
-----   Message from Leone Haven, MD sent at 11/21/2014  9:52 AM EST ----- Please let the patient know that her hemoglobin was mildly decreased from previously. We will need to recheck this at her next office visit. She needs to follow-up with me in 2 weeks. She also needs to be set up for a colonoscopy given that she has not had one previously. Order placed for GI referral for screening colonoscopy.

## 2014-11-21 NOTE — Telephone Encounter (Signed)
Patient will need stool cards then. If you are able to get in touch with her please let her know that. Thanks.

## 2014-11-21 NOTE — Telephone Encounter (Signed)
Called pt but could not leave VM as phone is not receiving VM. Will try again later. Pt does not have financial assistance and cannot be scheduled for colonoscopy at this time (made note of this in workqueue referral about this as well). Esten Dollar, CMA.

## 2014-11-21 NOTE — Telephone Encounter (Signed)
Tried to call patient again but no way to leave VM.  Atiana Levier,CMA

## 2014-11-25 NOTE — Telephone Encounter (Signed)
Pt is returning our phone call jw

## 2014-11-25 NOTE — Telephone Encounter (Signed)
Spoke with patient and she voiced understanding on needing to have this done before her appt.  She will come by and pick up the 3-card take home packet.  Also she wanted to make [place] aware that she is having foot aching and they are just tired.  It doesn't matter if she takes her socks and shoes off and rests.  Also she has a "numbness and discomfort", but can "still feel them".  Advised to mention at her follow up appt. Neftaly Swiss,CMA

## 2014-11-25 NOTE — Telephone Encounter (Signed)
Tried calling pt and unable to leave VM will try again later. Daylyn Azbill, CMA.

## 2014-11-25 NOTE — Telephone Encounter (Signed)
Advised pt as directed below and verbalized understanding. She stated that she had colonoscopy done about 42yrs ago at [facility] and is suppose to have it repeated in 66yrs. Did you still want pt to do stool cards? She stated that she saw Dr. Kathlen Mody last week and ordered a stress test on 12/10/13. Scheduled FU appt with you on 12/09/14 @2 :30pm.

## 2014-11-25 NOTE — Telephone Encounter (Signed)
I would like for her to have the stool cards done. This is due to her being anemic and the stool cards will help [place] make sure that the blood is not coming from her GI tract.Thanks.

## 2014-11-26 ENCOUNTER — Telehealth: Payer: Self-pay | Admitting: Family Medicine

## 2014-11-26 NOTE — Telephone Encounter (Signed)
Will mail stool cards to pt at address in chart. Mihir Flanigan, CMA.

## 2014-11-26 NOTE — Telephone Encounter (Signed)
Pt called and would like [place] to mail her the stool cards. Her address in Epic is correct. jw

## 2014-11-28 ENCOUNTER — Encounter: Payer: Self-pay | Admitting: Cardiology

## 2014-12-03 ENCOUNTER — Other Ambulatory Visit (HOSPITAL_COMMUNITY): Payer: Self-pay | Admitting: Cardiology

## 2014-12-03 DIAGNOSIS — I739 Peripheral vascular disease, unspecified: Secondary | ICD-10-CM

## 2014-12-09 ENCOUNTER — Ambulatory Visit (INDEPENDENT_AMBULATORY_CARE_PROVIDER_SITE_OTHER): Payer: No Typology Code available for payment source | Admitting: Family Medicine

## 2014-12-09 VITALS — BP 146/78 | HR 84 | Temp 98.5°F | Ht 63.0 in | Wt 126.4 lb

## 2014-12-09 DIAGNOSIS — D649 Anemia, unspecified: Secondary | ICD-10-CM

## 2014-12-09 DIAGNOSIS — Z8739 Personal history of other diseases of the musculoskeletal system and connective tissue: Secondary | ICD-10-CM

## 2014-12-09 DIAGNOSIS — Q782 Osteopetrosis: Secondary | ICD-10-CM

## 2014-12-09 DIAGNOSIS — R059 Cough, unspecified: Secondary | ICD-10-CM

## 2014-12-09 DIAGNOSIS — R05 Cough: Secondary | ICD-10-CM

## 2014-12-09 DIAGNOSIS — I10 Essential (primary) hypertension: Secondary | ICD-10-CM

## 2014-12-09 DIAGNOSIS — M79659 Pain in unspecified thigh: Secondary | ICD-10-CM

## 2014-12-09 DIAGNOSIS — R058 Other specified cough: Secondary | ICD-10-CM

## 2014-12-09 DIAGNOSIS — R040 Epistaxis: Secondary | ICD-10-CM

## 2014-12-09 DIAGNOSIS — M792 Neuralgia and neuritis, unspecified: Secondary | ICD-10-CM

## 2014-12-09 MED ORDER — TRAMADOL HCL 50 MG PO TABS: 50.0000 mg | ORAL_TABLET | Freq: Three times a day (TID) | ORAL | Status: DC | PRN

## 2014-12-09 NOTE — Progress Notes (Signed)
Patient ID: Lisa [name], female   DOB: Sep 24, 1956, 59 y.o.   MRN: [id number]  Tommi Rumps, MD Phone: [phone]  Lisa [name] is a 59 y.o. female who presents today for f/u.  Thigh aching: patient notes long history of thigh pain. Notes this is worse with walking. Is an aching pain and can become excrutiating with walking, though can also have this pain at rest. She just saw cardiology who ordered ABIs to evaluate this. She also wonders about her bones as she has previously been told she has osteopenia.   Patient also endorses intermittent cough with phlegm that she gets "choked" on. Has had this for many years. Gags on the phelgm. No issues swallowing foods or liquids. Happens every day. She smokes daily. No reflux symptoms. No dyspnea at this time. Does note some dyspnea with exertion and intermittent chest pain that is being worked up by cardiology.  Anemia: patient with history of anemia in the past. Most recently with Hgb 10.8. Prior colonoscopy normal in 2013 and this was evidently performed for anemia per the note from [facility]. She denies blood in stool, hematuria, or other bleeding. She does note a past history of nose bleeds lasting 10-15 minutes resolving with pressure. Only one nose bleed in the past year.   Patient is a smoker.   ROS: Per HPI   Physical Exam Filed Vitals:   12/09/14 1614  BP: 146/78  Pulse:   Temp:     Gen: Well NAD HEENT: PERRL,  MMM, normal OP, no swollen tonsils, no masses palpated in neck Lungs: CTABL Nl WOB Heart: RRR  MSK: no tenderness of muscles bilaterally in thighs or calves, no LE swelling Neuro: 5/5 strength in bilateral quads, hamstrings, plantar and dorsiflexion, sensation to light touch intact in bilateral LE, normal gait Exts: Non edematous BL  LE, warm and well perfused.    Assessment/Plan: Please see individual problem list.  # Healthcare maintenance: DEXA scan ordered  Tommi Rumps, MD [place] PGY-3

## 2014-12-09 NOTE — Patient Instructions (Signed)
Nice to see you. Please get the study done by the cardiologist. We will get some lab work and will send a letter with the results. Please go get the chest x-ray.  If you develop chest pain, trouble breathing, light headedness, worsening leg pain please seek medical attention.

## 2014-12-10 ENCOUNTER — Encounter: Payer: Self-pay | Admitting: Family Medicine

## 2014-12-10 DIAGNOSIS — M79659 Pain in unspecified thigh: Secondary | ICD-10-CM | POA: Insufficient documentation

## 2014-12-10 DIAGNOSIS — R05 Cough: Secondary | ICD-10-CM | POA: Insufficient documentation

## 2014-12-10 DIAGNOSIS — R058 Other specified cough: Secondary | ICD-10-CM | POA: Insufficient documentation

## 2014-12-10 LAB — CBC
HEMATOCRIT: 35.1 % — AB (ref 36.0–46.0)
HEMOGLOBIN: 10.8 g/dL — AB (ref 12.0–15.0)
MCH: 25.6 pg — ABNORMAL LOW (ref 26.0–34.0)
MCHC: 30.8 g/dL (ref 30.0–36.0)
MCV: 83.2 fL (ref 78.0–100.0)
MPV: 11.5 fL (ref 8.6–12.4)
Platelets: 329 10*3/uL (ref 150–400)
RBC: 4.22 MIL/uL (ref 3.87–5.11)
RDW: 18.8 % — ABNORMAL HIGH (ref 11.5–15.5)
WBC: 6.6 10*3/uL (ref 4.0–10.5)

## 2014-12-10 LAB — BASIC METABOLIC PANEL
BUN: 7 mg/dL (ref 6–23)
CO2: 29 mEq/L (ref 19–32)
Calcium: 9 mg/dL (ref 8.4–10.5)
Chloride: 105 mEq/L (ref 96–112)
Creat: 0.73 mg/dL (ref 0.50–1.10)
Glucose, Bld: 93 mg/dL (ref 70–99)
POTASSIUM: 3.9 meq/L (ref 3.5–5.3)
Sodium: 139 mEq/L (ref 135–145)

## 2014-12-10 LAB — IRON AND TIBC
%SAT: 16 % — ABNORMAL LOW (ref 20–55)
Iron: 57 ug/dL (ref 42–145)
TIBC: 366 ug/dL (ref 250–470)
UIBC: 309 ug/dL (ref 125–400)

## 2014-12-10 LAB — PROTIME-INR
INR: 1.24 (ref <1.50)
Prothrombin Time: 15.6 seconds — ABNORMAL HIGH (ref 11.6–15.2)

## 2014-12-10 LAB — FERRITIN: Ferritin: 27 ng/mL (ref 10–291)

## 2014-12-10 NOTE — Assessment & Plan Note (Signed)
Sounds like claudication pain. ABIs ordered by cards. Will await the results. Regardless of results patient was counseled on smoking cessation and will need this to continue.

## 2014-12-10 NOTE — Assessment & Plan Note (Signed)
Infrequent episodes of this in the past. No recent episodes. Has liver dysfunction and anemia, so will recheck INR. No signs of volume depletion on vitals. Continue to monitor.

## 2014-12-10 NOTE — Assessment & Plan Note (Signed)
DEXA ordered as it has been at least 2 years since last scan and not on therapy for this issue. Doubt this is the cause of her claudication like symptoms.

## 2014-12-10 NOTE — Assessment & Plan Note (Signed)
Patient with continued pins and needles sensation in feet. Suspect this aspect is related to a neuropathic issue, though she does have symptoms of claudication. Will await results of ABIs this week prior to initiating different treatment for neuropathic pain. Continue tramadol at this time. Advised patient that we would not prescribe narcotics for this type of pain after she stated she would like oxycodone for this.

## 2014-12-10 NOTE — Assessment & Plan Note (Addendum)
Patient with productive cough likely related to tobacco abuse. She has been afebrile and pulm exam normal making PNA unlikely. Potentially could have developed COPD or chronic bronchitis relating to smoking. Could be related to allergic rhinitis as she has been recently diagnosed with this. No signs of reflux on history. Will obtain CXR to evaluate for lung disease. Consider PFTs in the future given dyspnea (once stress test completed) and consider flonase.

## 2014-12-11 ENCOUNTER — Ambulatory Visit (HOSPITAL_COMMUNITY): Payer: No Typology Code available for payment source | Attending: Internal Medicine | Admitting: Cardiology

## 2014-12-11 ENCOUNTER — Ambulatory Visit (HOSPITAL_BASED_OUTPATIENT_CLINIC_OR_DEPARTMENT_OTHER): Payer: No Typology Code available for payment source | Admitting: Radiology

## 2014-12-11 ENCOUNTER — Encounter (HOSPITAL_COMMUNITY): Payer: No Typology Code available for payment source

## 2014-12-11 DIAGNOSIS — I739 Peripheral vascular disease, unspecified: Secondary | ICD-10-CM | POA: Insufficient documentation

## 2014-12-11 DIAGNOSIS — M79652 Pain in left thigh: Secondary | ICD-10-CM | POA: Insufficient documentation

## 2014-12-11 DIAGNOSIS — R079 Chest pain, unspecified: Secondary | ICD-10-CM | POA: Insufficient documentation

## 2014-12-11 DIAGNOSIS — M79651 Pain in right thigh: Secondary | ICD-10-CM | POA: Insufficient documentation

## 2014-12-11 MED ORDER — TECHNETIUM TC 99M SESTAMIBI GENERIC - CARDIOLITE
33.0000 | Freq: Once | INTRAVENOUS | Status: AC | PRN
  Administered 2014-12-11: 33 via INTRAVENOUS

## 2014-12-11 MED ORDER — REGADENOSON 0.4 MG/5ML IV SOLN
0.4000 mg | Freq: Once | INTRAVENOUS | Status: AC
  Administered 2014-12-11: 0.4 mg via INTRAVENOUS

## 2014-12-11 MED ORDER — TECHNETIUM TC 99M SESTAMIBI GENERIC - CARDIOLITE
11.0000 | Freq: Once | INTRAVENOUS | Status: AC | PRN
  Administered 2014-12-11: 11 via INTRAVENOUS

## 2014-12-11 NOTE — Progress Notes (Signed)
Lower arterial Doppler performed  

## 2014-12-11 NOTE — Progress Notes (Signed)
Lisa [name] [address] [place], [place] [address] [phone]    Cardiology Nuclear Med Study  [name] is a 59 y.o. female     MRN : [id number]     DOB: 08/30/1956  Procedure Date: 12/11/2014  Nuclear Med Background Indication for Stress Test:  Evaluation for Ischemia History:  MPI 2011 (normal) EF 74% Cardiac Risk Factors: Hypertension  Symptoms:  Chest Pain, Chest Pain with Exertion (last date of chest discomfort was earlier today) and DOE   Nuclear Pre-Procedure Caffeine/Decaff Intake:  None NPO After: 7:00pm   Lungs:  clear O2 Sat: 98% on room air. IV 0.9% NS with Angio Cath:  22g  IV Site: L Antecubital  IV Started by:  Crissie Figures, RN  Chest Size (in):  36 Cup Size: C  Height: 5\' 3"  (1.6 m)  Weight:  124 lb (56.246 kg)  BMI:  Body mass index is 21.97 kg/(m^2). Tech Comments:  N/A    Nuclear Med Study 1 or 2 day study: 1 day  Stress Test Type:  Stress  Reading MD: N/A  Order Authorizing Provider:  Larae Grooms, MD  Resting Radionuclide: Technetium 65m Sestamibi  Resting Radionuclide Dose: 11.0 mCi   Stress Radionuclide:  Technetium 58m Sestamibi  Stress Radionuclide Dose: 33.0 mCi           Stress Protocol Rest HR: 74 Stress HR: 104  Rest BP: 166/82 Stress BP: 141/66  Exercise Time (min): n/a METS: n/a   Predicted Max HR: 161 bpm % Max HR: 64.6 bpm Rate Pressure Product: 17056   Dose of Adenosine (mg):  n/a Dose of Lexiscan: 0.4 mg  Dose of Atropine (mg): n/a Dose of Dobutamine: n/a mcg/kg/min (at max HR)  Stress Test Technologist: Glade Lloyd, BS-ES  Nuclear Technologist:  Earl Many, CNMT     Rest Procedure:  Myocardial perfusion imaging was performed at rest 45 minutes following the intravenous administration of Technetium 69m Sestamibi. Rest ECG: NSR with non-specific ST-T wave changes  Stress Procedure:  The patient received IV Lexiscan 0.4 mg over 15-seconds.  Technetium 67m Sestamibi injected at  30-seconds.  Quantitative spect images were obtained after a 45 minute delay.  During the infusion of Lexiscan the patient complained of cough, SOB, feeling funny, queasy and a headache.  These symptoms began to resolve in recovery.  Stress ECG: No significant ST segment change suggestive of ischemia.  QPS Raw Data Images:  Acquisition technically good; normal left ventricular size. Stress Images:  Normal homogeneous uptake in all areas of the myocardium. Rest Images:  Normal homogeneous uptake in all areas of the myocardium. Subtraction (SDS):  Normal Transient Ischemic Dilatation (Normal <1.22):  0.90 Lung/Heart Ratio (Normal <0.45):  0.28  Quantitative Gated Spect Images QGS EDV:  94 ml QGS ESV:  31 ml  Impression Exercise Capacity:  Lexiscan with no exercise. BP Response:  Normal blood pressure response. Clinical Symptoms:  There is dyspnea. ECG Impression:  No significant ST segment change suggestive of ischemia. Comparison with Prior Nuclear Study: No significant change from previous study  Overall Impression:  Normal stress nuclear study.  LV Ejection Fraction: 66%.  LV Wall Motion:  NL LV Function; NL Wall Motion  Kirk Ruths

## 2014-12-12 ENCOUNTER — Telehealth: Payer: Self-pay | Admitting: *Deleted

## 2014-12-12 ENCOUNTER — Encounter: Payer: Self-pay | Admitting: Physician Assistant

## 2014-12-12 NOTE — Telephone Encounter (Signed)
pt notified about LEA/ABI and myoview results normal with verbal understanding. Pt asked if her bulging disc could be causing her back/leg pain, I advised yes and to f/u w/PCP, pt said ok

## 2014-12-15 LAB — POC HEMOCCULT BLD/STL (HOME/3-CARD/SCREEN): FECAL OCCULT BLD: NEGATIVE

## 2014-12-15 NOTE — Addendum Note (Signed)
Addended by: Martin[place], Devun Anna on: 12/15/2014 10:14 AM   Modules accepted: Orders

## 2014-12-23 ENCOUNTER — Encounter: Payer: Self-pay | Admitting: Family Medicine

## 2014-12-23 ENCOUNTER — Ambulatory Visit (INDEPENDENT_AMBULATORY_CARE_PROVIDER_SITE_OTHER): Payer: No Typology Code available for payment source | Admitting: Family Medicine

## 2014-12-23 VITALS — BP 178/92 | HR 97 | Temp 98.0°F | Ht 63.0 in | Wt 125.0 lb

## 2014-12-23 DIAGNOSIS — I10 Essential (primary) hypertension: Secondary | ICD-10-CM

## 2014-12-23 DIAGNOSIS — R1011 Right upper quadrant pain: Secondary | ICD-10-CM

## 2014-12-23 DIAGNOSIS — F1010 Alcohol abuse, uncomplicated: Secondary | ICD-10-CM

## 2014-12-23 DIAGNOSIS — R058 Other specified cough: Secondary | ICD-10-CM

## 2014-12-23 DIAGNOSIS — R109 Unspecified abdominal pain: Secondary | ICD-10-CM | POA: Insufficient documentation

## 2014-12-23 DIAGNOSIS — B1920 Unspecified viral hepatitis C without hepatic coma: Secondary | ICD-10-CM

## 2014-12-23 DIAGNOSIS — R05 Cough: Secondary | ICD-10-CM

## 2014-12-23 LAB — CBC
HEMATOCRIT: 36 % (ref 36.0–46.0)
Hemoglobin: 11.2 g/dL — ABNORMAL LOW (ref 12.0–15.0)
MCH: 25.7 pg — ABNORMAL LOW (ref 26.0–34.0)
MCHC: 31.1 g/dL (ref 30.0–36.0)
MCV: 82.6 fL (ref 78.0–100.0)
MPV: 11.2 fL (ref 8.6–12.4)
Platelets: 479 10*3/uL — ABNORMAL HIGH (ref 150–400)
RBC: 4.36 MIL/uL (ref 3.87–5.11)
RDW: 18.3 % — AB (ref 11.5–15.5)
WBC: 8.3 10*3/uL (ref 4.0–10.5)

## 2014-12-23 LAB — COMPREHENSIVE METABOLIC PANEL
ALK PHOS: 106 U/L (ref 39–117)
ALT: 41 U/L — AB (ref 0–35)
AST: 77 U/L — AB (ref 0–37)
Albumin: 3.2 g/dL — ABNORMAL LOW (ref 3.5–5.2)
BUN: 6 mg/dL (ref 6–23)
CO2: 30 meq/L (ref 19–32)
CREATININE: 0.56 mg/dL (ref 0.50–1.10)
Calcium: 9.7 mg/dL (ref 8.4–10.5)
Chloride: 99 mEq/L (ref 96–112)
Glucose, Bld: 89 mg/dL (ref 70–99)
Potassium: 4.2 mEq/L (ref 3.5–5.3)
SODIUM: 134 meq/L — AB (ref 135–145)
Total Bilirubin: 1.1 mg/dL (ref 0.2–1.2)
Total Protein: 8.9 g/dL — ABNORMAL HIGH (ref 6.0–8.3)

## 2014-12-23 LAB — LIPASE: Lipase: 59 U/L (ref 0–75)

## 2014-12-23 NOTE — Patient Instructions (Addendum)
It was nice to meet you today. I think that your cough and runny nose may be related to a cold or your allergies. Continue to take your allergy medicine. If you develop fevers or difficulty breathing, back to see [place].  I think that your belly pain may be related to inflammation of your liver related to heavy alcohol use and hepatitis C. I'm getting some labs today to check your liver and pancreas. I'm also referring you to the hepatitis clinic. We will also get an ultrasound of your liver. If you develop fevers, uncontrolled vomiting, or unable to tolerate food, please go to the emergency department.  In the mean time, it is important to avoid alcohol and tylenol.  Take care, Dr. B  Hepatitis C Hepatitis C is a viral infection of the liver. Infection may go undetected for months or years because symptoms may be absent or very mild. Chronic liver disease is the main danger of hepatitis C. This may lead to scarring of the liver (cirrhosis), liver failure, and liver cancer. CAUSES  Hepatitis C is caused by the hepatitis C virus (HCV). Formerly, hepatitis C infections were most commonly transmitted through blood transfusions. In the early 1990s, routine testing of donated blood for hepatitis C and exclusion of blood that tests positive for HCV began. Now, HCV is most commonly transmitted from person to person through injection drug use, sharing needles, or sex with an infected person. A caregiver may also get the infection from exposure to the blood of an infected patient by way of a cut or needle stick.  SYMPTOMS  Acute Phase Many cases of acute HCV infection are mild and cause few problems.Some people may not even realize they are sick.Symptoms in others may last a few weeks to several months and include:  Feeling very tired.  Loss of appetite.  Nausea.  Vomiting.  Abdominal pain.  Dark yellow urine.  Yellow skin and eyes (jaundice).  Itching of the skin. Chronic Phase  Between 50%  to 85% of people who get HCV infection become "chronic carriers." They often have no symptoms, but the virus stays in their body.They may spread the virus to others and can get long-term liver disease.  Many people with chronic HCV infection remain healthy for many years. However, up to 1 in 5 chronically infected people may develop severe liver diseases including scarring of the liver (cirrhosis), liver failure, or liver cancer. DIAGNOSIS  Diagnosis of hepatitis C infection is made by testing blood for the presence of hepatitis C viral particles called RNA. Other tests may also be done to measure the status of current liver function, exclude other liver problems, or assess liver damage. TREATMENT  Treatment with many antiviral drugs is available and recommended for some patients with chronic HCV infection. Drug treatment is generally considered appropriate for patients who:  Are 62 years of age or older.  Have a positive test for HCV particles in the blood.  Have a liver tissue sample (biopsy) that shows chronic hepatitis and significant scarring (fibrosis).  Do not have signs of liver failure.  Have acceptable blood test results that confirm the wellness of other body organs.  Are willing to be treated and conform to treatment requirements.  Have no other circumstances that would prevent treatment from being recommended (contraindications). All people who are offered and choose to receive drug treatment must understand that careful medical follow up for many months and even years is crucial in order to make successful care possible. The  goal of drug treatment is to eliminate any evidence of HCV in the blood on a long-term basis. This is called a "sustained virologic response" or SVR. Achieving a SVR is associated with a decrease in the chance of life-threatening liver problems, need for a liver transplant, liver cancer rates, and liver-related complications. Successful treatment currently  requires taking treatment drugs for at least 24 weeks and up to 72 weeks. An injected drug (interferon) given weekly and an oral antiviral medicine taken daily are usually prescribed. Side effects from these drugs are common and some may be very serious. Your response to treatment must be carefully monitored by both you and your caregiver throughout the entire treatment period. PREVENTION There is no vaccine for hepatitis C. The only way to prevent the disease is to reduce the risk of exposure to the virus.   Avoid sharing drug needles or personal items like toothbrushes, razors, and nail clippers with an infected person.  Healthcare workers need to avoid injuries and wear appropriate protective equipment such as gloves, gowns, and face masks when performing invasive medical or nursing procedures. HOME CARE INSTRUCTIONS  To avoid making your liver disease worse:  Strictly avoid drinking alcohol.  Carefully review all new prescriptions of medicines with your caregiver. Ask your caregiver which drugs you should avoid. The following drugs are toxic to the liver, and your caregiver may tell you to avoid them:  Isoniazid.  Methyldopa.  Acetaminophen.  Anabolic steroids (muscle-building drugs).  Erythromycin.  Oral contraceptives (birth control pills).  Check with your caregiver to make sure medicine you are currently taking will not be harmful.  Periodic blood tests may be required. Follow your caregiver's advice about when you should have blood tests.  Avoid a sexual relationship until advised otherwise by your caregiver.  Avoid activities that could expose other people to your blood. Examples include sharing a toothbrush, nail clippers, razors, and needles.  Bed rest is not necessary, but it may make you feel better. Recovery time is not related to the amount of rest you receive.  This infection is contagious. Follow your caregiver's instructions in order to avoid spread of the  infection. SEEK IMMEDIATE MEDICAL CARE IF:  You have increasing fatigue or weakness.  You have an oral temperature above 102 F (38.9 C), not controlled by medicine.  You develop loss of appetite, nausea, or vomiting.  You develop jaundice.  You develop easy bruising or bleeding.  You develop any severe problems as a result of your treatment. MAKE SURE YOU:   Understand these instructions.  Will watch your condition.  Will get help right away if you are not doing well or get worse. Document Released: 09/09/2000 Document Revised: 12/05/2011 Document Reviewed: 12/25/2013 [facility] Patient Information 2015 [place], [place]. This information is not intended to replace advice given to you by your health care provider. Make sure you discuss any questions you have with your health care provider.

## 2014-12-23 NOTE — Progress Notes (Signed)
   Subjective:   Samreen Seltzer is a 59 y.o. female with a history of HCV, depression, HTN, allergic rhinitis here for same day appt for abd pain and nasal congestion.  Abd pain: - Drinking "too much" alcohol over the weekend - 4 fifths and multiple beers on Friday and Saturday and Sunday - Stomach swollen and firm yesterday, but improved some today - No N/V - 2 hard stools then softer, no blood - Able to tolerate PO intake - abd pain in lower RUQ  Congestion: - Sore throat, productive cough with yellow or clear mucous, HAs, rhinorrhea x2 wks -Has not had sick contacts -Denies any fevers  HTN - Taking lisinopril-HCTZ 40-25 and amlodipine 5mg  daily - Didn't take antihypertensives today - Recent negative stress test - No CP, SOB, blurry vision  Review of Systems:  Per HPI. All other systems reviewed and are negative.   PMH, PSH, Medications, Allergies, and FmHx reviewed and updated in EMR.  Social History: current smoker  Objective:  BP 178/92 mmHg  Pulse 97  Temp(Src) 98 F (36.7 C) (Oral)  Ht 5\' 3"  (1.6 m)  Wt 125 lb (56.7 kg)  BMI 22.15 kg/m2  Gen:  59 y.o. female in NAD, coughing intermittently HEENT: NCAT, MMM, EOMI, PERRL, anicteric sclerae, OP clear, nasal turbinates inflamed CV: RRR, SEM at RUSB Resp: Non-labored, CTAB, no wheezes noted Abd: Soft, mildly distended, BS present, TTP in RUQ and RLQ, NT in epigastrium.  Patient will not let me palpate for liver edge 2/2 pain. Ext: WWP, no edema Neuro: Alert and oriented, speech normal     Assessment:     Janyth Riera is a 59 y.o. female here for abd pain and URI symptoms.    Plan:     See problem list for problem-specific plans.   Virginia Crews, MD PGY-1,  [facility] 12/23/2014  3:12 PM

## 2014-12-23 NOTE — Assessment & Plan Note (Signed)
Positive antibody and elevated quantitative RNA in 03/2014 Referred to Hep C clinic

## 2014-12-23 NOTE — Assessment & Plan Note (Signed)
Patient with productive cough and other URI symptoms likely related to viral process or allergic rhinitis Could also be related to tobacco abuse with possible COPD or chronic bronchitis Afebrile and normal pulmonary exam pneumonia unlikely Consider PFTs to evaluate for COPD in the future Continue daily Zyrtec

## 2014-12-23 NOTE — Assessment & Plan Note (Signed)
Currently uncontrolled, patient did not take antihypertensives this morning Continue current antihypertensive regimen Return precautions given Follow-up in one month with PCP

## 2014-12-23 NOTE — Assessment & Plan Note (Signed)
Advised patient to avoid all alcohol intake in setting of possible pancreatitis or liver inflammation

## 2014-12-23 NOTE — Assessment & Plan Note (Signed)
Concern for inflammatory process of the liver with moderate tenderness to palpation in right upper and right lower quadrant on exam Obtain CMET, CBC, lipase to evaluate liver and pancreas Right upper quadrant ultrasound Referral to hepatitis C clinic Avoid all alcohol and Tylenol intake Return precautions given

## 2014-12-24 ENCOUNTER — Other Ambulatory Visit: Payer: Self-pay | Admitting: Family Medicine

## 2014-12-24 ENCOUNTER — Telehealth: Payer: Self-pay | Admitting: Family Medicine

## 2014-12-24 DIAGNOSIS — D649 Anemia, unspecified: Secondary | ICD-10-CM

## 2014-12-24 NOTE — Telephone Encounter (Signed)
Gave message to patient.  Again encouraged her to avoid alcohol and tylenol.  Virginia Crews, MD, MPH PGY-1,  [facility] 12/24/2014 3:15 PM

## 2014-12-24 NOTE — Telephone Encounter (Signed)
Patient returned phone call.  Please call back due to abnormal results

## 2014-12-24 NOTE — Telephone Encounter (Signed)
Called patient to discuss lab results from recent visit.  No answer, left VM.  If patient calls back, please relay these results:  AST/ALT (liver function tests) elevated slightly, which shows some liver injury.  Lipase (pancreas function) is normal, so no pancreatitis.  CBC stable with no signs of infection.  We will await the liver US for more information.    Virginia Crews, MD, MPH PGY-1,  [facility] 12/24/2014 12:15 PM

## 2014-12-25 ENCOUNTER — Telehealth: Payer: Self-pay | Admitting: *Deleted

## 2014-12-25 NOTE — Telephone Encounter (Signed)
Pt is aware of results and lab appt scheduled for 12/29/2014. Laresa Oshiro,CMA

## 2014-12-25 NOTE — Telephone Encounter (Signed)
-----   Message from Leone Haven, MD sent at 12/24/2014  5:37 PM EDT ----- Patients hgb remains mildly low. Her iron is in the low normal range. This is likely related to her chronic liver disease, though I would like for her to come back for additional blood work to ensure that there are no other causes to be found. I will place orders and the patient will need to be scheduled for lab appointment. Please inform the patient. Thanks.

## 2014-12-29 ENCOUNTER — Telehealth: Payer: Self-pay | Admitting: *Deleted

## 2014-12-29 ENCOUNTER — Other Ambulatory Visit: Payer: Self-pay | Admitting: Family Medicine

## 2014-12-29 ENCOUNTER — Other Ambulatory Visit: Payer: No Typology Code available for payment source

## 2014-12-29 DIAGNOSIS — B1920 Unspecified viral hepatitis C without hepatic coma: Secondary | ICD-10-CM

## 2014-12-29 DIAGNOSIS — D649 Anemia, unspecified: Secondary | ICD-10-CM

## 2014-12-29 DIAGNOSIS — R011 Cardiac murmur, unspecified: Secondary | ICD-10-CM

## 2014-12-29 LAB — IRON AND TIBC
%SAT: 4 % — AB (ref 20–55)
IRON: 16 ug/dL — AB (ref 42–145)
TIBC: 362 ug/dL (ref 250–470)
UIBC: 346 ug/dL (ref 125–400)

## 2014-12-29 LAB — RETICULOCYTES
ABS Retic: 30.2 10*3/uL (ref 19.0–186.0)
RBC.: 3.77 MIL/uL — ABNORMAL LOW (ref 3.87–5.11)
Retic Ct Pct: 0.8 % (ref 0.4–2.3)

## 2014-12-29 NOTE — Telephone Encounter (Signed)
Called patient regarding referral for Hep C from [facility] and she is having labs today at their clinic. Tried to call MD line to have labs added on for Dr. Linus Salmons. Patient still needs Hep A antibody total, hep B Core antibody total, hep B surface antibody, Hep B surface antigen, ANA, Iron, and Hiv antibody.  He would also like the Hep C virus quant (reflex to genotype) done. The last Hep C viral only was done over 6 months ago in July of 2016. Patient will ask them to call this clinic or just add the labs. I also notice that on 01/05/15 she is having an ultrasound complete; Dr. Linus Salmons would be ordering an ultrasound/elastography and maybe possibly that could be added. Myrtis Hopping

## 2014-12-29 NOTE — Progress Notes (Signed)
[facility] phlebotomist drew:  HBsAb,  HBsAg, HBcAb - Total,  Hep A Ab - Total, Hep C Ab reflex,  HIV,  Retic, Fe & IBC, path smear review,  ANA

## 2014-12-29 NOTE — Progress Notes (Signed)
Patient scheduled in [facility] lab today.  Added on labs that Dr. Linus Salmons would like for Hep C clinic.  Hep A antibody total hep B Core antibody total hep B surface antibody Hep B surface antigen ANA Iron Hiv antibody Hep C virus quant (reflex to genotype)  Also changed abd [place] to [place] and elastography.  Will ask CMA to call us scheduler and note change in study.  Virginia Crews, MD, MPH PGY-1,  [facility] 12/29/2014 11:30 AM

## 2014-12-30 LAB — ANA: ANA: POSITIVE — AB

## 2014-12-30 LAB — HEPATITIS A ANTIBODY, TOTAL: Hep A Total Ab: NONREACTIVE

## 2014-12-30 LAB — HEPATITIS B SURFACE ANTIBODY,QUALITATIVE: Hep B S Ab: NEGATIVE

## 2014-12-30 LAB — PATHOLOGIST SMEAR REVIEW

## 2014-12-30 LAB — ANTI-NUCLEAR AB-TITER (ANA TITER)

## 2014-12-30 LAB — HEPATITIS B CORE ANTIBODY, TOTAL: Hep B Core Total Ab: NONREACTIVE

## 2014-12-30 LAB — HIV ANTIBODY (ROUTINE TESTING W REFLEX): HIV 1&2 Ab, 4th Generation: NONREACTIVE

## 2014-12-30 LAB — HCV RNA QUANT RFLX ULTRA OR GENOTYP
HCV Quantitative Log: 6.23 {Log} — ABNORMAL HIGH (ref <1.18)
HCV Quantitative: 1691353 IU/mL — ABNORMAL HIGH (ref <15)

## 2014-12-30 LAB — HEPATITIS B SURFACE ANTIGEN: Hepatitis B Surface Ag: NEGATIVE

## 2014-12-30 NOTE — Progress Notes (Signed)
Spoke with patient, informed her that we had to reschedule ultrasound due to change in type. Ultrasound now will now be 4/26 at 9am at [facility], patient aware.

## 2014-12-30 NOTE — Addendum Note (Signed)
Addended by: Levert Feinstein F on: 12/30/2014 09:16 AM   Modules accepted: Orders

## 2015-01-01 ENCOUNTER — Ambulatory Visit (HOSPITAL_COMMUNITY): Payer: No Typology Code available for payment source

## 2015-01-02 ENCOUNTER — Telehealth: Payer: Self-pay | Admitting: Family Medicine

## 2015-01-02 DIAGNOSIS — D649 Anemia, unspecified: Secondary | ICD-10-CM

## 2015-01-02 LAB — HEPATITIS C GENOTYPE

## 2015-01-02 NOTE — Telephone Encounter (Signed)
Called and informed patient of possible hemolysis findings on smear review. Also of iron deficiency. Will start on iron supplement. Will have patient come in on 4/11 at 11 am for haptoglobin and LDH to pursue possible hemolysis as cause of anemia.

## 2015-01-05 ENCOUNTER — Other Ambulatory Visit: Payer: No Typology Code available for payment source

## 2015-01-05 DIAGNOSIS — D649 Anemia, unspecified: Secondary | ICD-10-CM

## 2015-01-05 LAB — LACTATE DEHYDROGENASE: LDH: 191 U/L (ref 94–250)

## 2015-01-06 LAB — HAPTOGLOBIN: HAPTOGLOBIN: 66 mg/dL (ref 43–212)

## 2015-01-09 ENCOUNTER — Other Ambulatory Visit: Payer: Self-pay | Admitting: Family Medicine

## 2015-01-09 ENCOUNTER — Encounter: Payer: Self-pay | Admitting: Family Medicine

## 2015-01-09 MED ORDER — FERROUS SULFATE 325 (65 FE) MG PO TABS: 325.0000 mg | ORAL_TABLET | Freq: Every day | ORAL | Status: AC

## 2015-01-13 ENCOUNTER — Telehealth: Payer: Self-pay | Admitting: *Deleted

## 2015-01-13 NOTE — Telephone Encounter (Signed)
Patient scheduled appointment for 02/11/15 at 10:40 AM.

## 2015-01-13 NOTE — Telephone Encounter (Signed)
Called and left patient a voice mail to call back. She had all her Hep C labs done at PCP and needs new patient appt. May 18, 16  Nine am, 10:20, and 10:40 slots are still available. Myrtis Hopping

## 2015-01-20 ENCOUNTER — Ambulatory Visit (HOSPITAL_COMMUNITY)
Admission: RE | Admit: 2015-01-20 | Discharge: 2015-01-20 | Disposition: A | Discharge status: Home / Self Care | Payer: No Typology Code available for payment source | Source: Ambulatory Visit | Attending: Family Medicine | Admitting: Family Medicine

## 2015-01-20 DIAGNOSIS — B1920 Unspecified viral hepatitis C without hepatic coma: Secondary | ICD-10-CM

## 2015-01-20 DIAGNOSIS — K769 Liver disease, unspecified: Secondary | ICD-10-CM | POA: Insufficient documentation

## 2015-01-20 DIAGNOSIS — B182 Chronic viral hepatitis C: Secondary | ICD-10-CM | POA: Insufficient documentation

## 2015-01-20 NOTE — Progress Notes (Signed)
Reviewed patient's [place] results.  Results consistent with hepatocellular disease liekly 2/2 HCV infection that is known.  Patient scheduled to follow-up with Dr. Linus Salmons in hepatitis clinic on 5/18.  They can discuss these results at that time and what they mean for prognosis and such.  Virginia Crews, MD, MPH PGY-1,  [facility] 01/20/2015 5:10 PM

## 2015-01-22 ENCOUNTER — Encounter: Payer: Self-pay | Admitting: Family Medicine

## 2015-02-11 ENCOUNTER — Encounter: Payer: Self-pay | Admitting: Internal Medicine

## 2015-02-11 ENCOUNTER — Ambulatory Visit (INDEPENDENT_AMBULATORY_CARE_PROVIDER_SITE_OTHER): Payer: No Typology Code available for payment source | Admitting: Internal Medicine

## 2015-02-11 VITALS — BP 165/80 | HR 84 | Temp 98.2°F | Ht 64.5 in | Wt 127.0 lb

## 2015-02-11 DIAGNOSIS — B182 Chronic viral hepatitis C: Secondary | ICD-10-CM

## 2015-02-11 DIAGNOSIS — Z23 Encounter for immunization: Secondary | ICD-10-CM

## 2015-02-11 MED ORDER — LEDIPASVIR-SOFOSBUVIR 90-400 MG PO TABS: 1.0000 | ORAL_TABLET | Freq: Every day | ORAL | Status: DC

## 2015-02-11 NOTE — Progress Notes (Signed)
+Lisa [name] is a 59 y.o. female who presents for initial evaluation and management of a positive Hepatitis C antibody test.  Patient tested positive about 1 year ago. Hepatitis C risk factors present are: IV drug abuse (details: more than 20 years ago). Patient denies history of blood transfusion. Patient has had other studies performed. Results: hepatitis C RNA by PCR, result: positive, elastography F3/4 with concern for cirrhosis noted. Patient has not had prior treatment for Hepatitis C. Patient does have a past history of liver disease. Patient does not have a family history of liver disease.   HPI: She had work up.  Daily drinker and I discussed results of cirrhosis, absolute need to stop drinking.  Also drug use.    Patient does not have documented immunity to Hepatitis A. Patient does not have documented immunity to Hepatitis B.     Review of Systems A comprehensive review of systems was negative.   Past Medical History  Diagnosis Date  . Osteopetrosis   . Hepatitis C   . Tobacco use   . Alcohol abuse, daily use   . HTN (hypertension)   . Poor dental hygiene   . Mitral valve prolapse   . Hx of cardiovascular stress test     Lexiscan Myoview 3/16: No ischemia, EF 66%, normal study  . Leg pain     ABIs 3/16:  normal     Prior to Admission medications   Medication Sig Start Date End Date Taking? Authorizing Provider  amLODipine (NORVASC) 10 MG tablet Take 1 tablet (10 mg total) by mouth daily. 11/07/14  Yes Leone Haven, MD  aspirin 81 MG tablet Take 81 mg by mouth daily.   Yes Historical Provider, MD  cetirizine (ZYRTEC) 10 MG tablet Take 1 tablet (10 mg total) by mouth daily. 11/04/14  Yes Leone Haven, MD  ferrous sulfate 325 (65 FE) MG tablet Take 1 tablet (325 mg total) by mouth daily with breakfast. 01/09/15  Yes Leone Haven, MD  lisinopril-hydrochlorothiazide (PRINZIDE,ZESTORETIC) 20-12.5 MG per tablet Take 2 tablets by mouth daily. 11/04/14  Yes Leone Haven, MD  Olopatadine HCl 0.2 % SOLN Apply 1 drop to eye daily. 11/04/14  Yes Leone Haven, MD  Ledipasvir-Sofosbuvir (HARVONI) 90-400 MG TABS Take 1 tablet by mouth daily. 02/11/15   Thayer Headings, MD  traMADol (ULTRAM) 50 MG tablet Take 1 tablet (50 mg total) by mouth every 8 (eight) hours as needed. Patient not taking: Reported on 02/11/2015 12/09/14   Leone Haven, MD    Allergies  Allergen Reactions  . Celecoxib Hives  . Sulfonamide Derivatives Hives    History  Substance Use Topics  . Smoking status: Current Every Day Smoker -- 0.50 packs/day    Types: Cigarettes  . Smokeless tobacco: Never Used  . Alcohol Use: 0.0 oz/week    0 Standard drinks or equivalent per week     Comment: Drinks daily, two 40 oz beer daily    Family History  Problem Relation Age of Onset  . Cancer Father     Prostate  . Heart attack Neg Hx   . Stroke Sister       Objective:   Filed Vitals:   02/11/15 1027  BP: 165/80  Pulse: 84  Temp: 98.2 F (36.8 C)   in no apparent distress and alert HEENT: anicteric Cor RRR and No murmurs clear Bowel sounds are normal, liver is not enlarged, spleen is not enlarged peripheral pulses normal, no pedal  edema, no clubbing or cyanosis negative for - jaundice, spider hemangioma, telangiectasia, palmar erythema, ecchymosis and atrophy  Laboratory Genotype:  Lab Results  Component Value Date   HCVGENOTYPE 1b 12/29/2014   HCV viral load:  Lab Results  Component Value Date   HCVQUANT 0511021* 12/29/2014   Lab Results  Component Value Date   WBC 8.3 12/23/2014   HGB 11.2* 12/23/2014   HCT 36.0 12/23/2014   MCV 82.6 12/23/2014   PLT 479* 12/23/2014    Lab Results  Component Value Date   CREATININE 0.56 12/23/2014   BUN 6 12/23/2014   NA 134* 12/23/2014   K 4.2 12/23/2014   CL 99 12/23/2014   CO2 30 12/23/2014    Lab Results  Component Value Date   ALT 41* 12/23/2014   AST 77* 12/23/2014   ALKPHOS 106 12/23/2014    BILITOT 1.1 12/23/2014   INR 1.24 12/09/2014      Assessment: Chronic Hepatitis C genotype 1b  Plan: 1) Patient counseled extensively on limiting acetaminophen to no more than 2 grams daily, avoidance of alcohol. 2) Transmission discussed with patient including sexual transmission, sharing razors and toothbrush.   3) Will need referral to gastroenterology if concern for cirrhosis 4) Will need referral for substance abuse counseling: Yes.   5) Will prescribe Harvoni for 12 weeks through PAP 6) Hepatitis A vaccine Yes.   7) Hepatitis B vaccine Yes.   8) Pneumovax vaccine - yes 9) will follow up after starting medication

## 2015-02-11 NOTE — Patient Instructions (Addendum)
Date 02/11/2015  Dear Ms Rex Kras, As discussed in the [facility], your hepatitis C therapy will include the following medications:          Harvoni 90mg /400mg  tablet:           Take 1 tablet by mouth once daily   Please note that ALL MEDICATIONS WILL START ON THE SAME DATE for a total of 12 weeks. ---------------------------------------------------------------- Your HCV Treatment Start Date: TBA   Your HCV genotype:  1b    Liver Fibrosis:  cirrhosis  ---------------------------------------------------------------- YOUR PHARMACY CONTACT:   [place] [facility] [place] Phone: [phone] Hours: Monday to Friday 7:30 am to 6:00 pm   Please always contact your pharmacy at least 3-4 business days before you run out of medications to ensure your next month's medication is ready or 1 week prior to running out if you receive it by mail.  Remember, each prescription is for 28 days. ---------------------------------------------------------------- GENERAL NOTES REGARDING YOUR HEPATITIS C MEDICATION:  SOFOSBUVIR/LEDIPASVIR (HARVONI): - Harvoni tablet is taken daily with OR without food. - The tablets are orange. - The tablets should be stored at room temperature.  - Acid reducing agents such as H2 blockers (ie. Pepcid (famotidine), Zantac (ranitidine), Tagamet (cimetidine), Axid (nizatidine) and proton pump inhibitors (ie. Prilosec (omeprazole), Protonix (pantoprazole), Nexium (esomeprazole), or Aciphex (rabeprazole)) can decrease effectiveness of Harvoni. Do not take until you have discussed with a health care provider.    -Antacids that contain magnesium and/or aluminum hydroxide (ie. Milk of Magensia, Rolaids, Gaviscon, Maalox, Mylanta, an dArthritis Pain Formula)can reduce absorption of Harvoni, so take them at least 4 hours before or after Harvoni.  -Calcium carbonate (calcium supplements or antacids such as Tums, Caltrate,  Os-Cal)needs to be taken at least 4 hours hours before or after Harvoni.  -St. John's wort or any products that contain St. John's wort like some herbal supplements  Please inform the office prior to starting any of these medications.  - The common side effects with Harvoni:      1. Fatigue      2. Headache      3. Nausea      4. Diarrhea      5. Insomnia   Support Path is a suite of resources designed to help patients start with HARVONI and move toward treatment completion [place] helps patients access therapy and get off to an efficient start  Benefits investigation and prior authorization support Co-pay and other financial assistance A specialty pharmacy finder CO-PAY COUPON The [place] co-pay coupon may help eligible patients lower their out-of-pocket costs. With a co-pay coupon, most eligible patients may pay no more than $5 per co-pay (restrictions apply) www.harvoni.com call 364-046-7105 Not valid for patients enrolled in government healthcare prescription drug programs, such as Medicare Part D and Medicaid. Patients in the coverage gap known as the "donut hole" also are not eligible The HARVONI co-pay coupon program will cover the out-of-pocket costs for HARVONI prescriptions up to a maximum of 25% of the catalog price of a 12-week regimen of HARVONI  Please note that this only lists the most common side effects and is NOT a comprehensive list of the potential side effects of these medications. For more information, please review the drug information sheets that come with your medication package from the pharmacy.  ---------------------------------------------------------------- GENERAL HELPFUL HINTS ON HCV THERAPY: 1. No alcohol. 2. Protect against sun-sensitivity/sunburns (wear sunglasses, hat, long sleeves, pants and  sunscreen). 3. Stay well-hydrated/well-moisturized. 4. Notify the ID Clinic of any changes in your other over-the-counter/herbal or  prescription medications. 5. If you miss a dose of your medication, take the missed dose as soon as you remember. Return to your regular time/dose schedule the next day.  6.  Do not stop taking your medications without first talking with your healthcare provider. 7.  You may take Tylenol (acetaminophen), as long as the dose is less than 2000 mg (OR no more than 4 tablets of the Tylenol Extra Strengths 500mg  tablet) in 24 hours. 8.  You will need to obtain routine labs and/or office visits at [facility] at weeks 4 and 12 as well as 12 and 24 weeks after completion of treatment.   Scharlene Gloss, [place] for [place], [place]  [address] [phone]

## 2015-02-16 ENCOUNTER — Ambulatory Visit (INDEPENDENT_AMBULATORY_CARE_PROVIDER_SITE_OTHER): Payer: No Typology Code available for payment source | Admitting: Interventional Cardiology

## 2015-02-16 ENCOUNTER — Encounter: Payer: Self-pay | Admitting: Interventional Cardiology

## 2015-02-16 VITALS — BP 132/64 | HR 96 | Ht 64.0 in | Wt 123.0 lb

## 2015-02-16 DIAGNOSIS — I10 Essential (primary) hypertension: Secondary | ICD-10-CM

## 2015-02-16 DIAGNOSIS — Z720 Tobacco use: Secondary | ICD-10-CM

## 2015-02-16 DIAGNOSIS — I340 Nonrheumatic mitral (valve) insufficiency: Secondary | ICD-10-CM

## 2015-02-16 DIAGNOSIS — F1010 Alcohol abuse, uncomplicated: Secondary | ICD-10-CM

## 2015-02-16 NOTE — Patient Instructions (Signed)
**Note De-Identified  Obfuscation** Medication Instructions:  Same-no change  Labwork: None  Testing/Procedures: None  Follow-Up: Your physician wants you to follow-up in: 1 year. You will receive a reminder letter in the mail two months in advance. If you don't receive a letter, please call our office to schedule the follow-up appointment.   Any Other Special Instructions Will Be Listed Below (If Applicable). STOP SMOKING.

## 2015-02-16 NOTE — Progress Notes (Signed)
Patient ID: Lisa [name], female   DOB: 1956-09-18, 59 y.o.   MRN: [id number]     Cardiology Office Note   Date:  02/16/2015   ID:  Lisa [name], DOB Sep 10, 1956, MRN [id number]  PCP:  Tommi Rumps, MD    No chief complaint on file. f/u HTN, MVP   Wt Readings from Last 3 Encounters:  02/16/15 123 lb (55.792 kg)  02/11/15 127 lb (57.607 kg)  12/23/14 125 lb (56.7 kg)       History of Present Illness: Lisa [name] is a 59 y.o. female  who has had difficult to control HTN. She had a stress test in the past that apparently was normal. She was scheduled to get an echo a few weeks ago but did not have transportation.   She has had HTN requiring meds for 5- years. More recently, BP has been better. First lisinopril / HCTZ was increased. THen Amlodipne was prescribed but was not initially started by the patient. She eventually started it and now gets BPs in the 140s at home. No chest pain. She is depressed.  SHe is being for Hep C and has cirrhosis.    She has family stress. She has stress from bills As well. She does not sleep well at night. She does add salt to food regularly.   Difuse aches all over her body including her chest. No exertional chest pain.  She does not walk regularly.  She does have to walk to the bus stop.  SHe has foot pain and other joint pains that limit her.   She still smokes and drinks.  She has cut back on both.       Past Medical History  Diagnosis Date  . Osteopetrosis   . Hepatitis C   . Tobacco use   . Alcohol abuse, daily use   . HTN (hypertension)   . Poor dental hygiene   . Mitral valve prolapse   . Hx of cardiovascular stress test     Lexiscan Myoview 3/16: No ischemia, EF 66%, normal study  . Leg pain     ABIs 3/16:  normal     Past Surgical History  Procedure Laterality Date  . Abdominal hysterectomy    . Laparoscopy    . Rod in right arm    . Carpal tunnel release       Current Outpatient Prescriptions    Medication Sig Dispense Refill  . amLODipine (NORVASC) 10 MG tablet Take 1 tablet (10 mg total) by mouth daily. 90 tablet 2  . aspirin 81 MG tablet Take 81 mg by mouth daily.    . cetirizine (ZYRTEC) 10 MG tablet Take 1 tablet (10 mg total) by mouth daily. 30 tablet 11  . ferrous sulfate 325 (65 FE) MG tablet Take 1 tablet (325 mg total) by mouth daily with breakfast. 90 tablet 3  . Ledipasvir-Sofosbuvir (HARVONI) 90-400 MG TABS Take 1 tablet by mouth daily. 28 tablet 2  . lisinopril-hydrochlorothiazide (PRINZIDE,ZESTORETIC) 20-12.5 MG per tablet Take 2 tablets by mouth daily. 180 tablet 1  . Olopatadine HCl 0.2 % SOLN Apply 1 drop to eye daily. 2.5 mL 2  . traMADol (ULTRAM) 50 MG tablet Take 1 tablet (50 mg total) by mouth every 8 (eight) hours as needed. 60 tablet 1   No current facility-administered medications for this visit.    Allergies:   Sulfamethoxazole; Celecoxib; and Sulfonamide derivatives    Social History:  The patient  reports that she has been smoking Cigarettes.  She has been smoking about 0.50 packs per day. She has never used smokeless tobacco. She reports that she drinks alcohol. She reports that she uses illicit drugs (Marijuana and Cocaine).   Family History:  The patient's *family history includes Cancer in her father; Stroke in her sister. There is no history of Heart attack.    ROS:  Please see the history of present illness.   Otherwise, review of systems are positive for diffuse aching.   All other systems are reviewed and negative.    PHYSICAL EXAM: VS:  BP 132/64 mmHg  Pulse 96  Ht 5\' 4"  (1.626 m)  Wt 123 lb (55.792 kg)  BMI 21.10 kg/m2 , BMI Body mass index is 21.1 kg/(m^2). GEN: Well nourished, well developed, in no acute distress HEENT: normal Neck: no JVD, carotid bruits, or masses Cardiac: RRR; no murmurs, rubs, or gallops,no edema  Respiratory:  clear to auscultation bilaterally, normal work of breathing GI: soft, nontender, nondistended, +  BS MS: no deformity or atrophy Skin: warm and dry, no rash Neuro:  Strength and sensation are intact Psych: euthymic mood, full affect   EKG:   The ekg ordered in 2/16 showed NSR with no ST changes   Recent Labs: 03/31/2014: TSH 0.605 12/23/2014: ALT 41*; BUN 6; Creatinine 0.56; Hemoglobin 11.2*; Platelets 479*; Potassium 4.2; Sodium 134*   Lipid Panel    Component Value Date/Time   CHOL 152 04/24/2014 0953   TRIG 88 04/24/2014 0953   HDL 46 04/24/2014 0953   CHOLHDL 3.3 04/24/2014 0953   VLDL 18 04/24/2014 0953   LDLCALC 88 04/24/2014 0953   LDLDIRECT 94 01/24/2013 1501     Other studies Reviewed: Additional studies/ records that were reviewed today with results demonstrating: ECG in 2/16.   ASSESSMENT AND PLAN:  1. HTN: Continue amlodipine 10 mg.  BP controlled now. Consider adding beta blocker if BP not controled on full dose amlodipine.  2. Murmur: Mitral regurg, she was told she had MVP in the past.  Repeat echo in the fall of 2017. 3. Chest pain: atypical. Does not sound lke ischemia. Start with RF modification and ten see how she feels.  4. Tobacco: I recommended that she stop smoking.  1. The patient was counseled on the dangers of tobacco use, both inhaled and oral, which include, but are not limited to cardiovascular disease, increased cancer risk of multiple types of cancer, COPD, peripheral vascular disease, strokes. 2. She was also counseled on the benefits of smoking cessation. 3. The patient was firmly advised to quit.    4. We also reviewed strategies to maximize success, including:  Removing cigarettes and smoking materials from environment  Stress management  Substitution of other forms of reinforcement Support of family/friends.  Selecting a quit date.  Patient provided contact information for 1-800-QUIT-NOW          Current medicines are reviewed at length with the patient today.  The patient concerns regarding her medicines were  addressed.  The following changes have been made:  No change  Labs/ tests ordered today include:  No orders of the defined types were placed in this encounter.    Recommend 150 minutes/week of aerobic exercise Low fat, low carb, high fiber diet recommended  Disposition:   FU in 1 year   Teresita Madura., MD  02/16/2015 3:18 PM    [facility] [place], [place], [place]  [address] Phone: [phone]; Fax: [phone]

## 2015-02-18 ENCOUNTER — Telehealth: Payer: Self-pay | Admitting: Family Medicine

## 2015-02-18 DIAGNOSIS — K7460 Unspecified cirrhosis of liver: Secondary | ICD-10-CM

## 2015-02-18 NOTE — Telephone Encounter (Deleted)
-----   Message from Willeen Niece, MD sent at 01/01/2015  7:59 AM EDT -----   ----- Message -----    From: Lab in [facility] Interface    Sent: 12/29/2014  10:12 PM      To: Willeen Niece, MD

## 2015-02-18 NOTE — Telephone Encounter (Signed)
Called patient to follow-up on ID visit. [place] with signs of cirrhosis. ID is starting patient on treatment for hep c. Will need referral to GI to help in management of cirrhosis. Will place this referral. Patient will call back at her request to schedule follow-up appointment with [place] in clinic in one month.

## 2015-02-19 ENCOUNTER — Encounter: Payer: Self-pay | Admitting: Gastroenterology

## 2015-02-19 ENCOUNTER — Ambulatory Visit: Payer: No Typology Code available for payment source | Admitting: *Deleted

## 2015-03-05 ENCOUNTER — Ambulatory Visit (INDEPENDENT_AMBULATORY_CARE_PROVIDER_SITE_OTHER): Payer: Self-pay | Admitting: Family Medicine

## 2015-03-05 ENCOUNTER — Encounter: Payer: Self-pay | Admitting: Family Medicine

## 2015-03-05 VITALS — BP 150/74 | HR 80 | Temp 98.1°F | Ht 64.0 in | Wt 124.0 lb

## 2015-03-05 DIAGNOSIS — J309 Allergic rhinitis, unspecified: Secondary | ICD-10-CM

## 2015-03-05 DIAGNOSIS — F32A Depression, unspecified: Secondary | ICD-10-CM

## 2015-03-05 DIAGNOSIS — R52 Pain, unspecified: Secondary | ICD-10-CM

## 2015-03-05 DIAGNOSIS — H539 Unspecified visual disturbance: Secondary | ICD-10-CM

## 2015-03-05 DIAGNOSIS — R05 Cough: Secondary | ICD-10-CM

## 2015-03-05 DIAGNOSIS — F329 Major depressive disorder, single episode, unspecified: Secondary | ICD-10-CM

## 2015-03-05 DIAGNOSIS — R61 Generalized hyperhidrosis: Secondary | ICD-10-CM

## 2015-03-05 DIAGNOSIS — R059 Cough, unspecified: Secondary | ICD-10-CM

## 2015-03-05 DIAGNOSIS — M2550 Pain in unspecified joint: Secondary | ICD-10-CM

## 2015-03-05 DIAGNOSIS — R079 Chest pain, unspecified: Secondary | ICD-10-CM

## 2015-03-05 MED ORDER — TRAMADOL HCL 50 MG PO TABS: 50.0000 mg | ORAL_TABLET | Freq: Three times a day (TID) | ORAL | Status: DC | PRN

## 2015-03-05 MED ORDER — FLUTICASONE PROPIONATE 50 MCG/ACT NA SUSP: 2.0000 | Freq: Every day | NASAL | Status: DC

## 2015-03-05 NOTE — Progress Notes (Signed)
Patient is due for a screening mammogram.  Information on imaging center given to patient to call and schedule mammogram appointment.   

## 2015-03-05 NOTE — Patient Instructions (Addendum)
Nice to see you. We are going to obtain some blood work to look for a cause of your joint aches.  Please go get the chest x-ray to evaluate your cough. I would like for you to come back next week for a blood pressure check.  If you develop fever, shortness of breath, chest pain, change in vision, thoughts of hurting yourself or anyone else please seek medical attention.

## 2015-03-06 LAB — TSH: TSH: 0.862 u[IU]/mL (ref 0.350–4.500)

## 2015-03-06 LAB — SEDIMENTATION RATE: Sed Rate: 14 mm/hr (ref 0–30)

## 2015-03-06 LAB — CBC
HCT: 32.8 % — ABNORMAL LOW (ref 36.0–46.0)
Hemoglobin: 10.4 g/dL — ABNORMAL LOW (ref 12.0–15.0)
MCH: 25.7 pg — ABNORMAL LOW (ref 26.0–34.0)
MCHC: 31.7 g/dL (ref 30.0–36.0)
MCV: 81 fL (ref 78.0–100.0)
MPV: 12.2 fL (ref 8.6–12.4)
Platelets: 365 10*3/uL (ref 150–400)
RBC: 4.05 MIL/uL (ref 3.87–5.11)
RDW: 17.3 % — ABNORMAL HIGH (ref 11.5–15.5)
WBC: 6.8 10*3/uL (ref 4.0–10.5)

## 2015-03-06 LAB — RHEUMATOID FACTOR

## 2015-03-06 LAB — ANTI-DNA ANTIBODY, DOUBLE-STRANDED: ds DNA Ab: 1 IU/mL

## 2015-03-06 LAB — ANTI-SMITH ANTIBODY: ENA SM Ab Ser-aCnc: <1

## 2015-03-07 LAB — QUANTIFERON TB GOLD ASSAY (BLOOD)
Interferon Gamma Release Assay: NEGATIVE
Quantiferon Nil Value: 0.04 IU/mL
Quantiferon Tb Ag Minus Nil Value: 0 IU/mL
TB Ag value: 0.04 IU/mL

## 2015-03-10 DIAGNOSIS — R52 Pain, unspecified: Secondary | ICD-10-CM | POA: Insufficient documentation

## 2015-03-10 NOTE — Assessment & Plan Note (Signed)
Patient with diffuse aching joint pain. No obvious abnormalities on exam. Will check labs for RA, lupus, thyroid, and inflammation. Continue tramadol for pain control. Given return precautions.

## 2015-03-10 NOTE — Assessment & Plan Note (Signed)
Continues to have symptoms of allergic rhinitis. Will trial flonase for this.

## 2015-03-10 NOTE — Progress Notes (Signed)
Patient ID: Lisa [name], female   DOB: [birth year], 59 y.o.   MRN: [id number]  Tommi Rumps, MD Phone: [phone]  Lisa [name] is a 59 y.o. female who presents today for f/u.  Allergic rhinitis: patient notes that she has itchy watery eyes, rhinorrhea, and nasal congestion. Present for the past year. No vision changes or eye pain. No eye redness. Tried allergy eye drops with [place] benefit. No other medication tried.   Depression: notes this has been present lately since she found out about her cirrhosis. Has had in the past. No SI or HI. Has been sleeping more. Never on medications in the past or seen a therapist. Endorses poor appetite, feeling bad about herself, [place] energy, and feeling depressed. PHQ9 9.  Aching: patient notes she is aching all over. In her arms elbows, toes. Has been going on since she [facility]. Just hurts at random times. No weight loss. Does note some sweats and hot flashes, though notes she had a total hysterectomy in the past. No swelling or erythema of joints. No fevers. Does note mild cough with clear productive phlegm. No dyspnea.   Chest pain: for past 2 days. Notes it is an aching pressure in the center of her chest. Occurs with just sitting there. No dyspnea, diaphoresis, history of PE, inactivity, surgery, or calf swelling. No chest pain or shortness of breath at this time. Had stress test with cardiology in March that was normal.   PMH: smoker.   ROS: Per HPI   Physical Exam Filed Vitals:   03/05/15 1452  BP: 150/74  Pulse:   Temp:     Gen: Well NAD HEENT: PERRL,  MMM, no scleral injection, no eye discharge, normal TMs, mild nasal mucosa edema, no cervical LAD Lungs: CTABL Nl WOB Heart: RRR, 2/6 systolic murmur noted  MSK: no joint swelling in hands, wrists, elbows, knees or feet, she is non-tender in these joints, she is tender in the left costochondral region with reproduction of her pain Exts: Non edematous BL   LE, warm and well perfused.    Assessment/Plan: Please see individual problem list.  Tommi Rumps, MD [place] PGY-3

## 2015-03-10 NOTE — Assessment & Plan Note (Signed)
Suspect muscular component with tenderness of chest wall and noted aching all over. Doubt cardiac etiology given normal stress test in the past several months. Unlikely PE with lack of dyspnea and normal HR with no history of clot. Doubt other pulmonary process, though will check CXR and quantiferon gold with cough and hot flashes. EKG reassuring. Given return precautions.

## 2015-03-16 ENCOUNTER — Telehealth: Payer: Self-pay | Admitting: Family Medicine

## 2015-03-16 ENCOUNTER — Ambulatory Visit (INDEPENDENT_AMBULATORY_CARE_PROVIDER_SITE_OTHER): Payer: No Typology Code available for payment source | Admitting: *Deleted

## 2015-03-16 DIAGNOSIS — M2550 Pain in unspecified joint: Secondary | ICD-10-CM

## 2015-03-16 DIAGNOSIS — Z23 Encounter for immunization: Secondary | ICD-10-CM

## 2015-03-16 DIAGNOSIS — D649 Anemia, unspecified: Secondary | ICD-10-CM

## 2015-03-16 NOTE — Patient Instructions (Signed)
Return in 4 months for Hep B #3 and Hep A #2.

## 2015-03-16 NOTE — Progress Notes (Signed)
Hep B #2 injection

## 2015-03-16 NOTE — Telephone Encounter (Signed)
Copy of ultrasound faxed to [facility] and patient is aware of this. Jazmin Hartsell,CMA

## 2015-03-16 NOTE — Telephone Encounter (Signed)
Needs most recent ultrasound, cannot find in epic

## 2015-03-18 NOTE — Telephone Encounter (Signed)
Called patient to discuss lab work. Advised that rheumatologic work up was negative. Advised that if she is continuing to have aching all over we can refer her to rheumatology for further evaluation. She stated she would like this. Discussed that her blood count remains low. She denies rectal bleeding and has an appointment with GI later this month. She does report bleeding from her gums sometimes, though no other sites of bleeding. Will obtain smear, INR, and von willebrand studies given report of mucosal bleeding. Suspect this anemia is related to anemia of chronic disease from her hep c, though must further evaluate.

## 2015-03-19 ENCOUNTER — Other Ambulatory Visit: Payer: Self-pay | Admitting: Pharmacist

## 2015-03-21 NOTE — Assessment & Plan Note (Addendum)
Patient with mild depression relating to recent diagnoses. Will monitor this issue for now as patient starts treatment and evaluation for chronic medical issues. Given return precautions.

## 2015-03-25 ENCOUNTER — Ambulatory Visit (INDEPENDENT_AMBULATORY_CARE_PROVIDER_SITE_OTHER): Payer: Self-pay | Admitting: Gastroenterology

## 2015-03-25 ENCOUNTER — Encounter: Payer: Self-pay | Admitting: Gastroenterology

## 2015-03-25 VITALS — BP 140/80 | HR 76 | Ht 64.0 in | Wt 119.0 lb

## 2015-03-25 DIAGNOSIS — B182 Chronic viral hepatitis C: Secondary | ICD-10-CM

## 2015-03-25 DIAGNOSIS — D509 Iron deficiency anemia, unspecified: Secondary | ICD-10-CM

## 2015-03-25 NOTE — Progress Notes (Signed)
    History of Present Illness: This is a 59 year old female referred by Leone Haven, MD for the evaluation of hepatitis C with suspected fibrosis and iron deficiency anemia. Patient is being evaluated and possibly treated at the ID clinic for hepatitis C. Has ongoing etoh abuse and prior substance abuse. She has tried to decrease etoh intake.   Ultrasound with elastography: IMPRESSION: No evidence of gallstones or biliary ductal dilatation. Diffuse hepatocellular disease. No hepatic mass visualized sonographically. Median hepatic shear wave velocity is calculated at 3.74 m/sec. Corresponding Metavir fibrosis score is some F3 +F4. Risk of fibrosis is high.  Normal INR and platelet count normal. She was recently found to have an iron deficiency anemia. Colonoscopy at [facility] on 11/25/2011 for anemia was normal. Mild constipation. Notes frequent gum bleeding for about 1 year.  Denies weight loss, abdominal pain, diarrhea, change in stool caliber, melena, hematochezia, nausea, vomiting, dysphagia, reflux symptoms, chest pain.  Review of Systems: Pertinent positive and negative review of systems were noted in the above HPI section. All other review of systems were otherwise negative.  Current Medications, Allergies, Past Medical History, Past Surgical History, Family History and Social History were reviewed in [employer] record.  Physical Exam: General: Well developed, well nourished, no acute distress Head: Normocephalic and atraumatic Eyes:  sclerae anicteric, EOMI Ears: Normal auditory acuity Mouth: No deformity or lesions, poor dentition Neck: Supple, no masses or thyromegaly Lungs: Clear throughout to auscultation Heart: Regular rate and rhythm; no murmurs, rubs or bruits Abdomen: Soft, non tender and non distended. No masses, hepatosplenomegaly or hernias noted. Normal Bowel sounds Rectal:  Deferred to colonoscopy Musculoskeletal: Symmetrical with no gross  deformities  Skin: No lesions on visible extremities Pulses:  Normal pulses noted Extremities: No clubbing, cyanosis, edema or deformities noted Neurological: Alert oriented x 4, grossly nonfocal Cervical Nodes:  No significant cervical adenopathy Inguinal Nodes: No significant inguinal adenopathy Psychological:  Alert and cooperative. Normal mood and affect  Assessment and Recommendations:  1. Hepatitis C with high likelihood of fibrosis by ultrasound with elastography. Etoh abuse. No typical findings compatible with cirrhosis on laboratory or physical exam however she could have early cirrhosis. Liver biopsy could definitively evaluate the extent of fibrosis but this information is not critical at this time. She is not interested in proceeding with liver biopsy at this time. DC all etoh use and etoh abstinence support via her PCP. Follow up at ID clinic for mgmt of Hepatitis C.   2. Iron deficiency anemia, chronic gum bleeding. Colonoscopy 3 years ago at [facility] was normal. Rule out upper gastrointestinal source of blood loss however blood loss appears likely to be due to chronic gum bleeding. Continue Fe daily. Follow up with PCP for further evaluation. Schedule upper endoscopy. This will also allow to screen for varices or portal gastropathy which would be compatible with early cirrhosis. The risks (including bleeding, perforation, infection, missed lesions, medication reactions and possible hospitalization or surgery if complications occur), benefits, and alternatives to endoscopy with possible biopsy and possible dilation were discussed with the patient and they consent to proceed.    cc: Leone Haven, MD [address] [place], [place] [address]

## 2015-03-25 NOTE — Patient Instructions (Signed)
You have been scheduled for an endoscopy. Please follow written instructions given to you at your visit today. If you use inhalers (even only as needed), please bring them with you on the day of your procedure. Your physician has requested that you go to www.startemmi.com and enter the access code given to you at your visit today. This web site gives a general overview about your procedure. However, you should still follow specific instructions given to you by our office regarding your preparation for the procedure.  Thank you for choosing me and [facility].  Malcolm T. Stark, Jr., MD., FACG  

## 2015-03-27 ENCOUNTER — Ambulatory Visit: Payer: No Typology Code available for payment source | Admitting: Family Medicine

## 2015-04-02 ENCOUNTER — Telehealth: Payer: Self-pay | Admitting: Pharmacist Clinician (PhC)/ Clinical Pharmacy Specialist

## 2015-04-02 NOTE — Telephone Encounter (Signed)
Patient called and said that she has missed about 4 doses of Zepatier. She still has the 2nd pack. We really don't have choice here. Told her to cont the regimen and call the patient assistance pharmacy today to see if they can send her 2nd month early.

## 2015-05-12 ENCOUNTER — Other Ambulatory Visit: Payer: No Typology Code available for payment source

## 2015-05-12 DIAGNOSIS — B1710 Acute hepatitis C without hepatic coma: Secondary | ICD-10-CM

## 2015-05-12 LAB — CBC WITH DIFFERENTIAL/PLATELET
BASOS ABS: 0 10*3/uL (ref 0.0–0.1)
BASOS PCT: 0 % (ref 0–1)
EOS ABS: 0.1 10*3/uL (ref 0.0–0.7)
Eosinophils Relative: 2 % (ref 0–5)
HCT: 32.6 % — ABNORMAL LOW (ref 36.0–46.0)
HEMOGLOBIN: 10 g/dL — AB (ref 12.0–15.0)
Lymphocytes Relative: 53 % — ABNORMAL HIGH (ref 12–46)
Lymphs Abs: 2.6 10*3/uL (ref 0.7–4.0)
MCH: 25.3 pg — ABNORMAL LOW (ref 26.0–34.0)
MCHC: 30.7 g/dL (ref 30.0–36.0)
MCV: 82.5 fL (ref 78.0–100.0)
MONOS PCT: 23 % — AB (ref 3–12)
MPV: 11.5 fL (ref 8.6–12.4)
Monocytes Absolute: 1.1 10*3/uL — ABNORMAL HIGH (ref 0.1–1.0)
NEUTROS ABS: 1.1 10*3/uL — AB (ref 1.7–7.7)
NEUTROS PCT: 22 % — AB (ref 43–77)
PLATELETS: 261 10*3/uL (ref 150–400)
RBC: 3.95 MIL/uL (ref 3.87–5.11)
RDW: 21.3 % — ABNORMAL HIGH (ref 11.5–15.5)
WBC: 4.9 10*3/uL (ref 4.0–10.5)

## 2015-05-14 LAB — HEPATITIS C RNA QUANTITATIVE: HCV QUANT: NOT DETECTED [IU]/mL (ref <15)

## 2015-05-18 ENCOUNTER — Ambulatory Visit (AMBULATORY_SURGERY_CENTER): Payer: Self-pay | Admitting: Gastroenterology

## 2015-05-18 ENCOUNTER — Encounter: Payer: Self-pay | Admitting: Gastroenterology

## 2015-05-18 VITALS — BP 195/93 | HR 73 | Temp 97.9°F | Resp 18 | Ht 64.0 in | Wt 119.0 lb

## 2015-05-18 DIAGNOSIS — B9681 Helicobacter pylori [H. pylori] as the cause of diseases classified elsewhere: Secondary | ICD-10-CM

## 2015-05-18 DIAGNOSIS — K2970 Gastritis, unspecified, without bleeding: Secondary | ICD-10-CM

## 2015-05-18 DIAGNOSIS — D509 Iron deficiency anemia, unspecified: Secondary | ICD-10-CM

## 2015-05-18 MED ORDER — SODIUM CHLORIDE 0.9 % IV SOLN: 500.0000 mL | INTRAVENOUS | Status: DC

## 2015-05-18 NOTE — Op Note (Signed)
[place]  [address]. [place], [address]   ENDOSCOPY PROCEDURE REPORT  PATIENT: Lisa [name], Lisa [name]  MR#: [id number] BIRTHDATE: [birth year] , 59  yrs. old GENDER: female ENDOSCOPIST: Ladene Artist, MD, [facility] REFERRED BY:  Tommi Rumps, MD PROCEDURE DATE:  05/18/2015 PROCEDURE:  EGD w/ biopsy ASA CLASS:     Class III INDICATIONS:  iron deficiency anemia and screening for varices. MEDICATIONS: Monitored anesthesia care and Propofol 200 mg IV TOPICAL ANESTHETIC: none DESCRIPTION OF PROCEDURE: After the risks benefits and alternatives of the procedure were thoroughly explained, informed consent was obtained.  The LB [id number] endoscope was introduced through the mouth and advanced to the second portion of the duodenum , Without limitations.  The instrument was slowly withdrawn as the mucosa was fully examined.    STOMACH: Mild erosive gastritis (inflammation) was found in the gastric antrum. Mutliple biopsies were performed.  Gastritis vs gastropathy was found in the body and fundus.  Multiple biopsies were performed.   The stomach otherwise appeared normal. ESOPHAGUS: The mucosa of the esophagus appeared normal.  No varices noted. DUODENUM: The duodenal mucosa showed no abnormalities in the bulb and 2nd part of the duodenum.  Retroflexed views revealed no abnormalities.     The scope was then withdrawn from the patient and the procedure completed.  COMPLICATIONS: There were no immediate complications.  ENDOSCOPIC IMPRESSION: 1.   Erosive gastritis in the gastric antrum; multiple biopsies performed 2.   Gastritis vs gastropathy in the gastric body and fundus; multiple biopsies performed 3.   The EGD otherwise appeared normal  RECOMMENDATIONS: 1.  Await pathology results 2.  Avoid/minimize NSAIDs 3.  Follow up with PCP   eSigned:  Ladene Artist, MD, [facility] 05/18/2015 2:31 PM  [C

## 2015-05-18 NOTE — Progress Notes (Signed)
Called to room to assist during endoscopic procedure.  Patient ID and intended procedure confirmed with present staff. Received instructions for my participation in the procedure from the performing physician.  

## 2015-05-18 NOTE — Progress Notes (Signed)
Report to PACU, RN, vss, BBS= Clear.  

## 2015-05-18 NOTE — Patient Instructions (Signed)
Discharge instructions given. Biopsies taken. Avoid/minimize NSAIDS. Resume previous medications. YOU HAD AN ENDOSCOPIC PROCEDURE TODAY AT [facility]:   Refer to the procedure report that was given to you for any specific questions about what was found during the examination.  If the procedure report does not answer your questions, please call your gastroenterologist to clarify.  If you requested that your care partner not be given the details of your procedure findings, then the procedure report has been included in a sealed envelope for you to review at your convenience later.  YOU SHOULD EXPECT: Some feelings of bloating in the abdomen. Passage of more gas than usual.  Walking can help get rid of the air that was put into your GI tract during the procedure and reduce the bloating. If you had a lower endoscopy (such as a colonoscopy or flexible sigmoidoscopy) you may notice spotting of blood in your stool or on the toilet paper. If you underwent a bowel prep for your procedure, you may not have a normal bowel movement for a few days.  Please Note:  You might notice some irritation and congestion in your nose or some drainage.  This is from the oxygen used during your procedure.  There is no need for concern and it should clear up in a day or so.  SYMPTOMS TO REPORT IMMEDIATELY:    Following upper endoscopy (EGD)  Vomiting of blood or coffee ground material  New chest pain or pain under the shoulder blades  Painful or persistently difficult swallowing  New shortness of breath  Fever of 100F or higher  Black, tarry-looking stools  For urgent or emergent issues, a gastroenterologist can be reached at any hour by calling [phone].   DIET: Your first meal following the procedure should be a small meal and then it is ok to progress to your normal diet. Heavy or fried foods are harder to digest and may make you feel nauseous or bloated.  Likewise, meals heavy in dairy and  vegetables can increase bloating.  Drink plenty of fluids but you should avoid alcoholic beverages for 24 hours.  ACTIVITY:  You should plan to take it easy for the rest of today and you should NOT DRIVE or use heavy machinery until tomorrow (because of the sedation medicines used during the test).    FOLLOW UP: Our staff will call the number listed on your records the next business day following your procedure to check on you and address any questions or concerns that you may have regarding the information given to you following your procedure. If we do not reach you, we will leave a message.  However, if you are feeling well and you are not experiencing any problems, there is no need to return our call.  We will assume that you have returned to your regular daily activities without incident.  If any biopsies were taken you will be contacted by phone or by letter within the next 1-3 weeks.  Please call us at [phone] if you have not heard about the biopsies in 3 weeks.    SIGNATURES/CONFIDENTIALITY: You and/or your care partner have signed paperwork which will be entered into your electronic medical record.  These signatures attest to the fact that that the information above on your After Visit Summary has been reviewed and is understood.  Full responsibility of the confidentiality of this discharge information lies with you and/or your care-partner.

## 2015-05-19 ENCOUNTER — Telehealth: Payer: Self-pay

## 2015-05-19 NOTE — Telephone Encounter (Signed)
  Follow up Call-  Call back number 05/18/2015  Post procedure Call Back phone  # [phone]  Permission to leave phone message Yes     Patient questions:  Do you have a fever, pain , or abdominal swelling? No. Pain Score  0 *  Have you tolerated food without any problems? Yes.    Have you been able to return to your normal activities? Yes.    Do you have any questions about your discharge instructions: Diet   No. Medications  No. Follow up visit  No.  Do you have questions or concerns about your Care? No.  Actions: * If pain score is 4 or above: No action needed, pain <4.

## 2015-05-21 ENCOUNTER — Other Ambulatory Visit: Payer: Self-pay | Admitting: Family Medicine

## 2015-05-21 MED ORDER — TRAMADOL HCL 50 MG PO TABS: 50.0000 mg | ORAL_TABLET | Freq: Three times a day (TID) | ORAL | Status: DC | PRN

## 2015-05-21 NOTE — Telephone Encounter (Signed)
Needs refill on tramadol Please call when ready for pickup

## 2015-05-21 NOTE — Telephone Encounter (Signed)
Spoke with patient and informed her that script is ready for pick up.  She would like for [place] to fax this script. Jazmin Hartsell,CMA

## 2015-05-21 NOTE — Telephone Encounter (Signed)
Refilled. CGM MD 

## 2015-05-21 NOTE — Telephone Encounter (Signed)
Will forward to MD. Jazmin Hartsell,CMA  

## 2015-05-26 ENCOUNTER — Ambulatory Visit (INDEPENDENT_AMBULATORY_CARE_PROVIDER_SITE_OTHER): Payer: No Typology Code available for payment source | Admitting: Internal Medicine

## 2015-05-26 ENCOUNTER — Telehealth: Payer: Self-pay | Admitting: *Deleted

## 2015-05-26 ENCOUNTER — Ambulatory Visit: Payer: No Typology Code available for payment source | Admitting: *Deleted

## 2015-05-26 ENCOUNTER — Encounter: Payer: Self-pay | Admitting: Internal Medicine

## 2015-05-26 VITALS — BP 175/75 | HR 70 | Temp 98.0°F | Wt 121.0 lb

## 2015-05-26 DIAGNOSIS — F1010 Alcohol abuse, uncomplicated: Secondary | ICD-10-CM

## 2015-05-26 DIAGNOSIS — K7400 Hepatic fibrosis, unspecified: Secondary | ICD-10-CM

## 2015-05-26 DIAGNOSIS — K2970 Gastritis, unspecified, without bleeding: Principal | ICD-10-CM

## 2015-05-26 DIAGNOSIS — B182 Chronic viral hepatitis C: Secondary | ICD-10-CM

## 2015-05-26 DIAGNOSIS — F1210 Cannabis abuse, uncomplicated: Secondary | ICD-10-CM

## 2015-05-26 DIAGNOSIS — F411 Generalized anxiety disorder: Secondary | ICD-10-CM

## 2015-05-26 DIAGNOSIS — K7460 Unspecified cirrhosis of liver: Secondary | ICD-10-CM | POA: Insufficient documentation

## 2015-05-26 DIAGNOSIS — B9681 Helicobacter pylori [H. pylori] as the cause of diseases classified elsewhere: Secondary | ICD-10-CM

## 2015-05-26 DIAGNOSIS — F1410 Cocaine abuse, uncomplicated: Secondary | ICD-10-CM

## 2015-05-26 DIAGNOSIS — R0789 Other chest pain: Secondary | ICD-10-CM

## 2015-05-26 MED ORDER — OMEPRAZOLE 20 MG PO CPDR: DELAYED_RELEASE_CAPSULE | ORAL | Status: DC

## 2015-05-26 MED ORDER — BIS SUBCIT-METRONID-TETRACYC 140-125-125 MG PO CAPS: ORAL_CAPSULE | ORAL | Status: DC

## 2015-05-26 NOTE — BH Specialist Note (Signed)
Lisa [name] was present today for her scheduled appointment.  Client was oriented times four with good affect and dress.  Client was alert and talkative.  Client stated that she has a lot of anxiety right now due to several things that have been going on in her life such as: brother on life support, health issues regarding Hep C and eye problems, poly substance abuse, interpersonal relationship problems with children, feeling alone, still grieving her father's death etc.  Client admitted that she drinks about 2 40's every day and smokes marijuana a few times a week. Client says that she has used cocaine for a few weeks now and is trying to stop.  Client shared that she had substance abuse treatment when she was in prison back in 2005 but started using again a couple of weeks after she got out. Client says that she knows she should talk to someone but has a hard time accepting help.  Client states that she knows she is hard headed. Counselor educated client on the ramifications of using multiple substances while having so many health issues and already being on serious medication.  Client admitted that she has not been taking her blood pressure medication consistently because of being slack and doesn't want to pay for it.  Client says that the blood pressure was coming down and she felt she didn't need it any more.  Counselor communicated to client that the medication is what brought it down that it is unlikely it would come down on its own at this point. Client said that she would consider meeting with counselor in the near future.  Counselor does not feel that client is likely to make an appointment any time soon and is only in the pre contemplation stage of change right now regarding her substance abuse.  Counselor provided support and encouraged client to call when she was ready and make an appointment.   Rolena Infante, LPCA, MA [facility]

## 2015-05-26 NOTE — Assessment & Plan Note (Signed)
[place] screen every 6 months.

## 2015-05-26 NOTE — Assessment & Plan Note (Signed)
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

## 2015-05-26 NOTE — Assessment & Plan Note (Signed)
End of treatment is negative.  Will check in 4-5 months again for SVR12/24.  Follow up after lab.  Will need [place] every 6 months and will arrange next visit.

## 2015-05-26 NOTE — Telephone Encounter (Signed)
Pt. Notified that Rx. Sent in for pylera 3 po qid x10days and omeprazole 20 mg bid x 10days and then 1 po every a.m. X 30 days. Pamphlet for  Helicobacter pylori being placed in mail for pt. Per request.

## 2015-05-26 NOTE — Progress Notes (Signed)
   Subjective:    Patient ID: Lisa [name], female    DOB: May 25, 1956, 59 y.o.   MRN: [id number]  HPI  Here for follow up of hepatitis C.  Started on Zepatier in the Spring and completed 12 weeks through [facility].  This is her first time back and viral load after finishing is negative.  Still drinking.  Brother in hospital in [place].  Does complain of chest pain associated with movement of left arm.  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    Review of Systems  Constitutional: Negative for fatigue.  Cardiovascular:       Chest pain that is palpable  Gastrointestinal: Negative for nausea and diarrhea.  Skin: Negative for rash.  Neurological: Negative for dizziness and light-headedness.       Objective:   Physical Exam  Constitutional: She appears well-developed and well-nourished. No distress.  HENT:  Mouth/Throat: No oropharyngeal exudate.  Eyes: No scleral icterus.  Cardiovascular: Normal rate, regular rhythm and normal heart sounds.   No murmur heard. Left sided chest pain that is palpable with touch, sharp  Pulmonary/Chest: Effort normal and breath sounds normal. No respiratory distress.  Lymphadenopathy:    She has no cervical adenopathy.  Skin: No rash noted.          Assessment & Plan:

## 2015-05-27 ENCOUNTER — Telehealth: Payer: Self-pay

## 2015-05-27 DIAGNOSIS — K2970 Gastritis, unspecified, without bleeding: Principal | ICD-10-CM

## 2015-05-27 DIAGNOSIS — B9681 Helicobacter pylori [H. pylori] as the cause of diseases classified elsewhere: Secondary | ICD-10-CM

## 2015-05-27 MED ORDER — OMEPRAZOLE 20 MG PO CPDR: DELAYED_RELEASE_CAPSULE | ORAL | Status: DC

## 2015-05-27 MED ORDER — METRONIDAZOLE 250 MG PO TABS: 250.0000 mg | ORAL_TABLET | Freq: Two times a day (BID) | ORAL | Status: DC

## 2015-05-27 MED ORDER — DOXYCYCLINE HYCLATE 100 MG PO CAPS: 100.0000 mg | ORAL_CAPSULE | Freq: Two times a day (BID) | ORAL | Status: DC

## 2015-05-27 MED ORDER — BISMUTH SUBSALICYLATE 262 MG PO CHEW: CHEWABLE_TABLET | ORAL | Status: DC

## 2015-05-27 NOTE — Telephone Encounter (Signed)
Faxed form back stating we will send in alternative medication in place of Pylera.

## 2015-05-29 ENCOUNTER — Telehealth: Payer: Self-pay | Admitting: Gastroenterology

## 2015-05-29 NOTE — Telephone Encounter (Signed)
Informed patient that I received a fax from her pharmacy stating the Pylera was too expensive and then I sent in other prescriptions to the pharmacy in place of Pylera. Patient states she has orange card and the price is 41 dollars according to the automated call she got from the pharmacy. I asked patient if that was for the Pylera or what is the other medications that I sent in yesterday. Patient states she is not sure but the 41 dollars can be hard for her to pay right now. I told her that unfortunately I already sent in the other option in place of Pylera. Patient wanted to name of each medication and spelled out for her. Patient states she will contact the pharmacy and find out which choice is cheaper option.Told her to contact our office if she has any other questions. Pt verbalized understanding.

## 2015-07-08 ENCOUNTER — Ambulatory Visit: Payer: No Typology Code available for payment source

## 2015-07-14 ENCOUNTER — Ambulatory Visit: Payer: No Typology Code available for payment source

## 2015-08-17 ENCOUNTER — Telehealth: Payer: Self-pay | Admitting: Gastroenterology

## 2015-08-17 NOTE — Telephone Encounter (Signed)
Patient reports reflux symptoms.  She was treated for h pylori earlier this summer.  Patient instructed to maintain an anti-reflux diet. Advised to avoid caffeine, mint, citrus foods/juices, tomatoes,  chocolate, NSAIDS/ASA products.  Instructed not to eat within 2 hours of exercise or bed, multiple small meals are better than 3 large meals.  Need to take PPI 30 minutes prior to 1st meal of the day. She will try OTC Prilosec.  She will call back if her symptoms fail to improve.

## 2015-09-15 ENCOUNTER — Encounter: Payer: Self-pay | Admitting: Internal Medicine

## 2015-09-15 ENCOUNTER — Ambulatory Visit (INDEPENDENT_AMBULATORY_CARE_PROVIDER_SITE_OTHER): Payer: No Typology Code available for payment source | Admitting: Internal Medicine

## 2015-09-15 VITALS — BP 216/104 | HR 81 | Temp 98.3°F | Wt 121.0 lb

## 2015-09-15 DIAGNOSIS — I10 Essential (primary) hypertension: Secondary | ICD-10-CM

## 2015-09-15 DIAGNOSIS — Z23 Encounter for immunization: Secondary | ICD-10-CM

## 2015-09-15 DIAGNOSIS — Z720 Tobacco use: Secondary | ICD-10-CM

## 2015-09-15 DIAGNOSIS — K7400 Hepatic fibrosis, unspecified: Secondary | ICD-10-CM

## 2015-09-15 DIAGNOSIS — F1010 Alcohol abuse, uncomplicated: Secondary | ICD-10-CM

## 2015-09-15 DIAGNOSIS — B182 Chronic viral hepatitis C: Secondary | ICD-10-CM

## 2015-09-15 NOTE — Addendum Note (Signed)
Addended by: Myrtis Hopping A on: 09/15/2015 11:55 AM   Modules accepted: Orders

## 2015-09-15 NOTE — Assessment & Plan Note (Signed)
I will check a screening US for [facility] screen.  rtc 6 months and I will repeat that.

## 2015-09-15 NOTE — Progress Notes (Signed)
   Subjective:    Patient ID: Lisa [name], female    DOB: [birth year], 60 y.o.   MRN: [id number]  HPI  Here for follow up of hepatitis C.    Started on Zepatier in the Spring and completed 12 weeks through [facility]. Her viral load after finishing was negative.  Had some fatigue but no headache and did well through treatment.   Still drinking. 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  Sees Dr. Fuller Plan of GI.  Still smoking.  Has not been back to see her PCP and not taking her BP meds.  BP very high today.    Review of Systems  Constitutional: Negative for fatigue.  Gastrointestinal: Negative for nausea and diarrhea.  Skin: Negative for rash.  Neurological: Negative for dizziness, light-headedness and headaches.  Psychiatric/Behavioral: Negative for confusion.       Objective:   Physical Exam  Constitutional: She appears well-developed and well-nourished. No distress.  Eyes: No scleral icterus.  Cardiovascular: Normal rate, regular rhythm and normal heart sounds.   No murmur heard. Pulmonary/Chest: Effort normal and breath sounds normal. No respiratory distress.  Skin: No rash noted.   Social History   Social History  . Marital Status: Divorced    Spouse Name: N/A  . Number of Children: 1  . Years of Education: N/A   Occupational History  . unemployed    Social History Main Topics  . Smoking status: Current Every Day Smoker -- 0.50 packs/day    Types: Cigarettes  . Smokeless tobacco: Never Used     Comment: patient states "not ready yet"  . Alcohol Use: 0.0 oz/week    0 Standard drinks or equivalent per week     Comment: Drinks daily, two 40 oz beer daily  . Drug Use: Yes    Special: Marijuana, Cocaine     Comment: Past history of IV drug use  . Sexual Activity: Not Currently   Other Topics Concern  . Not on file   Social History Narrative   Lives alone in [place] in an apartment. 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 No current source of income. Goes to [employer] for help as needed.   One daughter, age 81. Six grandchildren, one great-grand child.           Assessment & Plan:

## 2015-09-15 NOTE — Assessment & Plan Note (Signed)
I encouraged her to completely quit

## 2015-09-15 NOTE — Assessment & Plan Note (Signed)
She is going to restart her BP meds and get back to her PCP.  I told her with any headache, confusion, to go to the ED asap.

## 2015-09-15 NOTE — Assessment & Plan Note (Signed)
Will check SVR12 today to check for cure.

## 2015-09-16 LAB — HEPATITIS C RNA QUANTITATIVE: HCV QUANT: NOT DETECTED [IU]/mL (ref <15)

## 2015-09-17 ENCOUNTER — Telehealth: Payer: Self-pay | Admitting: *Deleted

## 2015-09-17 NOTE — Telephone Encounter (Signed)
Patient notified

## 2015-09-17 NOTE — Telephone Encounter (Signed)
-----   Message from Thayer Headings, MD sent at 09/17/2015 12:16 PM EST ----- Please let her know that her HCV virus remained undetectable.  thanks

## 2015-10-06 ENCOUNTER — Ambulatory Visit: Payer: No Typology Code available for payment source

## 2015-10-14 ENCOUNTER — Encounter: Payer: Self-pay | Admitting: Family Medicine

## 2015-10-14 ENCOUNTER — Ambulatory Visit: Payer: Self-pay

## 2015-10-14 NOTE — Progress Notes (Signed)
Thanks, I'm happy to see her whenever she is able to come in. Thanks for talking with her. We'll address at her next appointment.   CGM MD

## 2015-10-14 NOTE — Progress Notes (Signed)
Patient was in clinic today for recertification of her orange card and was missing a few items required before she can receive it.  She asked me to check her blood pressure since it has been high at Dr. Henreitta Leber office.  Advised patient that I would check it but that she would need an appt to be seen for her blood pressure concerns.  Patient hasn't been in clinic since her previous provider left in June 2016.  Her bp today was 220/86 and pulse was 78.  Patient denies any chest pain and has had some shortness of breath when she gets active.  Offered patient an appt today to be seen or any day this week for her bp but she declined due to not wanting to have a bill.  Patient was then advised to bring her missing paperwork asap so we could get her bp treated asap.  She did say that she has been taking her lisinopril as prescribed. Jazmin Hartsell,CMA

## 2015-10-16 NOTE — Progress Notes (Signed)
Unable to reach patient to inform her of message from MD.  Will try calling again at the end of the day. Jazmin Hartsell,CMA

## 2015-10-16 NOTE — Progress Notes (Signed)
Number listed for patient is not working, will wait and inform her of need for an appt when she brings the rest of her paperwork for her OC. Jazmin Hartsell,CMA

## 2015-10-28 ENCOUNTER — Ambulatory Visit: Payer: No Typology Code available for payment source | Admitting: Gastroenterology

## 2015-11-20 ENCOUNTER — Ambulatory Visit: Payer: No Typology Code available for payment source

## 2015-12-08 ENCOUNTER — Ambulatory Visit: Payer: Self-pay

## 2015-12-24 ENCOUNTER — Encounter: Payer: Self-pay | Admitting: Family Medicine

## 2015-12-24 ENCOUNTER — Ambulatory Visit (INDEPENDENT_AMBULATORY_CARE_PROVIDER_SITE_OTHER): Payer: No Typology Code available for payment source | Admitting: Family Medicine

## 2015-12-24 VITALS — BP 212/82 | HR 94 | Temp 98.6°F | Ht 64.0 in | Wt 122.5 lb

## 2015-12-24 DIAGNOSIS — I10 Essential (primary) hypertension: Secondary | ICD-10-CM

## 2015-12-24 DIAGNOSIS — K219 Gastro-esophageal reflux disease without esophagitis: Secondary | ICD-10-CM

## 2015-12-24 DIAGNOSIS — B182 Chronic viral hepatitis C: Secondary | ICD-10-CM

## 2015-12-24 DIAGNOSIS — L299 Pruritus, unspecified: Secondary | ICD-10-CM

## 2015-12-24 DIAGNOSIS — F1010 Alcohol abuse, uncomplicated: Secondary | ICD-10-CM

## 2015-12-24 LAB — CBC
HEMATOCRIT: 38.8 % (ref 36.0–46.0)
HEMOGLOBIN: 12.3 g/dL (ref 12.0–15.0)
MCH: 27.9 pg (ref 26.0–34.0)
MCHC: 31.7 g/dL (ref 30.0–36.0)
MCV: 88 fL (ref 78.0–100.0)
MPV: 12.3 fL (ref 8.6–12.4)
Platelets: 320 10*3/uL (ref 150–400)
RBC: 4.41 MIL/uL (ref 3.87–5.11)
RDW: 18 % — ABNORMAL HIGH (ref 11.5–15.5)
WBC: 4.4 10*3/uL (ref 4.0–10.5)

## 2015-12-24 MED ORDER — HYDROXYZINE HCL 10 MG PO TABS: 10.0000 mg | ORAL_TABLET | Freq: Three times a day (TID) | ORAL | Status: DC | PRN

## 2015-12-24 MED ORDER — LISINOPRIL 40 MG PO TABS: 40.0000 mg | ORAL_TABLET | Freq: Every day | ORAL | Status: DC

## 2015-12-24 MED ORDER — HYDROCHLOROTHIAZIDE 25 MG PO TABS: 25.0000 mg | ORAL_TABLET | Freq: Every day | ORAL | Status: DC

## 2015-12-24 NOTE — Patient Instructions (Signed)
Thanks for coming in today.   Your blood pressure is very high. We need to get it down.   Restart your blood pressure medicine TODAY. Take your medicine at home.   I have sent in refills.   If you have any sudden vision changes, nausea, headache that won't go away, or new numbness / tingling or weakness. Then you need to seek care either here or at the ED because this could a stroke related to your blood pressure.   It is very important that you work on treatment to reduce the amount of alcohol that you are drinking.   Schedule a follow up appointment with me in 1 week.   Thanks for letting us take care of you.   Sincerely, [name], MD Family Medicine -PGY 2

## 2015-12-24 NOTE — Progress Notes (Signed)
Patient ID: Lisa [name], female   DOB: 11-14-1955, 60 y.o.   MRN: [id number]   [facility] Aquilla Hacker, MD Phone: [phone]  Subjective:   # Check Up  - has continued to be treated for Hepatitis C by Infectious Disease since she was last seen here.  - Has also been followed by GI for Reflux, and Iron Deficiency Anemia. Underwent Upper Endoscopy with mild gastritis.  - Treated for H.Pylori - Has been having some symptoms including eye itching / burning.   # Skin itching / skin crawling. Has had this for about a year - No jaundice that she has noticed.  - No new medications.  - Has never had this before.  - Sister has had cutaneous lupus.  - Has not had any acid reflux symptoms.  - Has had some changes in her detergents.  - Has been very stressed.   # Very Stressed Continues To Drink ETOH - Drinking 4, 40oz beers a day.  - Has never tried to stop.  - Has been drinking this much for about a year.  - Used to drink hard liquor more so than she does now.  - Has not sought treatment here, went to meetings when in prison many years ago.  - She is ambivalent about treatment but becoming increasingly aware that this might be a good option for her.  - She is interested in talking with the social worker today.  - Has never had withdrawal symptoms or seizures.   # HTN  - No vision changes, or headaches today.  - mild abdominal discomfort.  - No nausea right now.  - Gets intermittent numbness of her left arm when laying on it but no new numbness or tingling at this time.  - Says she is taking her blood pressure medicine, she says she may skip twice during the week.  - Says she did not take her medicine today.     All relevant systems were reviewed and were negative unless otherwise noted in the HPI  Past Medical History Reviewed problem list.  Medications- reviewed and updated Current Outpatient Prescriptions  Medication Sig Dispense Refill  . aspirin  81 MG tablet Take 81 mg by mouth daily.    . ferrous sulfate 325 (65 FE) MG tablet Take 1 tablet (325 mg total) by mouth daily with breakfast. 90 tablet 3  . lisinopril-hydrochlorothiazide (PRINZIDE,ZESTORETIC) 20-12.5 MG per tablet Take 2 tablets by mouth daily. (Patient not taking: Reported on 09/15/2015) 180 tablet 1   No current facility-administered medications for this visit.   Chief complaint-noted No additions to family history Social history- patient is a 1ppd smoker  Objective: BP 212/82 mmHg  Pulse 94  Temp(Src) 98.6 F (37 C) (Oral)  Ht 5\' 4"  (1.626 m)  Wt 122 lb 8 oz (55.566 kg)  BMI 21.02 kg/m2 Gen: NAD, alert, cooperative with exam HEENT: NCAT, EOMI, PERRL, TMs nml Neck: FROM, supple CV: RRR, good S1/S2, no murmur Resp: CTABL, no wheezes, non-labored Abd: SNTND, BS present, no guarding or organomegaly Ext: No edema, warm, normal tone, moves UE/LE spontaneously Neuro: Alert and oriented, No gross deficits Skin: no rashes no lesions  Assessment/Plan:  # Itching / skin Crawling - very nonspecific, but need to do basic workup. Unclear if this is allergic, or Urticaria, or Biliary dysfunction, could also be ETOH.  - CMET - CBC - I would try to switch to hypoallergenic detergent.  - Atarax for itching.   # ETOH Abuse -  this is her primary health issue.  - Social work to talk with her today.  - Given options for outpatient and inpatient treatment.  - will have her come back in two weeks to follow up.  - CMET today.   # HTN - severely elevated. Asymptomatic. This is being driven by primary htn as well as ETOH abuse.  - She did not take her medicine this am.  - Recheck before leaving.  - Restart lisinopril.  - check CMET, CBC today.  - Return in 1 week.

## 2015-12-25 LAB — COMPLETE METABOLIC PANEL WITH GFR
ALBUMIN: 3.5 g/dL — AB (ref 3.6–5.1)
ALK PHOS: 76 U/L (ref 33–130)
ALT: 24 U/L (ref 6–29)
AST: 65 U/L — AB (ref 10–35)
BUN: 7 mg/dL (ref 7–25)
CALCIUM: 9.5 mg/dL (ref 8.6–10.4)
CHLORIDE: 106 mmol/L (ref 98–110)
CO2: 24 mmol/L (ref 20–31)
Creat: 0.53 mg/dL (ref 0.50–0.99)
Glucose, Bld: 69 mg/dL (ref 65–99)
POTASSIUM: 4.6 mmol/L (ref 3.5–5.3)
Sodium: 139 mmol/L (ref 135–146)
Total Bilirubin: 0.9 mg/dL (ref 0.2–1.2)
Total Protein: 9 g/dL — ABNORMAL HIGH (ref 6.1–8.1)

## 2015-12-30 LAB — VITAMIN B1

## 2016-01-01 ENCOUNTER — Telehealth: Payer: Self-pay | Admitting: Family Medicine

## 2016-01-01 ENCOUNTER — Ambulatory Visit: Payer: No Typology Code available for payment source | Admitting: Family Medicine

## 2016-01-01 MED ORDER — VITAMIN B-1 250 MG PO TABS: 250.0000 mg | ORAL_TABLET | Freq: Every day | ORAL | Status: DC

## 2016-01-01 MED ORDER — CYANOCOBALAMIN 250 MCG PO TABS: 250.0000 ug | ORAL_TABLET | Freq: Every day | ORAL | Status: AC

## 2016-01-01 NOTE — Telephone Encounter (Signed)
Called to inform of low B1. She had an appointment today but did not show. She didn't answer her phone. Left VM that her B1 is very low, and I am concerned about this in the setting of alcoholism. Need to also supplement folate and B12. Will send in Rx to pharmacy.   CGM MD

## 2016-02-04 ENCOUNTER — Other Ambulatory Visit: Payer: Self-pay

## 2016-02-04 ENCOUNTER — Ambulatory Visit (INDEPENDENT_AMBULATORY_CARE_PROVIDER_SITE_OTHER): Payer: No Typology Code available for payment source | Admitting: Family Medicine

## 2016-02-04 ENCOUNTER — Encounter: Payer: Self-pay | Admitting: Family Medicine

## 2016-02-04 VITALS — BP 178/78 | HR 84 | Temp 98.4°F | Ht 64.0 in | Wt 122.0 lb

## 2016-02-04 DIAGNOSIS — Z1231 Encounter for screening mammogram for malignant neoplasm of breast: Secondary | ICD-10-CM

## 2016-02-04 DIAGNOSIS — I10 Essential (primary) hypertension: Secondary | ICD-10-CM

## 2016-02-04 MED ORDER — AMLODIPINE BESYLATE 10 MG PO TABS: 10.0000 mg | ORAL_TABLET | Freq: Every day | ORAL | Status: DC

## 2016-02-04 NOTE — Assessment & Plan Note (Signed)
She continues to have elevated BP's though she is now taking HCTZ. She is not taking Lisinopril. Stop the lisinopril, add Norvasc 10mg  daily which she has been on before. Add BB if needs additional control which I suspect she will. HTN is multifactorial with ETOH abuse being a BIG factor in this. She recognizes this. Follow up in 3 weeks.

## 2016-02-04 NOTE — Progress Notes (Signed)
Patient ID: Lisa [name], female   DOB: 1956-06-06, 60 y.o.   MRN: [id number]   [facility] Aquilla Hacker, MD Phone: [phone]  Subjective:   # HTN Follow Up  - BP previously > [id number] systolic.  - Today is 0000000 - Somewhat better, still not at goal. Goal is < 150/90.  - On two drugs.  - HCTZ 25 mg, Lisinopril 40mg , though has not been taking lisinopril because she feels that it makes her go to the bathroom too often.  - She does not go to the bathroom too much with the HCTZ alone.  - She has been seen by cardiology in the past with mgmt of HTN. 01/2015 was supposed to be on Amlodipine 10mg  with HCTZ / ACEi, Were considering BB at that time.  - No chest pain, no discomfort.  - She does get short of breath with brief periods of exertion. No chest pain with exertion.  - She does not get LE edema, No orthopnea, no PND.  - She continues to smoke 1/2 ppd.  - She is still drinking about 2, 40oz beers a day.   All relevant systems were reviewed and were negative unless otherwise noted in the HPI  Past Medical History Reviewed problem list.  Medications- reviewed and updated Current Outpatient Prescriptions  Medication Sig Dispense Refill  . aspirin 81 MG tablet Take 81 mg by mouth daily.    . ferrous sulfate 325 (65 FE) MG tablet Take 1 tablet (325 mg total) by mouth daily with breakfast. 90 tablet 3  . hydrochlorothiazide (HYDRODIURIL) 25 MG tablet Take 1 tablet (25 mg total) by mouth daily. 90 tablet 3  . hydrOXYzine (ATARAX/VISTARIL) 10 MG tablet Take 1 tablet (10 mg total) by mouth 3 (three) times daily as needed. 30 tablet 0  . lisinopril (PRINIVIL,ZESTRIL) 40 MG tablet Take 1 tablet (40 mg total) by mouth daily. 90 tablet 3  . lisinopril-hydrochlorothiazide (PRINZIDE,ZESTORETIC) 20-12.5 MG per tablet Take 2 tablets by mouth daily. (Patient not taking: Reported on 09/15/2015) 180 tablet 1  . Thiamine HCl (VITAMIN B-1) 250 MG tablet Take 1 tablet (250 mg total)  by mouth daily. 30 tablet 4  . vitamin B-12 (CYANOCOBALAMIN) 250 MCG tablet Take 1 tablet (250 mcg total) by mouth daily. 30 tablet 4   No current facility-administered medications for this visit.   Chief complaint-noted No additions to family history Social history- patient is a current 1/2ppd smoker  Objective: BP 178/78 mmHg  Pulse 84  Temp(Src) 98.4 F (36.9 C) (Oral)  Ht 5\' 4"  (1.626 m)  Wt 122 lb (55.339 kg)  BMI 20.93 kg/m2 Gen: NAD, alert, cooperative with exam HEENT: NCAT, EOMI, PERRL Neck: FROM, supple CV: RRR, good S1/S2, no murmur Resp: CTABL, no wheezes, non-labored Abd: SNTND, BS present, no guarding or organomegaly Ext: No edema, warm, normal tone, moves UE/LE spontaneously Neuro: Alert and oriented, No gross deficits Skin: no rashes no lesions  Assessment/Plan:  # HTN  - Continue the HCTZ - Stop lisinopril as she is not compliant with this.  - Restart Amlodipine at 10mg  - Check BP at home.  - Add BB if not controlled on the two above agents.  - Discussed ETOH cessation.  - last BMET on 3/30.   # SOB - no LE edema. Smoker. 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 NM study one year ago without evidence of significant CAD.  - PFT's - Consider repeat echo in the near future.   # ETOH Abuse -  discussed again. She is drinking because of stress.  - Discussed need to quit drinking.  - Continue to work at this.

## 2016-02-04 NOTE — Patient Instructions (Signed)
Thanks for coming in today.   Will start Amlodipine in addition to the HCTZ to bring your blood pressure down.   Stop the lisinopril.   Continue to work on quitting drinking, and work on quitting smoking.   Make an appointment on your way out to get PFT's done with Dr. Valentina Lucks.   Make a follow up appointment with me in 3 weeks.   Check your blood pressure daily.   Thanks for letting us take care of you.   Sincerely, [name], MD Family Medicine -PGY 2

## 2016-02-11 ENCOUNTER — Other Ambulatory Visit: Payer: Self-pay | Admitting: Internal Medicine

## 2016-02-11 ENCOUNTER — Ambulatory Visit (INDEPENDENT_AMBULATORY_CARE_PROVIDER_SITE_OTHER): Payer: No Typology Code available for payment source | Admitting: Pharmacist

## 2016-02-11 ENCOUNTER — Ambulatory Visit
Admission: RE | Admit: 2016-02-11 | Discharge: 2016-02-11 | Disposition: A | Discharge status: Home / Self Care | Payer: No Typology Code available for payment source | Source: Ambulatory Visit | Attending: Internal Medicine | Admitting: Internal Medicine

## 2016-02-11 ENCOUNTER — Encounter: Payer: Self-pay | Admitting: Pharmacist

## 2016-02-11 VITALS — BP 218/81 | HR 78 | Ht 63.25 in | Wt 122.4 lb

## 2016-02-11 DIAGNOSIS — R058 Other specified cough: Secondary | ICD-10-CM

## 2016-02-11 DIAGNOSIS — I10 Essential (primary) hypertension: Secondary | ICD-10-CM

## 2016-02-11 DIAGNOSIS — K7400 Hepatic fibrosis, unspecified: Secondary | ICD-10-CM

## 2016-02-11 DIAGNOSIS — Z720 Tobacco use: Secondary | ICD-10-CM

## 2016-02-11 DIAGNOSIS — R05 Cough: Secondary | ICD-10-CM

## 2016-02-11 MED ORDER — MOMETASONE FURO-FORMOTEROL FUM 200-5 MCG/ACT IN AERO: 1.0000 | INHALATION_SPRAY | Freq: Two times a day (BID) | RESPIRATORY_TRACT | Status: DC

## 2016-02-11 NOTE — Progress Notes (Signed)
Patient ID: Lisa [name], female   DOB: [birth year], 60 y.o.   MRN: [id number] Reviewed: Agree with Dr. Graylin Shiver documentation and management.

## 2016-02-11 NOTE — Assessment & Plan Note (Signed)
Hypertension:  Hypertension longstanding currently uncontrolled.  Patient denies adherence with medication.  Patient reports taking hydrochlorothiazide, but did not pick up amlodipine from the pharmacy following initiation on 02/04/16.  Blood pressure in clinic was 218/81.  Administered clonidine 0.1 mg tablet during visit (LOT #: [id number], EXP. [id number]).  Repeat BP 199/78.  Control is suboptimal due to nonadherence with medications.  Instructed patient to pick up amlodipine from pharmacy (confirmed zero copay with pharmacy) and to start taking amlodipine 10 mg daily in addition to hydrochlorothiazide 25 mg daily.

## 2016-02-11 NOTE — Patient Instructions (Signed)
Thank you for coming in today! We really enjoyed meeting you.  Start taking Dulera inhaler 1 puff twice a day.  Pick up amlodipine and thiamine from your pharmacy and start taking once a day.  Follow-up with Dr. Valentina Lucks in 1 month.

## 2016-02-11 NOTE — Progress Notes (Signed)
Patient ID: Lisa [name], female   DOB: Apr 27, 1956, 60 y.o.   MRN: [id number]   CSW consult from Dr. Valentina Lucks to provide patient with a bus pass after scheduled appointment with him.  CSW met with patient to discuss transportation issues.  Shared information about [facility] to use for her ongoing appointments.  Patient was not aware of service.   CSW provided patient with 2 bus pass to go to pharmacy to pick up her medication and ride home. Also provided patient with information and phone number to call DSS for transportation needs. Patient was appreciative of bus passes and transportation information.  Casimer Lanius. [place] [employer],  [phone] 11:42 AM

## 2016-02-11 NOTE — Assessment & Plan Note (Signed)
Patient with history of chronic cough and history of smoking.  Spirometry evaluation reveals Moderate restrictive lung disease. Post nebulized albuterol tx revealed mild restrictive lung disease.  Noted approximately 10% reversal in restriction following nebulizer treatment.  Patient has not been taking any medication, begin Dulera 200/5 one puff twice a day for the treatment plan at this time.  Educated patient on purpose, proper use, potential adverse effects including  risk of esophageal candidiasis and need to rinse mouth after each use.  Reviewed results of pulmonary function tests.  Pt verbalized understanding of results and education.    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  Will readdress at next visit.

## 2016-02-11 NOTE — Assessment & Plan Note (Signed)
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  Will readdress at next visit.

## 2016-02-11 NOTE — Progress Notes (Signed)
S:    Patient arrives in good spirits ambulating well.    Presents for lung function evaluation.   Patient was referred on 02/04/16.  Patient was last seen by Primary Care Provider on 02/04/16.   Patient reports breathing has been okay and is only difficult if she has a busy day shopping or walking with friends. She walks slow because her "whole body feels achy" in her calves and thighs which occurs when she walks to her mailbox.  Age when started using tobacco on a daily basis 70. Number of Cigarettes per day 12. Brand smoked [place]. Estimated Nicotine Content per Cigarette (mg) 1.2.  Estimated Nicotine intake per day 13.2mg .   Denies waking to smoke. If patient does wake in the night, then she will smoke a cigarette before going back to bed. She reports that the most she has ever smoked in day being 1.5 packs.  Rates IMPORTANCE of quitting tobacco on 1-10 scale of 8. Rates CONFIDENCE of quitting tobacco on 1-10 scale of 3.  Motivation to quit: Patient is not motivated to quit at this time.  O: mMRC score= 1 CAT score= 18 See Documentation Flowsheet - CAT/COPD for complete symptom scoring.  See "scanned report" or Documentation Flowsheet (discrete results - PFTs) for  Spirometry results. Patient provided good effort while attempting spirometry.   Lung Age = 89 years Albuterol Neb  Lot# [id number]     Exp. 12/18 Can easily palpate pulses bilaterally. Both feet are warm to the touch.  BP 191/77, 215/81,  218/81 after clonidine administered: 199/78  A/P: Spirometry evaluation reveals Moderate restrictive lung disease. Post nebulized albuterol tx revealed mild restrictive lung disease.  Noted approximately 10% reversal in restriction following nebulizer treatment.  Patient has not been taking any medication, begin Dulera 200/5 one puff twice a day for the treatment plan at this time.  Educated patient on purpose, proper use, potential adverse effects including  risk of esophageal candidiasis  and need to rinse mouth after each use.  Reviewed results of pulmonary function tests.  Pt verbalized understanding of results and education.    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  Will readdress at next visit.    Hypertension:  Hypertension longstanding currently uncontrolled.  Patient denies adherence with medication.  Patient reports taking hydrochlorothiazide, but did not pick up amlodipine from the pharmacy following initiation on 02/04/16.  Blood pressure in clinic was 218/81.  Administered clonidine 0.1 mg tablet during visit (LOT #: [id number], EXP. [id number]).  Repeat BP 199/78.  Control is suboptimal due to nonadherence with medications.  Instructed patient to pick up amlodipine from pharmacy (confirmed zero copay with pharmacy) and to start taking amlodipine 10 mg daily in addition to hydrochlorothiazide 25 mg daily.    Written pt instructions provided.  F/U Clinic visit in 1 month.   Total time in face to face counseling 45 minutes.  Patient seen with Zettie Cooley, PharmD Candidate, Cruz Condon, PharmD Resident and Elisabeth Most, PharmD Resident.

## 2016-02-12 ENCOUNTER — Telehealth: Payer: Self-pay | Admitting: *Deleted

## 2016-02-12 DIAGNOSIS — I10 Essential (primary) hypertension: Secondary | ICD-10-CM

## 2016-02-12 NOTE — Telephone Encounter (Signed)
-----   Message from Thayer Headings, MD sent at 02/11/2016  6:11 PM EDT ----- She needs an MRI of her liver to follow up the ultrasound.  Doesn't look concerning but recommends MRI to confirm it is nothing. Order placed.  thanks

## 2016-02-12 NOTE — Telephone Encounter (Signed)
Request Ms Joya Gaskins return call.  "[facility]" card referral for MRI per Dr Linus Salmons for recent ultrasound findings.

## 2016-02-15 NOTE — Addendum Note (Signed)
Addended by: Lorne Skeens D on: 02/15/2016 01:49 PM   Modules accepted: Orders

## 2016-02-15 NOTE — Telephone Encounter (Signed)
Received verbal order from Dr. Linus Salmons for CMET to be drawn prior to MRI on 02/23/16.

## 2016-02-15 NOTE — Telephone Encounter (Signed)
Stormy Fabian returned call.  No MRI appointments remaining for May, send to [facility] 02/25/16.  RN will process [facility] referral to be faxed 02/25/16.

## 2016-02-15 NOTE — Telephone Encounter (Signed)
Pt has "100%" financial coverage through [facility] until 03/09/16.  MRI appointment made for Tuesday, Feb 23, 2016 at 3 PM, pt to arrive at [facility] Registration at 2:45 PM.  Pt needs to be NPO starting at 12 noon.

## 2016-02-15 NOTE — Telephone Encounter (Signed)
Patient given MRI appt information, NPO after 11 AM.

## 2016-02-16 ENCOUNTER — Ambulatory Visit (INDEPENDENT_AMBULATORY_CARE_PROVIDER_SITE_OTHER): Payer: No Typology Code available for payment source | Admitting: Interventional Cardiology

## 2016-02-16 ENCOUNTER — Encounter: Payer: Self-pay | Admitting: Interventional Cardiology

## 2016-02-16 VITALS — BP 150/80 | HR 72 | Ht 63.25 in | Wt 122.0 lb

## 2016-02-16 DIAGNOSIS — I340 Nonrheumatic mitral (valve) insufficiency: Secondary | ICD-10-CM

## 2016-02-16 DIAGNOSIS — Z720 Tobacco use: Secondary | ICD-10-CM

## 2016-02-16 DIAGNOSIS — I10 Essential (primary) hypertension: Secondary | ICD-10-CM

## 2016-02-16 NOTE — Progress Notes (Signed)
Patient ID: Lisa [name], female   DOB: [birth year], 60 y.o.   MRN: [id number]     Cardiology Office Note   Date:  02/16/2016   ID:  Lisa [name], DOB 16-May-1956, MRN [id number]  PCP:  [name], MD    No chief complaint on file. f/u HTN   Wt Readings from Last 3 Encounters:  02/16/16 122 lb (55.339 kg)  02/11/16 122 lb 6.4 oz (55.52 kg)  02/04/16 122 lb (55.339 kg)       History of Present Illness: Lisa [name] is a 60 y.o. female  who has had difficult to control HTN. She had a stress test in the past that apparently was normal. She was scheduled to get an echo a few weeks ago but did not have transportation.   She has had HTN requiring meds for 5- years. More recently, BP has been better. First lisinopril / HCTZ was increased. THen Amlodipne was prescribed but was not initially started by the patient. She eventually started it and now gets BPs in the 140s at home. No chest pain. She is depressed. SHe was treated for Hep C and has cirrhosis.   She has family stress. She has stress from bills As well. She does not sleep well at night. She does add salt to food regularly, still, despite being instructed to stop.   Diffuse ache in abdomen. No exertional chest pain.  Rare discomfort in the chest which is short lived and relieved spontaneously.   She does not walk regularly. She does have to walk to the bus stop. SHe has foot pain and other joint pains, particularly the knees, that limit her.   She still smokes and drinks. She has cut back on both. She has not quit since last year.    She apparently stopped her amlodipine. Her blood pressure was in the 123456 systolic. About a week ago, she saw her primary care physician who put her back on amlodipine. Her blood pressure has been better since that time.      Past Medical History  Diagnosis Date  . Osteopetrosis   . Hepatitis C   . Tobacco use   . Alcohol abuse, daily use   . HTN (hypertension)   .  Poor dental hygiene   . Mitral valve prolapse   . Hx of cardiovascular stress test     Lexiscan Myoview 3/16: No ischemia, EF 66%, normal study  . Leg pain     ABIs 0000000:  normal   . Helicobacter pylori gastritis     Past Surgical History  Procedure Laterality Date  . Abdominal hysterectomy    . Laparoscopy    . Rod in right arm    . Carpal tunnel release       Current Outpatient Prescriptions  Medication Sig Dispense Refill  . amLODipine (NORVASC) 10 MG tablet Take 1 tablet (10 mg total) by mouth daily. 90 tablet 3  . aspirin 81 MG tablet Take 81 mg by mouth daily.    . ferrous sulfate 325 (65 FE) MG tablet Take 1 tablet (325 mg total) by mouth daily with breakfast. 90 tablet 3  . hydrochlorothiazide (HYDRODIURIL) 25 MG tablet Take 1 tablet (25 mg total) by mouth daily. 90 tablet 3  . hydrOXYzine (ATARAX/VISTARIL) 10 MG tablet Take 10 mg by mouth 3 (three) times daily as needed for itching.    . mometasone-formoterol (DULERA) 200-5 MCG/ACT AERO Inhale 1 puff into the lungs 2 (two) times daily. 1 Inhaler 0  .  Thiamine HCl (VITAMIN B-1) 250 MG tablet Take 1 tablet (250 mg total) by mouth daily. 30 tablet 4  . vitamin B-12 (CYANOCOBALAMIN) 250 MCG tablet Take 1 tablet (250 mcg total) by mouth daily. 30 tablet 4   No current facility-administered medications for this visit.    Allergies:   Celecoxib and Sulfonamide derivatives    Social History:  The patient  reports that she has been smoking Cigarettes.  She started smoking about 46 years ago. She has a 23 pack-year smoking history. She has never used smokeless tobacco. She reports that she drinks alcohol. She reports that she uses illicit drugs (Marijuana and Cocaine).   Family History:  The patient's family history includes Cancer in her father; Heart disease in her mother and sister; Lupus in her sister; Stroke in her sister. There is no history of Heart attack, Colon cancer, or Colon polyps.    ROS:  Please see the history  of present illness.   Otherwise, review of systems are positive for intermittent abdominal pain.   All other systems are reviewed and negative.    PHYSICAL EXAM: VS:  BP 150/80 mmHg  Pulse 72  Ht 5' 3.25" (1.607 m)  Wt 122 lb (55.339 kg)  BMI 21.43 kg/m2 , BMI Body mass index is 21.43 kg/(m^2). GEN: Well nourished, well developed, in no acute distress HEENT: normal Neck: no JVD, carotid bruits, or masses Cardiac: RRR; no murmurs, rubs, or gallops,no edema  Respiratory:  clear to auscultation bilaterally, normal work of breathing GI: soft, nontender, nondistended, + BS MS: no deformity or atrophy Skin: warm and dry, no rash Neuro:  Strength and sensation are intact Psych: euthymic mood, full affect   EKG:   The ekg ordered today demonstrates normal sinus rhythm, no ST segment changes   Recent Labs: 03/05/2015: TSH 0.862 12/24/2015: ALT 24; BUN 7; Creat 0.53; Hemoglobin 12.3; Platelets 320; Potassium 4.6; Sodium 139   Lipid Panel    Component Value Date/Time   CHOL 152 04/24/2014 0953   TRIG 88 04/24/2014 0953   HDL 46 04/24/2014 0953   CHOLHDL 3.3 04/24/2014 0953   VLDL 18 04/24/2014 0953   LDLCALC 88 04/24/2014 0953   LDLDIRECT 94 01/24/2013 1501     Other studies Reviewed: Additional studies/ records that were reviewed today with results demonstrating: normal stress test in March 2016.   ASSESSMENT AND PLAN:  1. HTN: Now on Amlodipine 10 mg daily.    Blood pressure better. Continue to follow while outside the doctor's office. She needs to decrease salt intake and decrease alcohol intake. This should help with blood pressure.  If blood pressure remains high, could consider adding an ACE inhibitor. She will follow-up with her primary care physician. 2. Tobacco abuse: She needs to stop smoking. This was stressed to the patient. 3. Abdominal pain: Known liver disease. She has follow-up with her regular doctor soon.   4. Mild mitral regurgitation:  No signs of CHF on exam.  KNown LVH.   Current medicines are reviewed at length with the patient today.  The patient concerns regarding her medicines were addressed.  The following changes have been made:  No change  Labs/ tests ordered today include:  Orders Placed This Encounter  Procedures  . EKG 12-Lead    Recommend 150 minutes/week of aerobic exercise Low fat, low carb, high fiber diet recommended  Disposition:   FU in 1 year   Signed, Larae Grooms, MD  02/16/2016 3:08 PM    [facility] [address], [place], [place]  [id number] Phone: [phone]; Fax: [phone]

## 2016-02-16 NOTE — Patient Instructions (Signed)
**Note De-identified  Obfuscation** Medication Instructions:  Same-no changes  Labwork: None  Testing/Procedures: None  Follow-Up: Your physician wants you to follow-up in: 1 year. You will receive a reminder letter in the mail two months in advance. If you don't receive a letter, please call our office to schedule the follow-up appointment.      If you need a refill on your cardiac medications before your next appointment, please call your pharmacy.   

## 2016-02-17 ENCOUNTER — Ambulatory Visit
Admission: RE | Admit: 2016-02-17 | Discharge: 2016-02-17 | Disposition: A | Discharge status: Home / Self Care | Payer: No Typology Code available for payment source | Source: Ambulatory Visit

## 2016-02-17 DIAGNOSIS — Z1231 Encounter for screening mammogram for malignant neoplasm of breast: Secondary | ICD-10-CM

## 2016-02-23 ENCOUNTER — Ambulatory Visit (HOSPITAL_COMMUNITY)
Admission: RE | Admit: 2016-02-23 | Discharge: 2016-02-23 | Disposition: A | Discharge status: Home / Self Care | Payer: No Typology Code available for payment source | Source: Ambulatory Visit | Attending: Internal Medicine | Admitting: Internal Medicine

## 2016-02-23 DIAGNOSIS — J90 Pleural effusion, not elsewhere classified: Secondary | ICD-10-CM | POA: Insufficient documentation

## 2016-02-23 DIAGNOSIS — K769 Liver disease, unspecified: Secondary | ICD-10-CM | POA: Insufficient documentation

## 2016-02-23 DIAGNOSIS — I517 Cardiomegaly: Secondary | ICD-10-CM | POA: Insufficient documentation

## 2016-02-23 DIAGNOSIS — K7460 Unspecified cirrhosis of liver: Secondary | ICD-10-CM | POA: Insufficient documentation

## 2016-02-23 DIAGNOSIS — K7400 Hepatic fibrosis, unspecified: Secondary | ICD-10-CM

## 2016-02-23 DIAGNOSIS — Z9081 Acquired absence of spleen: Secondary | ICD-10-CM | POA: Insufficient documentation

## 2016-02-23 LAB — CREATININE, SERUM
Creatinine, Ser: 0.75 mg/dL (ref 0.44–1.00)
GFR calc Af Amer: >60 mL/min (ref >60)
GFR calc non Af Amer: >60 mL/min (ref >60)

## 2016-02-23 MED ORDER — GADOBENATE DIMEGLUMINE 529 MG/ML IV SOLN
12.0000 mL | Freq: Once | INTRAVENOUS | Status: AC | PRN
  Administered 2016-02-23: 12 mL via INTRAVENOUS

## 2016-02-29 ENCOUNTER — Ambulatory Visit: Payer: No Typology Code available for payment source | Admitting: Family Medicine

## 2016-03-10 ENCOUNTER — Ambulatory Visit: Payer: No Typology Code available for payment source | Admitting: Pharmacist

## 2016-03-15 ENCOUNTER — Encounter: Payer: Self-pay | Admitting: Internal Medicine

## 2016-03-15 ENCOUNTER — Ambulatory Visit (INDEPENDENT_AMBULATORY_CARE_PROVIDER_SITE_OTHER): Payer: No Typology Code available for payment source | Admitting: Internal Medicine

## 2016-03-15 VITALS — BP 195/91 | HR 80 | Temp 97.7°F | Ht 63.25 in | Wt 119.8 lb

## 2016-03-15 DIAGNOSIS — K7030 Alcoholic cirrhosis of liver without ascites: Secondary | ICD-10-CM

## 2016-03-15 DIAGNOSIS — F1010 Alcohol abuse, uncomplicated: Secondary | ICD-10-CM

## 2016-03-15 DIAGNOSIS — Z720 Tobacco use: Secondary | ICD-10-CM

## 2016-03-15 DIAGNOSIS — B182 Chronic viral hepatitis C: Secondary | ICD-10-CM

## 2016-03-15 NOTE — Progress Notes (Signed)
   Subjective:    Patient ID: Lisa [name], female    DOB: 1955/12/06, 60 y.o.   MRN: [id number]  HPI  Here for follow up of hepatitis C and cirrhosis.        Completed Zepatier last Spring and completed 12 weeks through [facility]. Her viral load after finishing was negative.  Had some fatigue but no headache and did well through treatment.   Still drinking. 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  Sees Dr. Fuller Plan of GI.  Still smoking but getting counseling via pharmacy at her PCPs office.  Continues to drink but does understand the importance of quitting.  Ultrasound done and some growth and MRI with some undifferentiated mass, recommending repeat MRI in 3-6 months.    Review of Systems  Constitutional: Negative for fatigue.  Gastrointestinal: Negative for nausea and diarrhea.  Skin: Negative for rash.  Neurological: Negative for dizziness, light-headedness and headaches.  Psychiatric/Behavioral: Negative for confusion.       Objective:   Physical Exam  Constitutional: She appears well-developed and well-nourished. No distress.  Eyes: No scleral icterus.  Cardiovascular: Normal rate, regular rhythm and normal heart sounds.   No murmur heard. Pulmonary/Chest: Effort normal and breath sounds normal. No respiratory distress.  Skin: No rash noted.   Social History   Social History  . Marital Status: Divorced    Spouse Name: N/A  . Number of Children: 1  . Years of Education: N/A   Occupational History  . unemployed    Social History Main Topics  . Smoking status: Current Every Day Smoker -- 0.50 packs/day for 46 years    Types: Cigarettes    Start date: 09/26/1969  . Smokeless tobacco: Never Used     Comment: Max packs per day: 1.5 packs  . Alcohol Use: 0.0 oz/week    0 Standard drinks or equivalent per week     Comment: Drinks daily, two 40 oz beer daily  . Drug Use: Yes    Special: Marijuana, Cocaine     Comment: Past history of IV drug use  .  Sexual Activity: Not Currently   Other Topics Concern  . Not on file   Social History Narrative   Lives alone in [place] in an apartment. 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 No current source of income. Goes to [employer] for help as needed.   One daughter, age 31. Six grandchildren, one great-grand child.           Assessment & Plan:

## 2016-03-15 NOTE — Assessment & Plan Note (Signed)
MRI is equivocal and will repeat it in 3 months per radiology recommendations.

## 2016-03-15 NOTE — Assessment & Plan Note (Signed)
i discussed the importance of cessation.

## 2016-03-15 NOTE — Assessment & Plan Note (Signed)
Now considered cured.  

## 2016-03-15 NOTE — Assessment & Plan Note (Signed)
Counseled her on quitting and she was seen by our counselor and received resource information.

## 2016-03-17 ENCOUNTER — Ambulatory Visit: Payer: No Typology Code available for payment source | Admitting: Pharmacist

## 2016-03-17 ENCOUNTER — Ambulatory Visit: Payer: No Typology Code available for payment source | Admitting: Internal Medicine

## 2016-03-21 ENCOUNTER — Ambulatory Visit: Payer: No Typology Code available for payment source | Admitting: Student

## 2016-03-30 ENCOUNTER — Emergency Department (HOSPITAL_COMMUNITY): Payer: Self-pay

## 2016-03-30 ENCOUNTER — Emergency Department (HOSPITAL_COMMUNITY)
Admission: EM | Admit: 2016-03-30 | Discharge: 2016-03-30 | Disposition: A | Discharge status: Home / Self Care | Payer: Self-pay | Attending: Emergency Medicine | Admitting: Emergency Medicine

## 2016-03-30 ENCOUNTER — Encounter (HOSPITAL_COMMUNITY): Payer: Self-pay

## 2016-03-30 DIAGNOSIS — Z79899 Other long term (current) drug therapy: Secondary | ICD-10-CM | POA: Insufficient documentation

## 2016-03-30 DIAGNOSIS — R0789 Other chest pain: Secondary | ICD-10-CM | POA: Insufficient documentation

## 2016-03-30 DIAGNOSIS — Z7982 Long term (current) use of aspirin: Secondary | ICD-10-CM | POA: Insufficient documentation

## 2016-03-30 DIAGNOSIS — F17210 Nicotine dependence, cigarettes, uncomplicated: Secondary | ICD-10-CM | POA: Insufficient documentation

## 2016-03-30 DIAGNOSIS — I10 Essential (primary) hypertension: Secondary | ICD-10-CM | POA: Insufficient documentation

## 2016-03-30 DIAGNOSIS — L03116 Cellulitis of left lower limb: Secondary | ICD-10-CM | POA: Insufficient documentation

## 2016-03-30 DIAGNOSIS — R079 Chest pain, unspecified: Secondary | ICD-10-CM

## 2016-03-30 LAB — CBC WITH DIFFERENTIAL/PLATELET
BASOS ABS: 0 10*3/uL (ref 0.0–0.1)
Basophils Relative: 0 %
EOS PCT: 1 %
Eosinophils Absolute: 0.1 10*3/uL (ref 0.0–0.7)
HEMATOCRIT: 37.4 % (ref 36.0–46.0)
Hemoglobin: 12.7 g/dL (ref 12.0–15.0)
Lymphocytes Relative: 65 %
Lymphs Abs: 3.6 10*3/uL (ref 0.7–4.0)
MCH: 30.8 pg (ref 26.0–34.0)
MCHC: 34 g/dL (ref 30.0–36.0)
MCV: 90.6 fL (ref 78.0–100.0)
MONO ABS: 0.8 10*3/uL (ref 0.1–1.0)
MONOS PCT: 14 %
NEUTROS ABS: 1.1 10*3/uL — AB (ref 1.7–7.7)
Neutrophils Relative %: 20 %
PLATELETS: 322 10*3/uL (ref 150–400)
RBC: 4.13 MIL/uL (ref 3.87–5.11)
RDW: 18.1 % — AB (ref 11.5–15.5)
WBC: 5.6 10*3/uL (ref 4.0–10.5)

## 2016-03-30 LAB — RAPID URINE DRUG SCREEN, HOSP PERFORMED
Amphetamines: NOT DETECTED
Barbiturates: NOT DETECTED
Benzodiazepines: NOT DETECTED
Cocaine: POSITIVE — AB
OPIATES: NOT DETECTED
TETRAHYDROCANNABINOL: NOT DETECTED

## 2016-03-30 LAB — COMPREHENSIVE METABOLIC PANEL
ALK PHOS: 89 U/L (ref 38–126)
ALT: 29 U/L (ref 14–54)
AST: 87 U/L — AB (ref 15–41)
Albumin: 3.2 g/dL — ABNORMAL LOW (ref 3.5–5.0)
Anion gap: 7 (ref 5–15)
BILIRUBIN TOTAL: 0.5 mg/dL (ref 0.3–1.2)
CALCIUM: 8.5 mg/dL — AB (ref 8.9–10.3)
CO2: 25 mmol/L (ref 22–32)
CREATININE: 0.55 mg/dL (ref 0.44–1.00)
Chloride: 106 mmol/L (ref 101–111)
Glucose, Bld: 77 mg/dL (ref 65–99)
Potassium: 3.7 mmol/L (ref 3.5–5.1)
Sodium: 138 mmol/L (ref 135–145)
TOTAL PROTEIN: 8.7 g/dL — AB (ref 6.5–8.1)

## 2016-03-30 LAB — I-STAT TROPONIN, ED
Troponin i, poc: 0.01 ng/mL (ref 0.00–0.08)
Troponin i, poc: 0.01 ng/mL (ref 0.00–0.08)

## 2016-03-30 LAB — ETHANOL: ALCOHOL ETHYL (B): 139 mg/dL — AB (ref <5)

## 2016-03-30 MED ORDER — CLINDAMYCIN HCL 150 MG PO CAPS: 300.0000 mg | ORAL_CAPSULE | Freq: Three times a day (TID) | ORAL | Status: DC

## 2016-03-30 MED ORDER — LIDOCAINE HCL (PF) 1 % IJ SOLN
INTRAMUSCULAR | Status: AC
  Filled 2016-03-30: qty 30

## 2016-03-30 MED ORDER — TETANUS-DIPHTH-ACELL PERTUSSIS 5-2.5-18.5 LF-MCG/0.5 IM SUSP
0.5000 mL | Freq: Once | INTRAMUSCULAR | Status: AC
  Administered 2016-03-30: 0.5 mL via INTRAMUSCULAR
  Filled 2016-03-30: qty 0.5

## 2016-03-30 MED ORDER — LIDOCAINE HCL (CARDIAC) 20 MG/ML IV SOLN
INTRAVENOUS | Status: AC
  Filled 2016-03-30: qty 5

## 2016-03-30 NOTE — ED Notes (Signed)
Patient here with 1 week of left foot pain and swelling. Reports that she fell on same and now has would to lateral aspect of foot, redness noted to same.

## 2016-03-30 NOTE — ED Notes (Signed)
Family at bedside. 

## 2016-03-30 NOTE — ED Notes (Signed)
Pt ambulated to restroom with no assistance. Pt stated she felt steady

## 2016-03-30 NOTE — ED Notes (Signed)
Pt smells like alcohol, and stated she has been drinking since 1400

## 2016-03-30 NOTE — ED Notes (Signed)
Called to room where pt states she has 5 minutesa of chest tightness.

## 2016-03-30 NOTE — ED Notes (Signed)
Pt ambulated self efficiently to restroom with minimal help complained of Chancellor pain.
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
## 2016-03-30 NOTE — Discharge Instructions (Signed)
Take the prescribed medication as directed. Follow-up with Dr. Scarlette Calico regarding your chest pain. Follow-up with your primary care doctor regarding cellulitis of left foot. Return to the ED for new or worsening symptoms.  Cellulitis Cellulitis is an infection of the skin and the tissue beneath it. The infected area is usually red and tender. Cellulitis occurs most often in the arms and lower legs.  CAUSES  Cellulitis is caused by bacteria that enter the skin through cracks or cuts in the skin. The most common types of bacteria that cause cellulitis are staphylococci and streptococci. SIGNS AND SYMPTOMS   Redness and warmth.  Swelling.  Tenderness or pain.  Fever. DIAGNOSIS  Your health care provider can usually determine what is wrong based on a physical exam. Blood tests may also be done. TREATMENT  Treatment usually involves taking an antibiotic medicine. HOME CARE INSTRUCTIONS   Take your antibiotic medicine as directed by your health care provider. Finish the antibiotic even if you start to feel better.  Keep the infected arm or leg elevated to reduce swelling.  Apply a warm cloth to the affected area up to 4 times per day to relieve pain.  Take medicines only as directed by your health care provider.  Keep all follow-up visits as directed by your health care provider. SEEK MEDICAL CARE IF:   You notice red streaks coming from the infected area.  Your red area gets larger or turns dark in color.  Your bone or joint underneath the infected area becomes painful after the skin has healed.  Your infection returns in the same area or another area.  You notice a swollen bump in the infected area.  You develop new symptoms.  You have a fever. SEEK IMMEDIATE MEDICAL CARE IF:   You feel very sleepy.  You develop vomiting or diarrhea.  You have a general ill feeling (malaise) with muscle aches and pains.   This information is not intended to replace advice given to  you by your health care provider. Make sure you discuss any questions you have with your health care provider.   Document Released: 06/22/2005 Document Revised: 06/03/2015 Document Reviewed: 11/28/2011 Elsevier Interactive Patient Education [employer].

## 2016-03-30 NOTE — ED Notes (Signed)
Pt arrived to ED room A4 from xray

## 2016-05-03 ENCOUNTER — Ambulatory Visit: Payer: No Typology Code available for payment source

## 2016-05-25 ENCOUNTER — Telehealth: Payer: Self-pay | Admitting: *Deleted

## 2016-05-25 NOTE — Telephone Encounter (Signed)
Patient called to check and see if her MRI had been scheduled. She has a follow up appt 06/16/16 with Dr Linus Salmons and wants to make sure it is done prior to that visit. Advised her will check into it and someone will give her a call back soon.

## 2016-06-01 NOTE — Telephone Encounter (Signed)
Faxed MRI request to [facility] to approve and schedule.

## 2016-06-02 ENCOUNTER — Ambulatory Visit (INDEPENDENT_AMBULATORY_CARE_PROVIDER_SITE_OTHER): Payer: Self-pay

## 2016-06-02 ENCOUNTER — Telehealth: Payer: Self-pay | Admitting: *Deleted

## 2016-06-02 ENCOUNTER — Ambulatory Visit (HOSPITAL_COMMUNITY)
Admission: EM | Admit: 2016-06-02 | Discharge: 2016-06-02 | Disposition: A | Discharge status: Home / Self Care | Payer: No Typology Code available for payment source | Attending: Internal Medicine | Admitting: Internal Medicine

## 2016-06-02 ENCOUNTER — Encounter (HOSPITAL_COMMUNITY): Payer: Self-pay | Admitting: Emergency Medicine

## 2016-06-02 DIAGNOSIS — I10 Essential (primary) hypertension: Secondary | ICD-10-CM

## 2016-06-02 DIAGNOSIS — Z9119 Patient's noncompliance with other medical treatment and regimen: Secondary | ICD-10-CM

## 2016-06-02 DIAGNOSIS — Z9114 Patient's other noncompliance with medication regimen: Secondary | ICD-10-CM

## 2016-06-02 DIAGNOSIS — H1013 Acute atopic conjunctivitis, bilateral: Secondary | ICD-10-CM

## 2016-06-02 MED ORDER — NAPHAZOLINE-PHENIRAMINE 0.025-0.3 % OP SOLN: 1.0000 [drp] | OPHTHALMIC | 1 refills | Status: AC | PRN

## 2016-06-02 MED ORDER — CLONIDINE HCL 0.1 MG PO TABS
ORAL_TABLET | ORAL | Status: AC
  Filled 2016-06-02: qty 1

## 2016-06-02 MED ORDER — CLONIDINE HCL 0.1 MG PO TABS
0.1000 mg | ORAL_TABLET | Freq: Once | ORAL | Status: AC
  Administered 2016-06-02: 0.1 mg via ORAL

## 2016-06-02 MED ORDER — LISINOPRIL 20 MG PO TABS: 20.0000 mg | ORAL_TABLET | Freq: Every day | ORAL | 0 refills | Status: DC

## 2016-06-02 NOTE — Telephone Encounter (Signed)
If it is after her scheduled appt, this can be rescheduled until she has had the MRI. thanks
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
## 2016-06-02 NOTE — Telephone Encounter (Signed)
Lisa [name] from the Urgent care called about patient. She is currently at the [facility] for elevated BP.  She is currently on Norvasc 10 and HCTZ 25 mg, he is wondering if there is another meds to add on. She had been on Lisinopril 20 mg in the past, no documentation of why she stopped it, no hx of allergic reaction. It seems she self d/c it. I advised this might be something to consider restarting after discussing it with the patient. I also advised to confirm if she took her Norvasc and HCTZ today. Consider treating her HTNs urgency at the UC and recheck BP few mins later.   Patient to come to our nurse clinic tomorrow for BP follow up. If symptomatic today, to send to the ED.

## 2016-06-02 NOTE — Telephone Encounter (Signed)
Received call from Urgent care regarding patient's blood pressure.  Request was to speak with an attending.  Will forward to Dr. Gwendlyn Deutscher.  Derl Barrow, RN

## 2016-06-02 NOTE — ED Triage Notes (Signed)
Pt reports swelling below bilateral eyes onset 2 week   Denies inj/trauma  Also c/o chest tightness and cough onset yest   Denies SOB, dyspnea, HA  A&O x4... NAD

## 2016-06-03 ENCOUNTER — Ambulatory Visit (INDEPENDENT_AMBULATORY_CARE_PROVIDER_SITE_OTHER): Payer: No Typology Code available for payment source | Admitting: *Deleted

## 2016-06-03 VITALS — BP 180/80 | HR 78

## 2016-06-03 DIAGNOSIS — Z136 Encounter for screening for cardiovascular disorders: Secondary | ICD-10-CM

## 2016-06-03 DIAGNOSIS — Z0130 Encounter for examination of blood pressure without abnormal findings: Secondary | ICD-10-CM

## 2016-06-03 DIAGNOSIS — I10 Essential (primary) hypertension: Secondary | ICD-10-CM

## 2016-06-03 MED ORDER — AMLODIPINE BESYLATE 10 MG PO TABS: 10.0000 mg | ORAL_TABLET | Freq: Every day | ORAL | 0 refills | Status: DC

## 2016-06-03 MED ORDER — HYDROCHLOROTHIAZIDE 25 MG PO TABS: 25.0000 mg | ORAL_TABLET | Freq: Every day | ORAL | 0 refills | Status: DC

## 2016-06-03 NOTE — Progress Notes (Signed)
   Patient in nurse clinic for follow up of blood pressure done at urgent care.  Patient denies chest pain, headache, SOB or dizziness.  Patient is having some bilateral eye redness and puffiness. Patient was given prescription for eyes from urgent care.  Patient reported taking her Lisinopril 20 mg today, but does not have refills for amlodipine and HCTZ.  Verbal order given by Dr. Gwendlyn Deutscher to fill meds for 30 days and schedule an appointment with PCP within 1 week.  Meds sent to [facility] and appointment 06/10/16 at 2 PM with PCP. Patient advised to call EMS or go to urgent care if she develop chest pain, SOB, dizziness, severe headache or visual changes over the weekend.  Patient stated understanding.   Today's Vitals   06/03/16 1439 06/03/16 1447  BP: (!) 182/98 (!) 180/80  Pulse: 80 78  SpO2: 98% 97%    Derl Barrow, RN

## 2016-06-03 NOTE — Progress Notes (Signed)
Patient seen with Lisa [name]. Her BP was slightly elevated but she only used her Lisinopril and not the Norvasc or HCTZ. Refill given. F/U with PCP in 1 week or sooner if symptomatic. Currently stable.

## 2016-06-10 ENCOUNTER — Ambulatory Visit (INDEPENDENT_AMBULATORY_CARE_PROVIDER_SITE_OTHER): Payer: No Typology Code available for payment source | Admitting: Student

## 2016-06-10 ENCOUNTER — Encounter: Payer: Self-pay | Admitting: Student

## 2016-06-10 VITALS — BP 139/60 | HR 81 | Temp 98.3°F | Wt 117.0 lb

## 2016-06-10 DIAGNOSIS — I10 Essential (primary) hypertension: Secondary | ICD-10-CM

## 2016-06-10 DIAGNOSIS — R160 Hepatomegaly, not elsewhere classified: Secondary | ICD-10-CM

## 2016-06-10 DIAGNOSIS — F1010 Alcohol abuse, uncomplicated: Secondary | ICD-10-CM

## 2016-06-10 MED ORDER — TRAMADOL HCL 50 MG PO TABS: 50.0000 mg | ORAL_TABLET | Freq: Two times a day (BID) | ORAL | 0 refills | Status: DC | PRN

## 2016-06-10 MED ORDER — DOCUSATE SODIUM 100 MG PO CAPS: 100.0000 mg | ORAL_CAPSULE | Freq: Two times a day (BID) | ORAL | 0 refills | Status: DC

## 2016-06-10 MED ORDER — VITAMIN B-1 250 MG PO TABS: 250.0000 mg | ORAL_TABLET | Freq: Every day | ORAL | 4 refills | Status: AC

## 2016-06-10 NOTE — Progress Notes (Signed)
   Subjective:    Patient ID: Lisa [name], female    DOB: 1956/02/13, 60 y.o.   MRN: [id number]   CC: blood pressure follow up  HPI: 60 y/o F with h/o alcohol abuse, HTN, cirrhosis, presents for follow up of HTN  HTN - went to urgent care on 9/8 for bilateral eye drainage and was found to be hypertensive so she was started on HCTZ and norvasc - she denies headache, changes in vision, chest pain , SOB  Etoh abuse - typically drinks 2 40oz drinks per day, but had one 12 oz beer yesterday - denies agitation, anxiety at this time - has been given etoh cessation material in past but has not yet followed up, feels she would like to stop drinking - denies ever going through etoh withdrawal in past  Hep C Cirrhosis - completed treatment with ID of hep C with clearance - had liver masson MRI on 5/30 with reccomendation for repeat MRI in 3-6 mo- this is being followed by Dr Linus Salmons with ID  Smoking status reviewed Smokes 6 cigs per day  Review of Systems  Per HPI, else denies recent illness, fever, headache, changes in vision, chest pain, shortness of breath, abdominal pain, N/V/D, weakness    Objective:  BP 139/60   Pulse 81   Temp 98.3 F (36.8 C) (Oral)   Wt 117 lb (53.1 kg)   SpO2 98%   BMI 20.73 kg/m  Vitals and nursing note reviewed  General: NAD Cardiac: RRR 2/6 systolic murmur Respiratory: CTAB, normal effort Extremities: no edema or cyanosis. WWP. MSK: no asterixis Skin: warm and dry, no rashes noted Neuro: alert and oriented   Assessment & Plan:    HYPERTENSION, BENIGN Blood pressure with addition of lisinopril to HCTZ and norvasc.  - pateint left before a BMP could be performed - will have patient return for lab visit - BMP ordered  Alcohol abuse Counseled on ETOH cessation - patient to present to outpatient treatment program - she expressed her understanding and willingness to do this - will follow  Liver mass Hepatic mass noted on MRI on 5/30  with rec to repeat in 3-6 months - this is being followed by ID - will follow along    Cartel Mauss A. Lincoln Brigham MD, [facility] Resident PGY-3 Pager [phone]

## 2016-06-10 NOTE — Patient Instructions (Signed)
Follow up in 3 months with PCP Please refer to the information of alcohol cessation that were given to you. Please consider starting a program to help you quit smoking If you have questions or concerns, call the office at [phone]

## 2016-06-10 NOTE — Assessment & Plan Note (Addendum)
Hepatic mass noted on MRI on 5/30 with rec to repeat in 3-6 months - this is being followed by ID - will follow along

## 2016-06-10 NOTE — Assessment & Plan Note (Addendum)
Blood pressure with addition of lisinopril to HCTZ and norvasc.  - pateint left before a BMP could be performed - will have patient return for lab visit - BMP ordered

## 2016-06-10 NOTE — Assessment & Plan Note (Signed)
Counseled on ETOH cessation - patient to present to outpatient treatment program - she expressed her understanding and willingness to do this - will follow

## 2016-06-13 ENCOUNTER — Telehealth: Payer: Self-pay | Admitting: Lab

## 2016-06-13 NOTE — Telephone Encounter (Signed)
Erline Levine from [facility] states that she is unable to schedule the patient's MRI because her card expired 06/09/2016.  She was unable to get the patient an appmt before the card expired

## 2016-06-13 NOTE — Telephone Encounter (Addendum)
Called patient and she stated that her orange card is active from 05/17/16 through 11/17/16. Called STacy at [facility] for [facility] and let her know this. Asked that she return my call and let me know if a new referral is needed. Myrtis Hopping

## 2016-06-16 ENCOUNTER — Encounter: Payer: Self-pay | Admitting: Internal Medicine

## 2016-06-16 ENCOUNTER — Ambulatory Visit (INDEPENDENT_AMBULATORY_CARE_PROVIDER_SITE_OTHER): Payer: No Typology Code available for payment source | Admitting: Internal Medicine

## 2016-06-16 DIAGNOSIS — B182 Chronic viral hepatitis C: Secondary | ICD-10-CM

## 2016-06-16 DIAGNOSIS — K7460 Unspecified cirrhosis of liver: Secondary | ICD-10-CM

## 2016-06-16 DIAGNOSIS — R160 Hepatomegaly, not elsewhere classified: Secondary | ICD-10-CM

## 2016-06-16 NOTE — Assessment & Plan Note (Signed)
Now considered cured.  

## 2016-06-16 NOTE — Progress Notes (Signed)
   Subjective:    Patient ID: Cory Munch, female    DOB: July 15, 1956, 60 y.o.   MRN: [id number]  HPI  Here for follow up of hepatitis C and cirrhosis.        Completed Zepatier last Spring and completed 12 weeks through [place]. Her viral load after finishing was negative.  Had some fatigue but no headache and did well through treatment.   Still drinking. 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  Sees Dr. Fuller Plan of GI.  Still smoking but getting counseling via pharmacy at her PCPs office.  Continues to drink but does understand the importance of quitting.  Ultrasound done and some growth and MRI with some undifferentiated mass, recommending repeat MRI in 3-6 months.  Has not been able to get the MRI repeated yet.  Review of Systems  Constitutional: Negative for fatigue.  Gastrointestinal: Negative for diarrhea and nausea.  Skin: Negative for rash.  Neurological: Negative for dizziness, light-headedness and headaches.  Psychiatric/Behavioral: Negative for confusion.       Objective:   Physical Exam  Constitutional: She appears well-developed and well-nourished. No distress.  Eyes: No scleral icterus.  Cardiovascular: Normal rate, regular rhythm and normal heart sounds.   No murmur heard. Pulmonary/Chest: Effort normal and breath sounds normal. No respiratory distress.  Skin: No rash noted.   Social History   Social History  . Marital status: Divorced    Spouse name: N/A  . Number of children: 1  . Years of education: N/A   Occupational History  . unemployed [place] History Main Topics  . Smoking status: Current Every Day Smoker    Packs/day: 0.25    Years: 46.00    Types: Cigarettes    Start date: 09/26/1969  . Smokeless tobacco: Never Used     Comment: Max packs per day: 1.5 packs  . Alcohol use 25.2 oz/week    42 Standard drinks or equivalent per week     Comment: Drinks daily, two 40 oz beer daily  . Drug use:     Types:  Marijuana, Cocaine     Comment: Still using marijuana.  Past history of IV drug use  . Sexual activity: Not Currently   Other Topics Concern  . Not on file   Social History Narrative   Lives alone in [place] in an apartment. 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 No current source of income. Goes to [employer] for help as needed.   One daughter, age 28. Six grandchildren, one great-grand child.           Assessment & Plan:

## 2016-06-16 NOTE — Assessment & Plan Note (Signed)
Have not yet been able to get it repeated.  Will try again. rtc after MRI

## 2016-06-16 NOTE — Assessment & Plan Note (Signed)
Needs HCC screening which the MRI will be adequate

## 2016-06-21 ENCOUNTER — Ambulatory Visit (INDEPENDENT_AMBULATORY_CARE_PROVIDER_SITE_OTHER): Payer: No Typology Code available for payment source | Admitting: Interventional Cardiology

## 2016-06-21 ENCOUNTER — Encounter: Payer: Self-pay | Admitting: Interventional Cardiology

## 2016-06-21 VITALS — BP 140/70 | HR 92 | Ht 63.25 in | Wt 115.0 lb

## 2016-06-21 DIAGNOSIS — Z720 Tobacco use: Secondary | ICD-10-CM

## 2016-06-21 DIAGNOSIS — I10 Essential (primary) hypertension: Secondary | ICD-10-CM

## 2016-06-21 DIAGNOSIS — I340 Nonrheumatic mitral (valve) insufficiency: Secondary | ICD-10-CM

## 2016-06-21 NOTE — Progress Notes (Signed)
Patient ID: Lisa [name], female   DOB: [birth year], 60 y.o.   MRN: [id number]     Cardiology Office Note   Date:  06/21/2016   ID:  Dwayna Keene, DOB Dec 26, 1955, MRN [id number]  PCP:  Marina Goodell, MD    No chief complaint on file. f/u HTN   Wt Readings from Last 3 Encounters:  06/21/16 115 lb (52.2 kg)  06/16/16 118 lb (53.5 kg)  06/10/16 117 lb (53.1 kg)       History of Present Illness: Lisa [name] is a 60 y.o. female  who has had difficult to control HTN in th epast. She had a stress test in the past that apparently was normal.    She has had HTN requiring meds for 5- years. More recently, BP has been better. First lisinopril / HCTZ was increased. THen Amlodipne was prescribed but was not initially started by the patient. She eventually started it and now gets BPs in the 140s at home. No chest pain. She is depressed. SHe was treated for Hep C and has cirrhosis.   She has family stress. She has stress from bills As well. She does not sleep well at night. She has decreased adding salt to food regularly, still, despite being instructed to stop.   Diffuse ache in abdomen has persisted.  Some nausea. No exertional chest pain.  Rare discomfort in the chest which is short lived and relieved spontaneously.   She does not walk regularly. She does have to walk to the bus stop. SHe has foot pain and other joint pains, particularly the knees, that limit her. She feels achy the day after walking so she does not want to walk more.  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   She still smokes and drinks. She has cut back on both. She has not quit since last year.       Past Medical History:  Diagnosis Date  . Alcohol abuse, daily use   . Helicobacter pylori gastritis   . Hepatitis C   . HTN (hypertension)   . Hx of cardiovascular stress test    Lexiscan Myoview 3/16: No ischemia, EF 66%, normal study  . Leg pain    ABIs 3/16:  normal   . Mitral valve  prolapse   . Osteopetrosis   . Poor dental hygiene   . Tobacco use     Past Surgical History:  Procedure Laterality Date  . ABDOMINAL HYSTERECTOMY    . CARPAL TUNNEL RELEASE    . LAPAROSCOPY    . rod in right arm       Current Outpatient Prescriptions  Medication Sig Dispense Refill  . amLODipine (NORVASC) 10 MG tablet Take 1 tablet (10 mg total) by mouth daily. 30 tablet 0  . aspirin 81 MG tablet Take 81 mg by mouth daily.    . ferrous sulfate 325 (65 FE) MG tablet Take 1 tablet (325 mg total) by mouth daily with breakfast. 90 tablet 3  . hydrochlorothiazide (HYDRODIURIL) 25 MG tablet Take 1 tablet (25 mg total) by mouth daily. 30 tablet 0  . lisinopril (PRINIVIL,ZESTRIL) 20 MG tablet Take 1 tablet (20 mg total) by mouth daily. 30 tablet 0  . mometasone-formoterol (DULERA) 200-5 MCG/ACT AERO Inhale 1 puff into the lungs 2 (two) times daily. 1 Inhaler 0  . naphazoline-pheniramine (NAPHCON-A) 0.025-0.3 % ophthalmic solution Place 1 drop into both eyes every 4 (four) hours as needed for irritation. 15 mL 1  . Thiamine HCl (VITAMIN  B-1) 250 MG tablet Take 1 tablet (250 mg total) by mouth daily. 30 tablet 4  . traMADol (ULTRAM) 50 MG tablet Take 1 tablet (50 mg total) by mouth every 12 (twelve) hours as needed for severe pain. 30 tablet 0  . vitamin B-12 (CYANOCOBALAMIN) 250 MCG tablet Take 1 tablet (250 mcg total) by mouth daily. 30 tablet 4   No current facility-administered medications for this visit.     Allergies:   Celecoxib and Sulfonamide derivatives    Social History:  The patient  reports that she has been smoking Cigarettes.  She started smoking about 46 years ago. She has a 23.00 pack-year smoking history. She has never used smokeless tobacco. She reports that she drinks about 25.2 oz of alcohol per week . She reports that she uses drugs, including Marijuana and Cocaine.   Family History:  The patient's family history includes Cancer in her father; Heart disease in her  mother and sister; Lupus in her sister; Stroke in her sister.    ROS:  Please see the history of present illness.   Otherwise, review of systems are positive for intermittent abdominal pain.   All other systems are reviewed and negative.    PHYSICAL EXAM: VS:  BP 140/70   Pulse 92   Ht 5' 3.25" (1.607 m)   Wt 115 lb (52.2 kg)   SpO2 98%   BMI 20.21 kg/m  , BMI Body mass index is 20.21 kg/m. GEN: Well nourished, well developed, in no acute distress  HEENT: normal  Neck: no JVD, carotid bruits, or masses Cardiac: RRR; no murmurs, rubs, or gallops,no edema  Respiratory:  clear to auscultation bilaterally, normal work of breathing GI: soft, nontender, nondistended, + BS MS: no deformity or atrophy  Skin: warm and dry, no rash Neuro:  Strength and sensation are intact Psych: euthymic mood, full affect   EKG:   The ekg ordered today demonstrates normal sinus rhythm, no ST segment changes   Recent Labs: 03/30/2016: ALT 29; BUN <5; Creatinine, Ser 0.55; Hemoglobin 12.7; Platelets 322; Potassium 3.7; Sodium 138   Lipid Panel    Component Value Date/Time   CHOL 152 04/24/2014 0953   TRIG 88 04/24/2014 0953   HDL 46 04/24/2014 0953   CHOLHDL 3.3 04/24/2014 0953   VLDL 18 04/24/2014 0953   LDLCALC 88 04/24/2014 0953   LDLDIRECT 94 01/24/2013 1501     Other studies Reviewed: Additional studies/ records that were reviewed today with results demonstrating: normal stress test in March 2016.   ASSESSMENT AND PLAN:  1. HTN: Now on Amlodipine 10 mg daily.    Blood pressure better. Continue to follow while outside the doctor's office. She needs to decrease salt intake and decrease alcohol intake. This should help with blood pressure. She will follow-up with her primary care physician.  She will f/u here if she has any problems with BP.  2. Tobacco abuse: She needs to stop smoking. This was stressed to the patient. 3. Abdominal pain: Known liver disease. She has follow-up with her  regular doctor soon.   4. Mild mitral regurgitation:  No signs of CHF on exam. [place] LVH.   Current medicines are reviewed at length with the patient today.  The patient concerns regarding her medicines were addressed.  The following changes have been made:  No change  Labs/ tests ordered today include:  No orders of the defined types were placed in this encounter.   Recommend 150 minutes/week of aerobic exercise Low fat,  low carb, high fiber diet recommended  Disposition:   FU in 1 year   Signed, Larae Grooms, MD  06/21/2016 10:50 AM    [facility] [place], [place],   [address] Phone: [phone]; Fax: [phone]

## 2016-06-21 NOTE — Patient Instructions (Signed)
Medication Instructions:  Same-no changes  Labwork: None  Testing/Procedures: None  Follow-Up: As needed     If you need a refill on your cardiac medications before your next appointment, please call your pharmacy.   

## 2016-07-11 NOTE — Telephone Encounter (Signed)
Patient called to check status of her Liver scan. Advised her being worked on and will have the referral coordinator call her as soon as it gets scheduled.

## 2016-07-12 NOTE — Telephone Encounter (Signed)
The MRI is being scheduled when available by [facility] (orange card) and the patient is aware of this. Stacy, referral coordinator is working on this and as you know it just depends on availability. If the patient would rather be self pay, I would be happy to schedule the MRI today. It is very important that she have it, as Dr. Linus Salmons is attempting to rule out any possible cancer. Myrtis Hopping

## 2016-07-21 ENCOUNTER — Encounter: Payer: Self-pay | Admitting: Student

## 2016-07-25 ENCOUNTER — Other Ambulatory Visit: Payer: Self-pay | Admitting: Student

## 2016-07-25 DIAGNOSIS — I10 Essential (primary) hypertension: Secondary | ICD-10-CM

## 2016-07-25 NOTE — Telephone Encounter (Signed)
Pt needs refills on lisinopril, hydrochlorothiazide, and amlodipine. Pt would like to know if we have any coupons for any of these medications. Pt uses [facility] [address]. Please advise. Thanks! ep

## 2016-07-26 MED ORDER — LISINOPRIL 20 MG PO TABS: 20.0000 mg | ORAL_TABLET | Freq: Every day | ORAL | 2 refills | Status: DC

## 2016-07-26 MED ORDER — AMLODIPINE BESYLATE 10 MG PO TABS: 10.0000 mg | ORAL_TABLET | Freq: Every day | ORAL | 2 refills | Status: DC

## 2016-07-26 MED ORDER — HYDROCHLOROTHIAZIDE 25 MG PO TABS: 25.0000 mg | ORAL_TABLET | Freq: Every day | ORAL | 2 refills | Status: DC

## 2016-07-28 ENCOUNTER — Emergency Department (HOSPITAL_BASED_OUTPATIENT_CLINIC_OR_DEPARTMENT_OTHER)
Admit: 2016-07-28 | Discharge: 2016-07-28 | Disposition: A | Discharge status: Home / Self Care | Payer: Self-pay | Attending: Emergency Medicine | Admitting: Emergency Medicine

## 2016-07-28 ENCOUNTER — Emergency Department (HOSPITAL_COMMUNITY): Payer: Self-pay

## 2016-07-28 ENCOUNTER — Observation Stay (HOSPITAL_COMMUNITY)
Admission: EM | Admit: 2016-07-28 | Discharge: 2016-07-29 | Disposition: A | Discharge status: Home / Self Care | Payer: Self-pay | Attending: Family Medicine | Admitting: Family Medicine

## 2016-07-28 ENCOUNTER — Encounter (HOSPITAL_COMMUNITY): Payer: Self-pay | Admitting: Emergency Medicine

## 2016-07-28 DIAGNOSIS — R269 Unspecified abnormalities of gait and mobility: Secondary | ICD-10-CM

## 2016-07-28 DIAGNOSIS — Z8249 Family history of ischemic heart disease and other diseases of the circulatory system: Secondary | ICD-10-CM

## 2016-07-28 DIAGNOSIS — F17210 Nicotine dependence, cigarettes, uncomplicated: Secondary | ICD-10-CM | POA: Insufficient documentation

## 2016-07-28 DIAGNOSIS — R079 Chest pain, unspecified: Secondary | ICD-10-CM | POA: Diagnosis present

## 2016-07-28 DIAGNOSIS — I10 Essential (primary) hypertension: Secondary | ICD-10-CM | POA: Diagnosis present

## 2016-07-28 DIAGNOSIS — M79609 Pain in unspecified limb: Secondary | ICD-10-CM

## 2016-07-28 DIAGNOSIS — Z7982 Long term (current) use of aspirin: Secondary | ICD-10-CM | POA: Insufficient documentation

## 2016-07-28 DIAGNOSIS — M25511 Pain in right shoulder: Secondary | ICD-10-CM | POA: Insufficient documentation

## 2016-07-28 DIAGNOSIS — B182 Chronic viral hepatitis C: Secondary | ICD-10-CM | POA: Diagnosis present

## 2016-07-28 DIAGNOSIS — I700 Atherosclerosis of aorta: Secondary | ICD-10-CM | POA: Diagnosis present

## 2016-07-28 DIAGNOSIS — F1020 Alcohol dependence, uncomplicated: Secondary | ICD-10-CM

## 2016-07-28 DIAGNOSIS — F1010 Alcohol abuse, uncomplicated: Secondary | ICD-10-CM | POA: Diagnosis present

## 2016-07-28 DIAGNOSIS — Z823 Family history of stroke: Secondary | ICD-10-CM

## 2016-07-28 DIAGNOSIS — Z720 Tobacco use: Secondary | ICD-10-CM | POA: Diagnosis present

## 2016-07-28 DIAGNOSIS — Y902 Blood alcohol level of 40-59 mg/100 ml: Secondary | ICD-10-CM | POA: Insufficient documentation

## 2016-07-28 DIAGNOSIS — Z8042 Family history of malignant neoplasm of prostate: Secondary | ICD-10-CM

## 2016-07-28 DIAGNOSIS — K7460 Unspecified cirrhosis of liver: Secondary | ICD-10-CM | POA: Diagnosis present

## 2016-07-28 DIAGNOSIS — R160 Hepatomegaly, not elsewhere classified: Secondary | ICD-10-CM

## 2016-07-28 DIAGNOSIS — M7989 Other specified soft tissue disorders: Secondary | ICD-10-CM | POA: Insufficient documentation

## 2016-07-28 DIAGNOSIS — R0789 Other chest pain: Principal | ICD-10-CM | POA: Insufficient documentation

## 2016-07-28 DIAGNOSIS — D649 Anemia, unspecified: Secondary | ICD-10-CM | POA: Insufficient documentation

## 2016-07-28 DIAGNOSIS — F1910 Other psychoactive substance abuse, uncomplicated: Secondary | ICD-10-CM | POA: Diagnosis present

## 2016-07-28 DIAGNOSIS — Z888 Allergy status to other drugs, medicaments and biological substances status: Secondary | ICD-10-CM

## 2016-07-28 DIAGNOSIS — Z882 Allergy status to sulfonamides status: Secondary | ICD-10-CM

## 2016-07-28 DIAGNOSIS — K7030 Alcoholic cirrhosis of liver without ascites: Secondary | ICD-10-CM | POA: Insufficient documentation

## 2016-07-28 DIAGNOSIS — F1410 Cocaine abuse, uncomplicated: Secondary | ICD-10-CM | POA: Insufficient documentation

## 2016-07-28 DIAGNOSIS — M792 Neuralgia and neuritis, unspecified: Secondary | ICD-10-CM | POA: Diagnosis present

## 2016-07-28 DIAGNOSIS — Z79899 Other long term (current) drug therapy: Secondary | ICD-10-CM | POA: Insufficient documentation

## 2016-07-28 DIAGNOSIS — F1210 Cannabis abuse, uncomplicated: Secondary | ICD-10-CM | POA: Insufficient documentation

## 2016-07-28 DIAGNOSIS — F411 Generalized anxiety disorder: Secondary | ICD-10-CM | POA: Insufficient documentation

## 2016-07-28 DIAGNOSIS — Z8349 Family history of other endocrine, nutritional and metabolic diseases: Secondary | ICD-10-CM

## 2016-07-28 HISTORY — DX: Unspecified cirrhosis of liver: K74.60

## 2016-07-28 LAB — COMPREHENSIVE METABOLIC PANEL
ALT: 27 U/L (ref 14–54)
AST: 73 U/L — AB (ref 15–41)
Albumin: 2.9 g/dL — ABNORMAL LOW (ref 3.5–5.0)
Alkaline Phosphatase: 91 U/L (ref 38–126)
Anion gap: 7 (ref 5–15)
BILIRUBIN TOTAL: 1.3 mg/dL — AB (ref 0.3–1.2)
CALCIUM: 8.7 mg/dL — AB (ref 8.9–10.3)
CO2: 24 mmol/L (ref 22–32)
CREATININE: 0.59 mg/dL (ref 0.44–1.00)
Chloride: 108 mmol/L (ref 101–111)
Glucose, Bld: 78 mg/dL (ref 65–99)
Potassium: 3.7 mmol/L (ref 3.5–5.1)
Sodium: 139 mmol/L (ref 135–145)
TOTAL PROTEIN: 8 g/dL (ref 6.5–8.1)

## 2016-07-28 LAB — CBC WITH DIFFERENTIAL/PLATELET
BASOS ABS: 0 10*3/uL (ref 0.0–0.1)
Basophils Relative: 1 %
EOS ABS: 0 10*3/uL (ref 0.0–0.7)
Eosinophils Relative: 1 %
HCT: 35.9 % — ABNORMAL LOW (ref 36.0–46.0)
Hemoglobin: 12.3 g/dL (ref 12.0–15.0)
LYMPHS ABS: 2.2 10*3/uL (ref 0.7–4.0)
Lymphocytes Relative: 45 %
MCH: 31.9 pg (ref 26.0–34.0)
MCHC: 34.3 g/dL (ref 30.0–36.0)
MCV: 93 fL (ref 78.0–100.0)
MONO ABS: 1 10*3/uL (ref 0.1–1.0)
Monocytes Relative: 20 %
NEUTROS PCT: 33 %
Neutro Abs: 1.6 10*3/uL — ABNORMAL LOW (ref 1.7–7.7)
PLATELETS: 277 10*3/uL (ref 150–400)
RBC: 3.86 MIL/uL — AB (ref 3.87–5.11)
RDW: 19.2 % — AB (ref 11.5–15.5)
WBC: 4.8 10*3/uL (ref 4.0–10.5)

## 2016-07-28 LAB — URINALYSIS, ROUTINE W REFLEX MICROSCOPIC
BILIRUBIN URINE: NEGATIVE
Glucose, UA: NEGATIVE mg/dL
HGB URINE DIPSTICK: NEGATIVE
Ketones, ur: NEGATIVE mg/dL
Leukocytes, UA: NEGATIVE
NITRITE: NEGATIVE
PROTEIN: NEGATIVE mg/dL
Specific Gravity, Urine: 1.011 (ref 1.005–1.030)
pH: 5.5 (ref 5.0–8.0)

## 2016-07-28 LAB — I-STAT TROPONIN, ED
TROPONIN I, POC: 0 ng/mL (ref 0.00–0.08)
Troponin i, poc: 0.01 ng/mL (ref 0.00–0.08)

## 2016-07-28 LAB — BRAIN NATRIURETIC PEPTIDE: B Natriuretic Peptide: 106.4 pg/mL — ABNORMAL HIGH (ref 0.0–100.0)

## 2016-07-28 LAB — AMMONIA: AMMONIA: 52 umol/L — AB (ref 9–35)

## 2016-07-28 LAB — ETHANOL: ALCOHOL ETHYL (B): 50 mg/dL — AB (ref <5)

## 2016-07-28 MED ORDER — HYDROXYZINE HCL 10 MG PO TABS
10.0000 mg | ORAL_TABLET | Freq: Three times a day (TID) | ORAL | Status: DC | PRN
  Administered 2016-07-29: 10 mg via ORAL
  Filled 2016-07-28 (×3): qty 1

## 2016-07-28 MED ORDER — ADULT MULTIVITAMIN W/MINERALS CH
1.0000 | ORAL_TABLET | Freq: Every day | ORAL | Status: DC
  Filled 2016-07-28: qty 1

## 2016-07-28 MED ORDER — TRAMADOL HCL 50 MG PO TABS
50.0000 mg | ORAL_TABLET | Freq: Two times a day (BID) | ORAL | Status: DC | PRN
  Administered 2016-07-29: 50 mg via ORAL
  Filled 2016-07-28: qty 1

## 2016-07-28 MED ORDER — SODIUM CHLORIDE 0.9% FLUSH
3.0000 mL | Freq: Two times a day (BID) | INTRAVENOUS | Status: DC
  Administered 2016-07-29 (×2): 3 mL via INTRAVENOUS

## 2016-07-28 MED ORDER — ASPIRIN 81 MG PO CHEW
324.0000 mg | CHEWABLE_TABLET | Freq: Once | ORAL | Status: AC
  Administered 2016-07-28: 324 mg via ORAL

## 2016-07-28 MED ORDER — LISINOPRIL 10 MG PO TABS
20.0000 mg | ORAL_TABLET | Freq: Every day | ORAL | Status: DC
  Administered 2016-07-29: 20 mg via ORAL
  Filled 2016-07-28: qty 2

## 2016-07-28 MED ORDER — FOLIC ACID 1 MG PO TABS
1.0000 mg | ORAL_TABLET | Freq: Every day | ORAL | Status: DC
  Administered 2016-07-29: 1 mg via ORAL
  Filled 2016-07-28: qty 1

## 2016-07-28 MED ORDER — HYDROCHLOROTHIAZIDE 25 MG PO TABS: 25.0000 mg | ORAL_TABLET | Freq: Every day | ORAL | Status: DC

## 2016-07-28 MED ORDER — THIAMINE HCL 100 MG/ML IJ SOLN: 100.0000 mg | Freq: Every day | INTRAMUSCULAR | Status: DC

## 2016-07-28 MED ORDER — MOMETASONE FURO-FORMOTEROL FUM 200-5 MCG/ACT IN AERO
1.0000 | INHALATION_SPRAY | Freq: Two times a day (BID) | RESPIRATORY_TRACT | Status: DC
  Administered 2016-07-29: 1 via RESPIRATORY_TRACT
  Filled 2016-07-28: qty 8.8

## 2016-07-28 MED ORDER — ASPIRIN 81 MG PO CHEW
81.0000 mg | CHEWABLE_TABLET | Freq: Every day | ORAL | Status: DC
  Administered 2016-07-29: 81 mg via ORAL
  Filled 2016-07-28: qty 1

## 2016-07-28 MED ORDER — NAPHAZOLINE-PHENIRAMINE 0.025-0.3 % OP SOLN
1.0000 [drp] | OPHTHALMIC | Status: DC | PRN
Start: 2016-07-28 — End: 2016-07-29

## 2016-07-28 MED ORDER — LORAZEPAM 1 MG PO TABS: 1.0000 mg | ORAL_TABLET | Freq: Four times a day (QID) | ORAL | Status: DC | PRN

## 2016-07-28 MED ORDER — LORAZEPAM 1 MG PO TABS: 0.0000 mg | ORAL_TABLET | Freq: Two times a day (BID) | ORAL | Status: DC

## 2016-07-28 MED ORDER — LORAZEPAM 1 MG PO TABS
0.0000 mg | ORAL_TABLET | Freq: Four times a day (QID) | ORAL | Status: DC
  Administered 2016-07-29: 1 mg via ORAL
  Filled 2016-07-28: qty 1

## 2016-07-28 MED ORDER — FERROUS SULFATE 325 (65 FE) MG PO TABS
325.0000 mg | ORAL_TABLET | Freq: Every day | ORAL | Status: DC
  Administered 2016-07-29: 325 mg via ORAL
  Filled 2016-07-28: qty 1

## 2016-07-28 MED ORDER — LORAZEPAM 2 MG/ML IJ SOLN: 1.0000 mg | Freq: Four times a day (QID) | INTRAMUSCULAR | Status: DC | PRN

## 2016-07-28 MED ORDER — VITAMIN B-1 100 MG PO TABS
100.0000 mg | ORAL_TABLET | Freq: Every day | ORAL | Status: DC
  Administered 2016-07-29: 100 mg via ORAL
  Filled 2016-07-28: qty 1

## 2016-07-28 MED ORDER — AMLODIPINE BESYLATE 10 MG PO TABS: 10.0000 mg | ORAL_TABLET | Freq: Every day | ORAL | Status: DC

## 2016-07-28 MED ORDER — ENOXAPARIN SODIUM 40 MG/0.4ML ~~LOC~~ SOLN
40.0000 mg | Freq: Every day | SUBCUTANEOUS | Status: DC
Start: 2016-07-28 — End: 2016-07-29
  Administered 2016-07-29: 40 mg via SUBCUTANEOUS
  Filled 2016-07-28: qty 0.4

## 2016-07-28 MED ORDER — CYANOCOBALAMIN 500 MCG PO TABS
250.0000 ug | ORAL_TABLET | Freq: Every day | ORAL | Status: DC
  Administered 2016-07-29: 250 ug via ORAL
  Filled 2016-07-28: qty 1

## 2016-07-28 NOTE — Progress Notes (Addendum)
*  PRELIMINARY RESULTS* Vascular Ultrasound Left lower extremity venous duplex has been completed.  Preliminary findings: No evidence of DVT or baker's cyst.  Landry Mellow, RDMS, RVT  07/28/2016, 6:21 PM

## 2016-07-28 NOTE — ED Notes (Signed)
Pt states she has been "drinking more than she should" for the past few days. States last drink was yesterday and she drank 2 forty ounce beers. There is a faint smell of alcohol on her breath upon assessment.

## 2016-07-28 NOTE — ED Notes (Signed)
Attempted to call report

## 2016-07-28 NOTE — ED Provider Notes (Signed)
[facility] DEPT Provider Note   CSN: [id number] Arrival date & time: 07/28/16  1345     History   Chief Complaint Chief Complaint  Patient presents with  . Leg Swelling    HPI Lisa [name] is a 60 y.o. female with a past medical history significant for hepatitis C, alcohol abuse, cirrhosis of the liver, hypertension, and mitral valve prolapse presents for leg swelling, leg pain, chest pain, right shoulder pain and worsening fatigue. Patient reports that she has had chest pain on and off for the last month but it worsened over the last few days. She describes her chest pain as pressure-like, 8 out of 10 in severity, located in her central left chest, nonradiating, associated with some tingling in her left arm, associated with shortness of breath and nausea. Patient denies palpitations.   Patient says that she has had bilateral lower extremity pain for "sometime". She says that last night, her left leg was swollen from her ankle to her knee. She says that the swelling has improved today but due to the chest pain and worsening generalized fatigue, she wanted evaluation.  Denies any abdominal pain worsened from her baseline abdominal aching. She denies any vomiting. She denies any diarrhea but does report some constipation. Nuys any fevers but does report some subjective chills. She reports a chronic cough.  She is also reporting some pain in her right shoulder. She says that this started last week when she was on and "party bus" and was pole dancing. She said that she was using her right arm and spinning around the pole when her shoulder pain started. She reports it as mild to moderate discomfort. She denies any numbness or tingling in her right extremity.    The history is provided by the patient and medical records. No language interpreter was used.  Chest Pain   This is a recurrent problem. The current episode started yesterday. The problem occurs constantly. The problem has not  changed since onset.The pain is present in the substernal region. The pain is at a severity of 8/10. The pain is severe. The quality of the pain is described as pressure-like. The pain does not radiate. Exacerbated by: palpation. Associated symptoms include abdominal pain (chronic pain), cough, diaphoresis, leg pain, lower extremity edema (improved), malaise/fatigue, nausea, shortness of breath and sputum production (clear). Pertinent negatives include no back pain, no dizziness, no fever, no headaches, no hemoptysis, no irregular heartbeat, no near-syncope, no palpitations and no vomiting. She has tried nothing for the symptoms. The treatment provided no relief.  Her past medical history is significant for hypertension.    Past Medical History:  Diagnosis Date  . Alcohol abuse, daily use   . Cirrhosis of liver ([place])   . Helicobacter pylori gastritis   . Hepatitis C   . HTN (hypertension)   . Hx of cardiovascular stress test    Lexiscan Myoview 3/16: No ischemia, EF 66%, normal study  . Leg pain    ABIs 3/16:  normal   . Mitral valve prolapse   . Osteopetrosis   . Poor dental hygiene   . Tobacco use     Patient Active Problem List   Diagnosis Date Noted  . Liver mass 06/10/2016  . Cirrhosis of liver without ascites ([place]) 05/26/2015  . Aching pain 03/10/2015  . Mitral regurgitation 02/16/2015  . Anemia 12/29/2014  . Abdominal pain 12/23/2014  . Thigh pain 12/10/2014  . Productive cough 12/10/2014  . Allergic rhinitis 11/07/2014  . Tobacco abuse  07/24/2014  . Heart murmur 07/24/2014  . Neuropathic pain 07/07/2014  . Bleeding nose 06/18/2014  . Hypertensive urgency 05/21/2014  . Back pain 05/21/2014  . Chest pain 03/31/2014  . Alcohol abuse 08/19/2013  . Anxiety state, unspecified 08/19/2013  . Depression 01/24/2013  . Osteopetrosis   . Chronic hepatitis C without hepatic coma ([place])   . HYPERTENSION, BENIGN 03/18/2010    Past Surgical History:  Procedure Laterality Date    . ABDOMINAL HYSTERECTOMY    . CARPAL TUNNEL RELEASE    . LAPAROSCOPY    . rod in right arm      OB History    Gravida Para Term Preterm AB Living   1 1 1     1    SAB TAB Ectopic Multiple Live Births                   Home Medications    Prior to Admission medications   Medication Sig Start Date End Date Taking? Authorizing Provider  amLODipine (NORVASC) 10 MG tablet Take 1 tablet (10 mg total) by mouth daily. 07/26/16   Veatrice Bourbon, MD  aspirin 81 MG tablet Take 81 mg by mouth daily.    Historical Provider, MD  ferrous sulfate 325 (65 FE) MG tablet Take 1 tablet (325 mg total) by mouth daily with breakfast. 01/09/15   Leone Haven, MD  hydrochlorothiazide (HYDRODIURIL) 25 MG tablet Take 1 tablet (25 mg total) by mouth daily. 07/26/16   Alyssa A Haney, MD  lisinopril (PRINIVIL,ZESTRIL) 20 MG tablet Take 1 tablet (20 mg total) by mouth daily. 07/26/16   Alyssa A Haney, MD  mometasone-formoterol (DULERA) 200-5 MCG/ACT AERO Inhale 1 puff into the lungs 2 (two) times daily. 02/11/16   Zenia Resides, MD  naphazoline-pheniramine (NAPHCON-A) 0.025-0.3 % ophthalmic solution Place 1 drop into both eyes every 4 (four) hours as needed for irritation. 06/02/16   Konrad Felix, PA  Thiamine HCl (VITAMIN B-1) 250 MG tablet Take 1 tablet (250 mg total) by mouth daily. 06/10/16   Veatrice Bourbon, MD  traMADol (ULTRAM) 50 MG tablet Take 1 tablet (50 mg total) by mouth every 12 (twelve) hours as needed for severe pain. 06/10/16   Veatrice Bourbon, MD  vitamin B-12 (CYANOCOBALAMIN) 250 MCG tablet Take 1 tablet (250 mcg total) by mouth daily. 01/01/16   Aquilla Hacker, MD    Family History Family History  Problem Relation Age of Onset  . Cancer Father     Prostate  . Heart disease Mother   . Stroke Sister   . Heart disease Sister   . Lupus Sister   . Heart attack Neg Hx   . Colon cancer Neg Hx   . Colon polyps Neg Hx     Social History Social History  Substance Use Topics  . Smoking  status: Current Every Day Smoker    Packs/day: 0.50    Years: 46.00    Types: Cigarettes    Start date: 09/26/1969  . Smokeless tobacco: Never Used  . Alcohol use 25.2 oz/week    42 Standard drinks or equivalent per week     Comment: Drinks daily, two 40 oz beer daily     Allergies   Celecoxib and Sulfonamide derivatives   Review of Systems Review of Systems  Constitutional: Positive for chills, diaphoresis, fatigue and malaise/fatigue. Negative for fever.  HENT: Negative for congestion.   Eyes: Negative for visual disturbance.  Respiratory: Positive for cough, sputum production (  clear) and shortness of breath. Negative for hemoptysis, chest tightness, wheezing and stridor.   Cardiovascular: Positive for chest pain. Negative for palpitations and near-syncope.  Gastrointestinal: Positive for abdominal pain (chronic pain), constipation and nausea. Negative for diarrhea and vomiting.  Genitourinary: Negative for dysuria and flank pain.  Musculoskeletal: Negative for back pain.  Skin: Negative for rash and wound.  Neurological: Negative for dizziness, light-headedness and headaches.  Psychiatric/Behavioral: Negative for agitation.  All other systems reviewed and are negative.    Physical Exam Updated Vital Signs BP 179/85   Pulse 81   Temp 98.6 F (37 C) (Oral)   Resp 17   Ht 5\' 4"  (1.626 m)   Wt 112 lb (50.8 kg)   SpO2 100%   BMI 19.22 kg/m   Physical Exam  Constitutional: She is oriented to person, place, and time. She appears well-developed and well-nourished. No distress.  HENT:  Head: Normocephalic and atraumatic.  Mouth/Throat: Oropharynx is clear and moist. No oropharyngeal exudate.  Eyes: Conjunctivae and EOM are normal. Pupils are equal, round, and reactive to light.  Neck: Normal range of motion. Neck supple.  Cardiovascular: Normal rate, regular rhythm and intact distal pulses.   Murmur heard.   Pulmonary/Chest: Effort normal and breath sounds normal. No  stridor. No respiratory distress. She has no wheezes. She has no rales. She exhibits tenderness.  Abdominal: Soft. There is no tenderness.  Musculoskeletal: She exhibits no edema or tenderness.       Left lower leg: She exhibits no tenderness, no bony tenderness, no swelling, no edema, no deformity and no laceration.  No LE edema seen. Will strength, sensation, pulses, and capillary refill in lower extremity.  Neurological: She is alert and oriented to person, place, and time. She has normal reflexes. She displays normal reflexes. No cranial nerve deficit. She exhibits normal muscle tone. Coordination normal.  Skin: Skin is warm and dry. Capillary refill takes less than 2 seconds. No rash noted.  Psychiatric: She has a normal mood and affect.  Nursing note and vitals reviewed.    ED Treatments / Results  Labs (all labs ordered are listed, but only abnormal results are displayed) Labs Reviewed  COMPREHENSIVE METABOLIC PANEL - Abnormal; Notable for the following:       Result Value   BUN <5 (*)    Calcium 8.7 (*)    Albumin 2.9 (*)    AST 73 (*)    Total Bilirubin 1.3 (*)    All other components within normal limits  CBC WITH DIFFERENTIAL/PLATELET - Abnormal; Notable for the following:    RBC 3.86 (*)    HCT 35.9 (*)    RDW 19.2 (*)    Neutro Abs 1.6 (*)    All other components within normal limits  AMMONIA - Abnormal; Notable for the following:    Ammonia 52 (*)    All other components within normal limits  ETHANOL - Abnormal; Notable for the following:    Alcohol, Ethyl (B) 50 (*)    All other components within normal limits  URINE CULTURE  URINALYSIS, ROUTINE W REFLEX MICROSCOPIC (NOT AT [facility])  BRAIN NATRIURETIC PEPTIDE  I-STAT TROPOININ, ED    EKG  EKG Interpretation  Date/Time:  Thursday July 28 2016 13:57:03 EDT Ventricular Rate:  97 PR Interval:  138 QRS Duration: 66 QT Interval:  354 QTC Calculation: 449 R Axis:   74 Text Interpretation:  Normal sinus  rhythm Septal infarct , age undetermined Abnormal ECG No significant change from prior  No STEMI Confirmed by [facility] MD, [place] [id number]) on 07/28/2016 4:29:51 PM       Radiology Dg Chest 2 View  Result Date: 07/28/2016 CLINICAL DATA:  Chest pain, bilateral swelling and aching in the legs and feet EXAM: CHEST  2 VIEW COMPARISON:  06/02/2016 FINDINGS: No acute infiltrate, consolidation or effusion. Globular cardiac enlargement similar compared to prior. Pulmonary vascularity within normal limits. Atherosclerosis of the aorta. No pneumothorax. IMPRESSION: 1. Mild globular cardiomegaly without overt failure 2. No acute infiltrate 3. Atherosclerosis of the aorta Electronically Signed   By: Donavan Foil M.D.   On: 07/28/2016 17:08    Procedures Procedures (including critical care time)  Medications Ordered in ED Medications  aspirin chewable tablet 324 mg (324 mg Oral Given 07/28/16 1708)     Initial Impression / Assessment and Plan / ED Course  I have reviewed the triage vital signs and the nursing notes.  Pertinent labs & imaging results that were available during my care of the patient were reviewed by me and considered in my medical decision making (see chart for details).  Clinical Course    Lisa [name] is a 60 y.o. female with a past medical history significant for hepatitis C, alcohol abuse, cirrhosis of the liver, hypertension, and mitral valve prolapse presents for leg swelling, leg pain, chest pain, right shoulder pain and worsening fatigue.  History and exam are seen above.  On exam, patient has tenderness in the left chest. She also has mild tenderness in her abdomen. No significant back tenderness or CVA tenderness. Mild tenderness in her right shoulder. Patient able to touch her left shoulder with her right hand, doubt shoulder dislocation. Normal strength, sensation, and pulses in all extremity. No numbness in left calf. No significant edema appreciated in left leg.  Patient reports mild subjective swelling in the left ankle area. No tenderness. Neuro exam intact. Lungs clear.  Patient initially evaluated for fatigue and leg swelling however, after history gathering, patient is describing concerning chest pain. Patient will be given aspirin and will be worked up for chest pain. Patient will also have DVT study of left leg given the reported significant edema last night and some reported mild ankle edema at this time. Patient will have workup to look for cause of fatigue as well. X-ray will be obtained to evaluate for right shoulder for injury sustained while pole dancing.  Patient's EKG appeared similar to prior. Patient will have chest x-ray, lab testing, and will likely require admission for chest pain as her HEAR score is a 5 making her at higher risk for major adverse cardiac event.  Patient is still awaiting ultrasound of her leg however, some of her blood work has returned. Patient has negative initial troponin, alcohol detectable at 50, ammonia 52 which is improved from prior. Urinalysis shows no evidence of infection or hematuria. CMP shows elevation in AST of 73. Electrodes otherwise unremarkable. T bili elevated at 1.3.   Chest x-ray shows cardiomegaly but no evidence of infiltrate.  Patient reassessed after this workup and she is continued to have chest pain and is continuing to feel very fatigued. With all of her symptoms and her elevated heart score, patient felt appropriate for admission for further chest pain workup.  PT will be admitted to internal medicine service.    Final Clinical Impressions(s) / ED Diagnoses   Final diagnoses:  Chest pain, unspecified type    Clinical Impression: 1. Chest pain, unspecified type     Disposition: Admit to Internal  Medicine    Gwenyth Allegra Eliyana Pagliaro, MD 07/28/16 2001

## 2016-07-28 NOTE — ED Triage Notes (Signed)
Pt reports bilateral leg swelling with left being worse. States they ache. States started yesterday. Had MRI for liver cirrhosis. Admits to drinking too much this week. Anemic and reports "just tired".

## 2016-07-28 NOTE — H&P (Signed)
Date: 07/28/2016               Patient Name:  Lisa [name] MRN: [id number]  DOB: 1955/12/10 Age / Sex: 60 y.o., female   PCP: Veatrice Bourbon, MD         Medical Service: Internal Medicine Teaching Service         Attending Physician: Dr. Lucious Groves, DO    First Contact: Dr. Alphonzo Grieve Pager: [phone]  Second Contact: Dr. Burgess Estelle Pager: [phone]       After Hours (After 5p/  First Contact Pager: [phone]  weekends / holidays): Second Contact Pager: [phone]   Chief Complaint: Bilateral leg swelling, chest pain, right shoulder pain  History of Present Illness: This is a 60 year old female with medical history significant for cirrhosis secondary to hepatitis C and alcohol abuse, liver mass concerning for HCC, hypertension, polysubstance abuse including tobacco abuse who presents to the hospital for evaluation of leg swelling, chest pain and right shoulder pain. The patient endorses a several month history of bilateral foot pain and thigh pain made worse by walking. She had normal ABI's 11/2014. Reports that last night her left leg was swollen from her ankle to her knee and denies any similar episodes of leg swelling. In regards to the patient's chest pain, she reports occasional intermittent chest pain x several years described as what feels like a muscle spasm or pressure which she attributes to her mitral valve prolapse. Chest pain is reproducible to palpation and she denies any associated nausea, diaphoresis or shortness of breath. She states that when the pain is present she can palpate what feels like a  "swollen vein." She reports chest pain is not associated with activity and can come on at rest. She also reports tingling down her left arm however does not appear to be associated with chest pain. She also endorses a several month history of shortness of breath with activity noting she is unable to walk to the mailbox without becoming short of breath. In regards to her right  shoulder pain the patient reports this began approximately 2 weeks ago when she was pole-dancing on a party bus. She denies any numbness, tingling or weakness of her right arm and endorses full ROM.  In the ED vital signs showed a temp of 98.6, pulse of 81, respirations 17 and blood pressure 190/88 although patient endorses taking only 1 of 3 antihypertensives this morning. She was saturating 97% on room air. CMP showed an elevated AST at 73, elevated T bili at 1.3 and elevated ammonia at 52. CBC was without leukocytosis. BNP 106.EKG showed normal sinus rhythm without evidence for STEMI and was essentially unchanged from prior exam 7/17. Two-view chest x-ray showed mild cardiomegaly but was without any acute infiltrate. Left lower extremity venous ultrasound without evidence for DVT. Initial troponin 0. Right Shoulder x-ray without any acute abnormality. She had no episodes of SOB during her time in the ED and was able to tolerate PO intake. Chest pain was only present during palpation. She was subsequently admitted for evaluation of left lower STEMI swelling and chest pain.    Meds:  Current Meds  Medication Sig  . amLODipine (NORVASC) 10 MG tablet Take 1 tablet (10 mg total) by mouth daily.  Marland Kitchen aspirin 81 MG tablet Take 81 mg by mouth daily.  . ferrous sulfate 325 (65 FE) MG tablet Take 1 tablet (325 mg total) by mouth daily with breakfast.  . hydrochlorothiazide (HYDRODIURIL) 25  MG tablet Take 1 tablet (25 mg total) by mouth daily.  . hydrOXYzine (ATARAX/VISTARIL) 10 MG tablet Take 10 mg by mouth 3 (three) times daily as needed for anxiety (or sleep).   Marland Kitchen lisinopril (PRINIVIL,ZESTRIL) 20 MG tablet Take 1 tablet (20 mg total) by mouth daily.  . mometasone-formoterol (DULERA) 200-5 MCG/ACT AERO Inhale 1 puff into the lungs 2 (two) times daily. (Patient taking differently: Inhale 1 puff into the lungs 2 (two) times daily as needed for shortness of breath. )  . naphazoline-pheniramine (NAPHCON-A)  0.025-0.3 % ophthalmic solution Place 1 drop into both eyes every 4 (four) hours as needed for irritation.  . Thiamine HCl (VITAMIN B-1) 250 MG tablet Take 1 tablet (250 mg total) by mouth daily.  . vitamin B-12 (CYANOCOBALAMIN) 250 MCG tablet Take 1 tablet (250 mcg total) by mouth daily.   Allergies: Allergies as of 07/28/2016 - Review Complete 07/28/2016  Allergen Reaction Noted  . Celecoxib Hives 03/17/2010  . Sulfonamide derivatives Hives and Rash 03/17/2010   Past Medical History:  Diagnosis Date  . Alcohol abuse, daily use   . Cirrhosis of liver ([place])   . Helicobacter pylori gastritis   . Hepatitis C   . HTN (hypertension)   . Hx of cardiovascular stress test    Lexiscan Myoview 3/16: No ischemia, EF 66%, normal study  . Leg pain    ABIs 3/16:  normal   . Mitral valve prolapse   . Osteopetrosis   . Poor dental hygiene   . Tobacco use    Family History:  Mother: Deceased due to complications of heart disease and vascular disease Father: Deceased due to prostate cancer.  Sister: Lupus   Sister: Stroke, heart disease           Social History: Patient reports smoking one half pack per day x "many years" although smoked 1-2 packs per day previously. She endorses daily alcohol abuse and endorsed tremors when she does not drink. She  occasional marijuana and crack cocaine use.   Review of Systems: A complete ROS was negative except as per HPI.   Physical Exam: Blood pressure 197/90, pulse 81, temperature 98.6 F (37 C), temperature source Oral, resp. rate 14, height 5\' 4"  (1.626 m), weight 112 lb (50.8 kg), SpO2 99 %. General: African [place] female who appears older than stated age. Resting comfortably in bed. In no acute distress. Pleasant and joking. HENT: PERLL. Poor dentition however no overt abscess.  Cardiovascular: Regular rate and rhythm. 2/6 systolic murmur appreciated best heard at the LSB Pulmonary: CTA BL. No wheezes, rhonchi or rubs Abdomen: Soft,  non-distended, not tense. Mild TTP of RUQ with deep palpation. Extremities: Distal pulses 2+ BL upper and lower extremities. No peripheral edema or signs of chronic venous insufficiency. No suspicious lesions or wounds Neuro: Oriented x3 (person, place, year). Strength and sensation grossly intact throughout.  MSK: Chest pain reproducible to palpation of left anterior chest wall lateral to the sternum. Patient exhibits full ROM and strength of BL UE.   EKG:   EKG Interpretation  Date/Time:  Thursday July 28 2016 13:57:03 EDT Ventricular Rate:  97 PR Interval:  138 QRS Duration: 66 QT Interval:  354 QTC Calculation: 449 R Axis:   74 Text Interpretation:  Normal sinus rhythm Septal infarct , age undetermined Abnormal ECG No significant change from prior No STEMI Confirmed by TEGELER MD, CHRISTOPHER [phone]) on 07/28/2016 4:29:51 PM      CXR: Mild globular cardiomegaly without evidence for infiltrate. Aortic  atherosclerosis.   Assessment & Plan by Problem: Active Problems:   HYPERTENSION, BENIGN   Chronic hepatitis C without hepatic coma (HCC)   Alcohol abuse   Chest pain   Neuropathic pain   Tobacco abuse   Cirrhosis of liver without ascites (HCC)   Aortic atherosclerosis (HCC)   Polysubstance abuse  Chest pain Patient endorses a long history of intermittent and sporadic chest pain described as what feels like muscle spasm or pressure. Denies association with activity and patient believes this chest pain is likely related to her mitral valve disease or a vein. This chest pain is reproducible to palpation and she reports she feels what seems to be a vein pop-up during these episodes. EKG performed in the emergency department showed normal sinus rhythm without evidence for STEMI and was essentially unchanged from EKG 7/17. Initial troponin in the ED 0. Chest x-ray showed mild cardiomegaly without evidence for infiltration. This chest pain is likely non-cardiac however she does have  multiple risk factors for CAD including tobacco abuse, HTN and family history. She had a normal stress test 11/2014  -Trending Troponins, initial negative. Subsequent 0.03.  -Repeat EKG for AM -On tele  Hypertension Patient reports a long history of hypertension and endorses compliance with her medications however reports she only took 1 of 3 antihypertensives this morning. Her SBP in ED was elevated ranging from 170-190, likely secondary to medication non-compliance. Pt endorses diet rich in sodium including fast foods and fried foods. Denies any symptoms of HA or blurry vision. -Restart full antihypertensive regimen including Lisinopril 20 mg, HCTZ 25 mg and Amlodipine 10 mg.  -Will monitor   Cirrhosis secondary to Hepatitis C and Alcohol Abuse, Liver mass likely [facility] The patient see's Dr. Linus Salmons of ID regularly, most recently 9/17 as well as Dr. Fuller Plan with GI. She completed a course of Zepatier in 2015 and had a negative viral load after treatment. Recent MRI ordered by this physician demonstrated an enlarging hepatic mass, concerning for Hepatocellular carcinoma. Patient does not appear to have a baseline AFP in our system or care everywhere so baseline AFP will be added to morning labs.  -Elevated AST, T-bili, ammonia all consistent with cirrhotic liver disease, appear to be chronically elevated.  -AFP -PT/INR -CMP -CBC  Left Lower Extremity Swelling, Bilateral Foot Pain Left lower extremity venous ultrasound negative for DVT or bakers cyst. Patient reports a long history of bilateral foot and thigh pain made worse by walking. She reports yesterday she developed bilateral leg swelling with L>R extending from her knee to ankle. Denies any prior similar episodes. There was no swelling or tenderness on physical exam. She had an echocardiogram performed 11/15 for evaluation of a systolic murmur which showed an EF of 65-70% with grade 2 diastolic dysfunction. Echo also demonstrated concentric  hypertrophy consistent with HTN and noted only mild mitral regurgitation. It was recommended on this study that the patient have a f/u ECHO in 2 years however this has not been completed yet. The patient did have a slightly elevated BNP at 106 in addition to mild cardiomegaly. Could consider TTE inpatient vs outpatient however does not need urgently at this time.  -Could consider TTE inpatient vs outpatient. Will need f/u ECHO on discharge with PCP -PT/OT to eval.  Polysubstance abuse, alcohol abuse Patient endorses a long history of tobacco abuse, alcohol use in addition to occasional marijuana and crack cocaine use as well. She reports she drinks daily, last use yesterday and believe she had only 2  40 oz beers. Her ethanol level in the ED was 50 and ED staff noted she smelled of alcohol. She reports she has been getting tremors lately when she goes without drinking. -Smoking cessation was encouraged in addition to the remainder aforementioned drugs. -UDS positive for cocaine -CIWA   DVT Prophylaxis: Lovenox Code Status: FULL CODE Diet: Heart healthy IVF: NSL  Dispo: Admit patient to Observation with expected length of stay less than 2 midnights.  SignedEinar Gip, DO 07/28/2016, 8:08 PM  Pager: [phone]

## 2016-07-29 ENCOUNTER — Encounter (HOSPITAL_COMMUNITY): Payer: Self-pay | Admitting: *Deleted

## 2016-07-29 ENCOUNTER — Observation Stay (HOSPITAL_BASED_OUTPATIENT_CLINIC_OR_DEPARTMENT_OTHER): Payer: No Typology Code available for payment source

## 2016-07-29 DIAGNOSIS — R079 Chest pain, unspecified: Secondary | ICD-10-CM

## 2016-07-29 DIAGNOSIS — F1010 Alcohol abuse, uncomplicated: Secondary | ICD-10-CM

## 2016-07-29 DIAGNOSIS — B182 Chronic viral hepatitis C: Secondary | ICD-10-CM

## 2016-07-29 DIAGNOSIS — I700 Atherosclerosis of aorta: Secondary | ICD-10-CM

## 2016-07-29 LAB — COMPREHENSIVE METABOLIC PANEL
ALT: 24 U/L (ref 14–54)
ANION GAP: 6 (ref 5–15)
AST: 66 U/L — ABNORMAL HIGH (ref 15–41)
Albumin: 2.6 g/dL — ABNORMAL LOW (ref 3.5–5.0)
Alkaline Phosphatase: 89 U/L (ref 38–126)
BUN: 5 mg/dL — ABNORMAL LOW (ref 6–20)
CHLORIDE: 102 mmol/L (ref 101–111)
CO2: 27 mmol/L (ref 22–32)
Calcium: 8.8 mg/dL — ABNORMAL LOW (ref 8.9–10.3)
Creatinine, Ser: 0.54 mg/dL (ref 0.44–1.00)
Glucose, Bld: 101 mg/dL — ABNORMAL HIGH (ref 65–99)
POTASSIUM: 3.1 mmol/L — AB (ref 3.5–5.1)
SODIUM: 135 mmol/L (ref 135–145)
Total Bilirubin: 1.5 mg/dL — ABNORMAL HIGH (ref 0.3–1.2)
Total Protein: 7.4 g/dL (ref 6.5–8.1)

## 2016-07-29 LAB — CBC
HCT: 37 % (ref 36.0–46.0)
HEMOGLOBIN: 12.7 g/dL (ref 12.0–15.0)
MCH: 31.8 pg (ref 26.0–34.0)
MCHC: 34.3 g/dL (ref 30.0–36.0)
MCV: 92.5 fL (ref 78.0–100.0)
Platelets: 259 10*3/uL (ref 150–400)
RBC: 4 MIL/uL (ref 3.87–5.11)
RDW: 19.3 % — ABNORMAL HIGH (ref 11.5–15.5)
WBC: 6.5 10*3/uL (ref 4.0–10.5)

## 2016-07-29 LAB — RAPID URINE DRUG SCREEN, HOSP PERFORMED
AMPHETAMINES: NOT DETECTED
BENZODIAZEPINES: NOT DETECTED
Barbiturates: NOT DETECTED
Cocaine: POSITIVE — AB
Opiates: NOT DETECTED
TETRAHYDROCANNABINOL: NOT DETECTED

## 2016-07-29 LAB — TROPONIN I: TROPONIN I: 0.03 ng/mL — AB (ref <0.03)

## 2016-07-29 LAB — ECHOCARDIOGRAM COMPLETE
HEIGHTINCHES: 63 in
WEIGHTICAEL: 1834.23 [oz_av]

## 2016-07-29 LAB — PROTIME-INR
INR: 1.17
PROTHROMBIN TIME: 15 s (ref 11.4–15.2)

## 2016-07-29 MED ORDER — AMLODIPINE BESYLATE 10 MG PO TABS: 10.0000 mg | ORAL_TABLET | Freq: Every day | ORAL | Status: DC

## 2016-07-29 MED ORDER — AMLODIPINE BESYLATE 10 MG PO TABS
10.0000 mg | ORAL_TABLET | Freq: Once | ORAL | Status: AC
  Administered 2016-07-29: 10 mg via ORAL
  Filled 2016-07-29: qty 1

## 2016-07-29 MED ORDER — HYDROCHLOROTHIAZIDE 25 MG PO TABS: 25.0000 mg | ORAL_TABLET | Freq: Every day | ORAL | Status: DC

## 2016-07-29 MED ORDER — POTASSIUM CHLORIDE CRYS ER 20 MEQ PO TBCR
40.0000 meq | EXTENDED_RELEASE_TABLET | Freq: Two times a day (BID) | ORAL | Status: DC
  Administered 2016-07-29: 40 meq via ORAL
  Filled 2016-07-29 (×2): qty 2

## 2016-07-29 MED ORDER — HYDROCHLOROTHIAZIDE 25 MG PO TABS
25.0000 mg | ORAL_TABLET | Freq: Once | ORAL | Status: AC
  Administered 2016-07-29: 25 mg via ORAL
  Filled 2016-07-29: qty 1

## 2016-07-29 NOTE — Progress Notes (Signed)
  Echocardiogram 2D Echocardiogram has been performed.  Jennette Dubin 07/29/2016, 3:21 PM

## 2016-07-29 NOTE — Evaluation (Signed)
Physical Therapy Evaluation Patient Details Name: Lisa [name] MRN: [id number] DOB: 11-Apr-1956 Today's Date: 07/29/2016   History of Present Illness  60 year old female presenting with leg swelling, chest pain and right shoulder pain.  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   Clinical Impression  Patient presents with decreased independence with mobility due to deficits listed in PT problem list.  She will benefit from skilled PT in the acute setting to allow return home with intermittent assist and follow up HHPT.     Follow Up Recommendations Home health PT    Equipment Recommendations  Cane    Recommendations for Other Services       Precautions / Restrictions Precautions Precautions: Fall Precaution Comments: reports 2 falls in 6 months      Mobility  Bed Mobility Overal bed mobility: Modified Independent                Transfers Overall transfer level: Needs assistance   Transfers: Sit to/from Stand Sit to Stand: Supervision         General transfer comment: from EOB with some unsteadiness supervision for safety, from toilet with grabbar supervision for safety  Ambulation/Gait Ambulation/Gait assistance: Min guard;Min assist Ambulation Distance (Feet): 180 Feet Assistive device:  (using wall rail intermittently) Gait Pattern/deviations: Step-through pattern;Decreased stride length;Drifts right/left;Scissoring     General Gait Details: imbalance with walking, improves with use of wall rail, pt reports imbalance is new as is LE burning, tingling pain  Stairs            Wheelchair Mobility    Modified Rankin (Stroke Patients Only)       Balance Overall balance assessment: Needs assistance   Sitting balance-Leahy Scale: Good     Standing balance support: No upper extremity supported Standing balance-Leahy Scale:  Fair Standing balance comment: toileting unaided, assist for ambulation versus use of rail                             Pertinent Vitals/Pain Pain Assessment: Faces Faces Pain Scale: Hurts even more Pain Location: bilateral legs/thighs Pain Descriptors / Indicators: Aching Pain Intervention(s): Monitored during session;Repositioned    Home Living Family/patient expects to be discharged to:: Private residence Living Arrangements: Children;Other relatives Available Help at Discharge: Family;Available PRN/intermittently Type of Home: Apartment Home Access: Level entry     Home Layout: One level Home Equipment: Wheelchair - manual      Prior Function Level of Independence: Independent         Comments: worked as Engineer, production for one Secretary/administrator        Extremity/Trunk Assessment   Upper Extremity Assessment: Overall WFL for tasks assessed           Lower Extremity Assessment: Generalized weakness         Communication   Communication: No difficulties  Cognition Arousal/Alertness: Awake/alert Behavior During Therapy: WFL for tasks assessed/performed Overall Cognitive Status: Within Functional Limits for tasks assessed                      General Comments General comments (skin integrity, edema, etc.): L knee with abrasion from previous fall and showed my area on L lateral foot which she states was injured and developed into cellulitis in the past    Exercises     Assessment/Plan  PT Assessment Patient needs continued PT services  PT Problem List Decreased strength;Decreased activity tolerance;Decreased balance;Decreased mobility;Decreased safety awareness;Decreased knowledge of use of DME;Pain          PT Treatment Interventions DME instruction;Therapeutic activities;Gait training;Therapeutic exercise;Patient/family education;Functional mobility training;Balance training    PT Goals (Current goals can be found in the Care  Plan section)  Acute Rehab PT Goals Patient Stated Goal: To return to independent PT Goal Formulation: With patient Time For Goal Achievement: 08/01/16 Potential to Achieve Goals: Good    Frequency Min 3X/week   Barriers to discharge        Co-evaluation               End of Session   Activity Tolerance: Patient limited by pain Patient left: in bed;with call bell/phone within reach      Functional Assessment Tool Used: Clinical Judgement Functional Limitation: Mobility: Walking and moving around Mobility: Walking and Moving Around Current Status [id number]): At least 20 percent but less than 40 percent impaired, limited or restricted Mobility: Walking and Moving Around Goal Status [phone]): At least 1 percent but less than 20 percent impaired, limited or restricted    Time: 1610-1633 PT Time Calculation (min) (ACUTE ONLY): 23 min   Charges:   PT Evaluation $PT Eval Moderate Complexity: 1 Procedure PT Treatments $Gait Training: 8-22 mins   PT G Codes:   PT G-Codes **NOT FOR INPATIENT CLASS** Functional Assessment Tool Used: Clinical Judgement Functional Limitation: Mobility: Walking and moving around Mobility: Walking and Moving Around Current Status [id number]): At least 20 percent but less than 40 percent impaired, limited or restricted Mobility: Walking and Moving Around Goal Status [phone]): At least 1 percent but less than 20 percent impaired, limited or restricted    Reginia Naas 07/29/2016, 4:41 PM  Magda Kiel, [place] 07/29/2016

## 2016-07-29 NOTE — Progress Notes (Signed)
CRITICAL VALUE ALERT  Critical value received:  Troponin 0.03  Date of notification:  07/29/2016  Time of notification:  01:53  Critical value read back: Yes  Nurse who received alert:  Jairo Ben, RN  MD notified (1st page):  Einar Gip, DO  Time of first page:  01:56  MD notified (2nd page):  Time of second page:  Responding MD:  Einar Gip, DO  Time MD responded:  01:57

## 2016-07-29 NOTE — Progress Notes (Signed)
Family Medicine Teaching Service Daily Progress Note Intern Pager: [phone]  Patient name: Lisa [name] Medical record number: [id number] Date of birth: [birth year] Age: 60 y.o. Gender: female  Primary Care Provider: Marina Goodell, MD Consultants: Cardiology Code Status: FULL  Pt Overview and Major Events to Date:  Admit 11/2  Assessment and Plan: 60 year old female presenting with leg swelling, chest pain and right shoulder pain.  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   Chest pain. Endorses a long history of intermittent and sporadic chest pain described as muscle spasm or pressure. Denies association with activity and patient believes this chest pain is likely related to her mitral valve disease or a vein. Chest pain is reproducible to palpation and she reports she feels what seems to be a vein pop-up during these episodes. EKG in ED showed normal sinus rhythm without evidence for STEMI and was essentially unchanged from EKG 7/17. Initial troponin in the ED 0.00. CXR showed mild cardiomegaly without evidence for infiltration.  Heart Score of 3.  Chest pain is likely non-cardiac however with multiple risk factors for CAD including tobacco abuse, HTN and family history. Normal stress test 11/2014.  Chest pain mostly resolved this AM with no shortness of breath.  Reproducible on exam with palpation so likely musculoskeletal.   -Trending Troponins, initial negative. Subsequent 0.03, <0.03.  -Repeat EKG this AM - NSR -Echocardiogram ordered -further cardiac workup not warranted if echo normal -Monitor on telemetry -PT/OT recs -vitals per unit routine  Hypertension BP this AM 130/67.  Her SBP in ED was elevated ranging from 170-190, likely secondary to medication non-compliance. Denies any symptoms of HA or blurry vision. -Restart Lisinopril 20 mg, HCTZ 25 mg and Amlodipine 10 mg.   -Will monitor   Cirrhosis secondary to Hepatitis C and Alcohol Abuse, Liver mass likely [facility] The patient see's Dr. Linus Salmons of ID regularly, most recently 9/17 as well as Dr. Fuller Plan with GI. She completed a course of Zepatier in 2015 and had a negative viral load after treatment. Recent MRI ordered by this physician demonstrated an enlarging hepatic mass, concerning for Hepatocellular carcinoma. Patient does not appear to have a baseline AFP in our system or care everywhere so baseline AFP will be added to morning labs.  INR1.17 wnl. PT -Elevated AST, T-bili, ammonia all consistent with cirrhotic liver disease, appear to be chronically elevated.  -AFP pending  Left Lower Extremity Swelling, Bilateral Foot Pain Left lower extremity venous ultrasound negative for DVT or bakers cyst. Patient reports a long history of bilateral foot and thigh pain made worse by walking. She reports yesterday she developed bilateral leg swelling with L>R extending from her knee to ankle. Denies any prior similar episodes. There was no swelling or tenderness on physical exam. She had an echocardiogram performed 11/15 for evaluation of a systolic murmur which showed an EF of 65-70% with grade 2 diastolic dysfunction. Echo also demonstrated concentric hypertrophy consistent with HTN and noted only mild mitral regurgitation. It was recommended on this study that the patient have a f/u ECHO in 2 years however this has not been completed yet. The patient did have a slightly elevated BNP at 106 in addition to mild cardiomegaly. Could consider TTE inpatient vs outpatient however does not need urgently at this time.  -Echo pending  Polysubstance abuse, alcohol abuse  Patient endorses a long history of tobacco abuse, alcohol use in addition to occasional marijuana  and crack cocaine use as well. She reports she drinks daily, last use yesterday and believe she had only 2 40 oz beers. Her ethanol level in the ED was 50 and ED staff noted  she smelled of alcohol. She reports she has been getting tremors lately when she goes without drinking  -Smoking cessation was encouraged in addition to the remainder aforementioned drugs. -UDS positive for cocaine -CIWA scores so far 5, 0, 0   DVT Prophylaxis: Lovenox Diet: Heart healthy IVF: NSL  Disposition: Home today pending echo  Subjective:  Patient states she is feeling much better this AM.  Has no shortness of breath and reports less swelling in her lower extremities.   Objective: Temp:  [98.3 F (36.8 C)-98.4 F (36.9 C)] 98.3 F (36.8 C) (11/03 1351) Pulse Rate:  [77-88] 87 (11/03 1351) Resp:  [14-19] 19 (11/03 1351) BP: (130-197)/(66-92) 143/66 (11/03 1351) SpO2:  [95 %-100 %] 100 % (11/03 1351) Weight:  [114 lb 10.2 oz (52 kg)-115 lb 1.6 oz (52.2 kg)] 114 lb 10.2 oz (52 kg) (11/03 0504) Physical Exam: General: 60 yo F resting comfortably in bed. In no acute distress HENT: PERLL, MMM Cardiovascular: RRR,  2/6 systolic murmur appreciated best heard at the LSB Pulmonary: CTA BL. No wheezes, rhonchi or rubs Abdomen: Soft, non-distended, not tense. Mild TTP of RUQ with deep palpation. Extremities: Distal pulses 2+ BL upper and lower extremities. No peripheral edema or signs of chronic venous insufficiency. No suspicious lesions or wounds Neuro: Oriented x3. Strength and sensation grossly intact throughout.  MSK: Chest pain reproducible to palpation   Laboratory:  Recent Labs Lab 07/28/16 1400 07/29/16 0541  WBC 4.8 6.5  HGB 12.3 12.7  HCT 35.9* 37.0  PLT 277 259    Recent Labs Lab 07/28/16 1400 07/29/16 0541  NA 139 135  K 3.7 3.1*  CL 108 102  CO2 24 27  BUN <5* 5*  CREATININE 0.59 0.54  CALCIUM 8.7* 8.8*  PROT 8.0 7.4  BILITOT 1.3* 1.5*  ALKPHOS 91 89  ALT 27 24  AST 73* 66*  GLUCOSE 78 101*   Imaging/Diagnostic Tests: Dg Chest 2 View  Result Date: 07/28/2016 CLINICAL DATA:  Chest pain, bilateral swelling and aching in the legs and feet  EXAM: CHEST  2 VIEW COMPARISON:  06/02/2016 FINDINGS: No acute infiltrate, consolidation or effusion. Globular cardiac enlargement similar compared to prior. Pulmonary vascularity within normal limits. Atherosclerosis of the aorta. No pneumothorax. IMPRESSION: 1. Mild globular cardiomegaly without overt failure 2. No acute infiltrate 3. Atherosclerosis of the aorta Electronically Signed   By: Donavan Foil M.D.   On: 07/28/2016 17:08   Dg Shoulder Right  Result Date: 07/28/2016 CLINICAL DATA:  Right shoulder pain.  No reported injury. EXAM: RIGHT SHOULDER - 2+ VIEW COMPARISON:  None. FINDINGS: Cystic changes in the lateral aspect of the humeral head extension through the cortex at the proximal aspect of the greater tuberosity. Otherwise, unremarkable bones and soft tissues. IMPRESSION: No acute abnormality. Degenerative cystic changes in the lateral aspect of the humeral head at the greater tuberosity. Electronically Signed   By: Claudie Revering M.D.   On: 07/28/2016 18:31   Lovenia Kim, MD 07/29/2016, 2:15 PM PGY-1, [place] Intern pager: [phone], text pages welcome

## 2016-07-29 NOTE — Progress Notes (Signed)
Orders received to discharge patient.  Patient expresses readiness to discharge.  Discharge instructions, follow up, medications and instructions for their use were discussed with patient and patient voiced understanding. PIV access removed without complication.  Telemetry monitor removed and CCMD notified.

## 2016-07-29 NOTE — Progress Notes (Signed)
BP was 175/72 and both arms were checked. No PRN meds were ordered for hypertension.  The physician on call was informed and ordered one time dose of Norvasc 10 mg and Hydrodiuril 25 mg by mouth. Meds have been given. Pt resting in bed with call bell. Will continue to monitor pt.  Lupita Dawn, RN

## 2016-07-30 LAB — URINE CULTURE

## 2016-07-30 LAB — AFP TUMOR MARKER: AFP-Tumor Marker: 11.1 ng/mL — ABNORMAL HIGH (ref 0.0–8.3)

## 2016-07-31 NOTE — Discharge Summary (Signed)
[facility] Discharge Summary  Patient name: Lisa [name] Medical record number: [id number] Date of birth: [birth year] Age: 60 y.o. Gender: female Date of Admission: 07/28/2016  Date of Discharge: 07/29/2016 Admitting Physician: Dickie La, MD  Primary Care Provider: Marina Goodell, MD Consultants: None  Indication for Hospitalization: chest pain, leg swelling and right shoulder pain  Discharge Diagnoses/Problem List:  Active Problems:   HYPERTENSION, BENIGN   Chronic hepatitis C without hepatic coma ([place])   Alcohol abuse   Chest pain   Neuropathic pain   Tobacco abuse   Cirrhosis of liver without ascites ([place])   Aortic atherosclerosis ([place])   Polysubstance abuse  Disposition: Home  Discharge Condition: Stable, improved  Discharge Exam:  General: 60 yo F resting comfortably in bed, in NAD HENT:PERLL, MMM, oropharynx clear Cardiovascular: RRR,  2/6 systolic murmur appreciated best heard at the LSB Pulmonary:CTA BL. No wheezes, rhonchi or rubs Abdomen: Soft, non-distended, NT, +bs Extremities: Distal pulses 2+ BL upper and lower extremities Neuro: Oriented x3. Strength and sensation grossly intact throughout.  [id number] pain reproducible to palpation, no edema Psych: mood appropriate  Brief Hospital Course:  60 year old female presenting with leg swelling, chest pain and right shoulder pain.  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   Presented to the ED c/o chest pain and leg swelling.  VSS on admission. UDS+ for cocaine.  Complaining of occasional intermittent chest pain x several years described as what feels like a muscle spasm or pressure which she attributes to her mitral valve prolapse. Was admitted to [facility] for acs r/o.  Also placed on CIWA for her history of alcohol and polysubstance abuse.  EKG showed NSR without evidence for  STEMI and essentially unchanged from EKG on 7/17.  Left lower extremity venous ultrasound without evidence for DVT.  Troponins negative x 3, CXR with mild cardiomegaly without evidence for infiltration. Chest pain is likely non-cardiac however with multiple risk factors for CAD including tobacco abuse, HTN and family history. Had a normal stress test 11/2014.  CIWA scores remained low over the course of her stay.  Right Shoulder x-ray without any acute abnormality. She had no episodes of SOB or chest pain while admitted and was able to tolerate PO intake. Echocardiogram showed EF of 65-70% with G2DD.  As cardiac workup negative and chest pain resolved, she was stable for discharge home.  Her cardiologist (Dr. Irish Lack ) was informed of the admission and will follow as outpatient.    Issues for Follow Up:  1. Will follow up with Dr. Irish Lack outpatient  Significant Procedures: None  Significant Labs and Imaging:   Recent Labs Lab 07/28/16 1400 07/29/16 0541  WBC 4.8 6.5  HGB 12.3 12.7  HCT 35.9* 37.0  PLT 277 259    Recent Labs Lab 07/28/16 1400 07/29/16 0541  NA 139 135  K 3.7 3.1*  CL 108 102  CO2 24 27  GLUCOSE 78 101*  BUN <5* 5*  CREATININE 0.59 0.54  CALCIUM 8.7* 8.8*  ALKPHOS 91 89  AST 73* 66*  ALT 27 24  ALBUMIN 2.9* 2.6*   Dg Chest 2 View  Result Date: 07/28/2016 CLINICAL DATA:  Chest pain, bilateral swelling and aching in the legs and feet EXAM: CHEST  2 VIEW COMPARISON:  06/02/2016 FINDINGS: No acute infiltrate, consolidation or effusion. Globular cardiac enlargement similar compared to prior. Pulmonary vascularity within normal limits. Atherosclerosis  of the aorta. No pneumothorax. IMPRESSION: 1. Mild globular cardiomegaly without overt failure 2. No acute infiltrate 3. Atherosclerosis of the aorta Electronically Signed   By: Donavan Foil M.D.   On: 07/28/2016 17:08   Dg Shoulder Right  Result Date: 07/28/2016 CLINICAL DATA:  Right shoulder pain.  No reported  injury. EXAM: RIGHT SHOULDER - 2+ VIEW COMPARISON:  None. FINDINGS: Cystic changes in the lateral aspect of the humeral head extension through the cortex at the proximal aspect of the greater tuberosity. Otherwise, unremarkable bones and soft tissues. IMPRESSION: No acute abnormality. Degenerative cystic changes in the lateral aspect of the humeral head at the greater tuberosity. Electronically Signed   By: Claudie Revering M.D.   On: 07/28/2016 18:31   Results/Tests Pending at Time of Discharge: None  Discharge Medications:    Medication List    TAKE these medications   amLODipine 10 MG tablet Commonly known as:  NORVASC Take 1 tablet (10 mg total) by mouth daily.   aspirin 81 MG tablet Take 81 mg by mouth daily.   ferrous sulfate 325 (65 FE) MG tablet Take 1 tablet (325 mg total) by mouth daily with breakfast.   hydrochlorothiazide 25 MG tablet Commonly known as:  HYDRODIURIL Take 1 tablet (25 mg total) by mouth daily.   hydrOXYzine 10 MG tablet Commonly known as:  ATARAX/VISTARIL Take 10 mg by mouth 3 (three) times daily as needed for anxiety (or sleep).   lisinopril 20 MG tablet Commonly known as:  PRINIVIL,ZESTRIL Take 1 tablet (20 mg total) by mouth daily.   mometasone-formoterol 200-5 MCG/ACT Aero Commonly known as:  DULERA Inhale 1 puff into the lungs 2 (two) times daily. What changed:  when to take this  reasons to take this   naphazoline-pheniramine 0.025-0.3 % ophthalmic solution Commonly known as:  NAPHCON-A Place 1 drop into both eyes every 4 (four) hours as needed for irritation.   traMADol 50 MG tablet Commonly known as:  ULTRAM Take 1 tablet (50 mg total) by mouth every 12 (twelve) hours as needed for severe pain. What changed:  how much to take   vitamin B-1 250 MG tablet Take 1 tablet (250 mg total) by mouth daily.   vitamin B-12 250 MCG tablet Commonly known as:  CYANOCOBALAMIN Take 1 tablet (250 mcg total) by mouth daily.      Discharge  Instructions: Please refer to Patient Instructions section of EMR for full details.  Patient was counseled important signs and symptoms that should prompt return to medical care, changes in medications, dietary instructions, activity restrictions, and follow up appointments.   Follow-Up Appointments: Follow-up [facility] Follow up.   Specialty:  Cardiology Contact information: [address] [id number] [place] [phone]       Marina Goodell, MD Follow up on 08/03/2016.   Specialty:  Family Medicine Why:  Appointment for follow up at 2 PM.  Contact information: [place] [address] [phone]          Lovenia Kim, MD 07/31/2016, 1:33 PM PGY-1, [place]

## 2016-08-03 ENCOUNTER — Encounter: Payer: Self-pay | Admitting: Student

## 2016-08-03 ENCOUNTER — Ambulatory Visit (INDEPENDENT_AMBULATORY_CARE_PROVIDER_SITE_OTHER): Payer: No Typology Code available for payment source | Admitting: Student

## 2016-08-03 ENCOUNTER — Telehealth: Payer: Self-pay | Admitting: Student

## 2016-08-03 VITALS — BP 141/53 | HR 69 | Temp 97.5°F | Wt 118.0 lb

## 2016-08-03 DIAGNOSIS — I10 Essential (primary) hypertension: Secondary | ICD-10-CM

## 2016-08-03 DIAGNOSIS — Z0000 Encounter for general adult medical examination without abnormal findings: Secondary | ICD-10-CM

## 2016-08-03 DIAGNOSIS — K089 Disorder of teeth and supporting structures, unspecified: Secondary | ICD-10-CM

## 2016-08-03 DIAGNOSIS — F1010 Alcohol abuse, uncomplicated: Secondary | ICD-10-CM

## 2016-08-03 DIAGNOSIS — I2510 Atherosclerotic heart disease of native coronary artery without angina pectoris: Secondary | ICD-10-CM

## 2016-08-03 DIAGNOSIS — F1910 Other psychoactive substance abuse, uncomplicated: Secondary | ICD-10-CM

## 2016-08-03 DIAGNOSIS — Z1211 Encounter for screening for malignant neoplasm of colon: Secondary | ICD-10-CM

## 2016-08-03 MED ORDER — FOLIC ACID 1 MG PO TABS: 1.0000 mg | ORAL_TABLET | Freq: Every day | ORAL | 0 refills | Status: AC

## 2016-08-03 NOTE — Assessment & Plan Note (Signed)
Blood pressure well controlled on current regimen - will continue to follow

## 2016-08-03 NOTE — Telephone Encounter (Signed)
Patient called back saying that she will need a referral for [facility] for her colon cancer screening. Jazmin Hartsell,CMA

## 2016-08-03 NOTE — Progress Notes (Signed)
   Subjective:    Patient ID: Lisa [name], female    DOB: 02-16-1956, 60 y.o.   MRN: [id number]   CC: Hospital Follow up  HPI: 60 year old female presenting for hospital follow-up for chest pain  Hospital follow-up - Since discharge she has not had chest pain, shortness of breath - She has not made an appointment with cardiology -She reports compliance with her medications  Alcohol/drug use  - Reports using cocaine every now and then  - Drinks one 40 ounce beer per day  - She is interested in drug and alcohol cessation    smoking - She smokes up to one pack per day - Reports she is interested in quitting tobacco   Smoking status reviewed  Review of Systems  Per HPI, else denies recent illness, fever, headache, chest pain, shortness of breath, abdominal pain, N/V/D,     Objective:  BP (!) 141/53   Pulse 69   Temp 97.5 F (36.4 C) (Oral)   Wt 118 lb (53.5 kg)   SpO2 100%   BMI 20.90 kg/m  Vitals and nursing note reviewed  General: NAD Cardiac: RRR Respiratory: CTAB, normal effort Extremities: no edema or cyanosis. Skin: warm and dry Neuro: alert and oriented   Assessment & Plan:    HYPERTENSION, BENIGN Blood pressure well controlled on current regimen - will continue to follow  Alcohol abuse Given Alcohol cessation support group and rehab information  - thiamin refilled, folate prescribed  Polysubstance abuse Continues to use cocaine, she was given cessation materials  Cardiovascular disease Patient to follow with her cardiologist, Dr Irish Lack. She was strongly counseled regarding the cardiovascular effects of cocaine use  Health care maintenance Due for pap and colonoscopy. Pap deferred due to pateint preference. GI referral today for colonoscopy Due for flu shot and zostavax, patient declined today -continue to discuss    Panhia Karl A. Lincoln Brigham MD, [facility] Resident PGY-3 Pager [phone]

## 2016-08-03 NOTE — Assessment & Plan Note (Signed)
Patient to follow with her cardiologist, Dr Irish Lack. She was strongly counseled regarding the cardiovascular effects of cocaine use

## 2016-08-03 NOTE — Assessment & Plan Note (Signed)
Continues to use cocaine, she was given cessation materials

## 2016-08-03 NOTE — Assessment & Plan Note (Addendum)
Due for pap and colonoscopy. Pap deferred due to pateint preference. GI referral today for colonoscopy Due for flu shot and zostavax, patient declined today -continue to discuss

## 2016-08-03 NOTE — Assessment & Plan Note (Signed)
Given Alcohol cessation support group and rehab information  - thiamin refilled, folate prescribed

## 2016-08-03 NOTE — Patient Instructions (Addendum)
Follow up for Pap smear Please make an appointment for Colonoscopy, you were given information to do this Please stop drinking, you were given information for resources to help you stop drinking PLEASE STOP USING COCAINE!! This can be harmful to yourself and your family If you have questions or concerns, call the office at [phone]

## 2016-08-03 NOTE — Telephone Encounter (Signed)
Pt called and needs a referral for a colonoscopy. She is also wanting a referral to a dentist. She has the orange card and needs referrals before making appointments. jw

## 2016-08-08 ENCOUNTER — Telehealth: Payer: Self-pay | Admitting: Student

## 2016-08-08 NOTE — Telephone Encounter (Signed)
Lisa [name] will need an abdominal US. She was called regarding this. She agreed to obtain it and wished to discuss it further at her appt on 11/15 when she gets her pap smear. Her [place] will be ordered and scheduled at that time. All questions were answered to her satisfaction

## 2016-08-10 ENCOUNTER — Encounter: Payer: Self-pay | Admitting: Student

## 2016-08-10 ENCOUNTER — Ambulatory Visit (INDEPENDENT_AMBULATORY_CARE_PROVIDER_SITE_OTHER): Payer: No Typology Code available for payment source | Admitting: Student

## 2016-08-10 ENCOUNTER — Telehealth: Payer: Self-pay | Admitting: Student

## 2016-08-10 DIAGNOSIS — Z0000 Encounter for general adult medical examination without abnormal findings: Secondary | ICD-10-CM

## 2016-08-10 DIAGNOSIS — B182 Chronic viral hepatitis C: Secondary | ICD-10-CM

## 2016-08-10 DIAGNOSIS — R058 Other specified cough: Secondary | ICD-10-CM

## 2016-08-10 DIAGNOSIS — R05 Cough: Secondary | ICD-10-CM

## 2016-08-10 MED ORDER — MOMETASONE FURO-FORMOTEROL FUM 200-5 MCG/ACT IN AERO: 1.0000 | INHALATION_SPRAY | Freq: Two times a day (BID) | RESPIRATORY_TRACT | 0 refills | Status: DC

## 2016-08-10 NOTE — Progress Notes (Signed)
   Subjective:    Patient ID: Lisa [name], female    DOB: 09/04/1956, 60 y.o.   MRN: [id number]   CC: Pap and discuss need for transabdominal US  HPI: 60 y/o F with Hep C and recently mildly elevated AFP   Hep C - Follows with Dr Linus Salmons with ID, MRI was ordered but she has not obtained it - AFP on 11.3 was elevated 11.1 and it was recommended that she have a transabdominal US study  Smoking status reviewed Current Smoker  Review of Systems  Per HPI, reports chronic aching, denies unjury    Objective:  BP (!) 153/70   Pulse 84   Temp 98.4 F (36.9 C) (Oral)   Wt 121 lb (54.9 kg)   SpO2 97%   BMI 21.43 kg/m  Vitals and nursing note reviewed  General: NAD Cardiac: RRR Respiratory: CTAB, normal effort Extremities: no edema or cyanosis Skin: warm and dry, no rashes noted Neuro: alert and oriented   Assessment & Plan:    Chronic hepatitis C without hepatic coma (HCC) AFP was elevated to 11.1 - transabdominal US ordered - discussed in depth what an elevated AFP could potentially mean and her increased risk for [facility] given her + hepatitis C.  - she expressed her understanding at the completion of the visit    Doris Gruhn A. Lincoln Brigham MD, [facility] Resident PGY-3 Pager [phone]

## 2016-08-10 NOTE — Assessment & Plan Note (Addendum)
AFP was elevated to 11.1 - transabdominal US ordered - discussed in depth what an elevated AFP could potentially mean and her increased risk for [facility] given her + hepatitis C.  - she expressed her understanding at the completion of the visit

## 2016-08-10 NOTE — Telephone Encounter (Signed)
Pt was confused about what was said at appointment today. Pt believes Dr. Lincoln Brigham said something about cancer. Pt would like to be called tomorrow. Please advise. Thanks! ep

## 2016-08-10 NOTE — Telephone Encounter (Signed)
Will forward to MD. Lisa [name],CMA  

## 2016-08-10 NOTE — Assessment & Plan Note (Deleted)
Pap done today, will follow

## 2016-08-10 NOTE — Patient Instructions (Signed)
Please obtain right upper quadrant ultrasound Please follow with Dr. Dwyane Dee your ID specialist for hepatitis C If you have questions or concerns call the office at [phone]

## 2016-08-11 ENCOUNTER — Telehealth: Payer: Self-pay | Admitting: Student

## 2016-08-11 NOTE — Telephone Encounter (Signed)
Lisa [name] was called to further discuss her obtaining the abdominal US. We discussed why she was to get it. She is to make an appt with Dr Linus Salmons with ID as previously planned. All questions were answered to her satisfaction

## 2016-08-15 NOTE — Telephone Encounter (Signed)
Patient's card expired 06/09/16.  [facility] unable to schedule MRI before card expires

## 2016-08-17 ENCOUNTER — Ambulatory Visit (HOSPITAL_COMMUNITY)
Admission: RE | Admit: 2016-08-17 | Discharge: 2016-08-17 | Disposition: A | Discharge status: Home / Self Care | Payer: No Typology Code available for payment source | Source: Ambulatory Visit | Attending: Family Medicine | Admitting: Family Medicine

## 2016-08-17 DIAGNOSIS — B182 Chronic viral hepatitis C: Secondary | ICD-10-CM | POA: Insufficient documentation

## 2016-08-17 DIAGNOSIS — R188 Other ascites: Secondary | ICD-10-CM | POA: Insufficient documentation

## 2016-08-22 ENCOUNTER — Telehealth: Payer: Self-pay | Admitting: Student

## 2016-08-22 NOTE — Telephone Encounter (Signed)
Called [facility] to obtain records, but they did not answer. A message was left, please call their office at [phone] to try and obtain further records

## 2016-08-22 NOTE — Telephone Encounter (Signed)
Patient came to office and dropped off cd of mri abd. Placed in Dr Etta Grandchild box.

## 2016-08-22 NOTE — Telephone Encounter (Signed)
Will forward to MD to make aware. Khanh Tanori,CMA  

## 2016-08-22 NOTE — Telephone Encounter (Signed)
Patient called about the results of her abdominal US. We discussed that it was mildly abnormal but to further characterize this, we would need an MRI of her Liver. Her US showed a 5.5 x 3.3 x 3.2 cm hypoechoic region within her liver as well as several other hypoechoic regions concerning for malignancy. She states that she had one within the last few weeks and has it on CD. She has not yet given it to Dr Linus Salmons. She was asked to bring it to our office so that we could import it to [facility]. She stated that she would bring it by. We will continue to follow this very closely

## 2016-08-23 ENCOUNTER — Telehealth: Payer: Self-pay | Admitting: Student

## 2016-08-23 ENCOUNTER — Encounter: Payer: Self-pay | Admitting: Interventional Cardiology

## 2016-08-23 DIAGNOSIS — R160 Hepatomegaly, not elsewhere classified: Secondary | ICD-10-CM

## 2016-08-23 NOTE — Telephone Encounter (Signed)
Records received by provider.  Please see most recent office note. Thurza Kwiecinski,CMA

## 2016-08-23 NOTE — Telephone Encounter (Signed)
Reviewed. MRI abdomen done at [facility]. The findings were concerning for hepatic malignancy. Lisa [name] will be referred to GI for biopsy and further management. She will be called to discuss this finding Over the phone we discussed what that her [place] and MRI found what appeared to be a mass lesion in her liver. However, without a tissue diagnosis we cannot be sure what it is. She was informed of her referral to GI and was in agreement with this. All questions were answered to the patient;s satisfaction

## 2016-08-24 ENCOUNTER — Other Ambulatory Visit: Payer: Self-pay

## 2016-08-25 ENCOUNTER — Ambulatory Visit (INDEPENDENT_AMBULATORY_CARE_PROVIDER_SITE_OTHER): Payer: No Typology Code available for payment source | Admitting: Gastroenterology

## 2016-08-25 ENCOUNTER — Encounter: Payer: Self-pay | Admitting: Gastroenterology

## 2016-08-25 VITALS — BP 118/64 | HR 100 | Ht 63.0 in | Wt 117.5 lb

## 2016-08-25 DIAGNOSIS — K7460 Unspecified cirrhosis of liver: Secondary | ICD-10-CM

## 2016-08-25 DIAGNOSIS — R160 Hepatomegaly, not elsewhere classified: Secondary | ICD-10-CM

## 2016-08-25 NOTE — Patient Instructions (Signed)
You have been scheduled for an MRI of the liver at [facility] Radiology department on 08/31/16. Your appointment time is 8:00am. Please arrive 15 minutes prior to your appointment time for registration purposes. Please make certain not to have anything to eat or drink 4 hours prior to your test. In addition, if you have any metal in your body, have a pacemaker or defibrillator, please be sure to let your ordering physician know. This test typically takes 45 minutes to 1 hour to complete.  Thank you for choosing me and [facility].  Pricilla Riffle. Dagoberto Ligas., MD., [facility]

## 2016-08-25 NOTE — Progress Notes (Signed)
History of Present Illness: This is a 60 year old female with cirrhosis and a liver mass. Hepatitis C treatment was completed in 2016 with Zepatier. Still drinking alcohol. She complains her appetite has increased however she feels she is losing weight. Has lost about 5 pounds since May. She has had a problem with ongoing cough and coughing up phlegm, particularly at night. Ultrasound ordered by Dr. Linus Salmons showed an increase in size of the liver mass. MRI was ordered but has not been completed. AFP was mildly elevated at 11.1.  Abd US IMPRESSION 08/17/16: 1. The liver has a heterogeneous parenchymal pattern with lobular contour consistent with cirrhosis. A 5.5 x 3.3 x 3.2 cm hypoechoic region is noted the anterior aspect of the liver. This is suspicious for malignancy, possibly hepatocellular carcinoma. A second ill-defined prominent hypoechoic region measuring up to 4 cm in maximum diameter noted in the central portion of the liver adjacent to the portal vein. This is in a similar location to previously identified lesion. This is also suspicious for malignancy such as hepatocellular carcinoma. Gadolinium-enhanced MRI of the liver suggested to further evaluate.  2. Echodensity noted within the portal vein. This could represent portal vein thrombosis and/or tumor. Adjacent prominent vasculature noted. This could represent cavernous transformation of the portal vein. This can also be further evaluated with MRI.  3. Mild ascites.   Liver MRI 02/23/2016 IMPRESSION: 1. The lesion of concern in the right hepatic lobe measures on average about 1.7 cm, and has a faint rim of arterial phase enhancement which persists, but central isoenhancement to the hepatic parenchyma. This is assessed as LI-RADS LR 3 (the intermediate probability for hepatocellular carcinoma) and surveillance is recommended. Consider hepatic protocol MRI with and without contrast in 3-6 months time. 2. There is also a small focus of  arterial phase enhancement in segment 8 of the liver along the periphery, which could potentially represent a vascular shunt, tiny dysplastic nodule, or tiny focus of hepatocellular carcinoma. This is about 5 mm in diameter and likewise merits observation. 3. Mild cardiomegaly with trace bilateral pleural effusions. 4. Hepatic cirrhosis. 5. Splenectomy. 6. Prominent upper abdominal and mesenteric lymph nodes are probably reactive due to the cirrhosis. These are chronic and were present back in 2004 as well.   Current Medications, Allergies, Past Medical History, Past Surgical History, Family History and Social History were reviewed in [employer] record.  Physical Exam: General: Well developed, well nourished, no acute distress Head: Normocephalic and atraumatic Eyes:  sclerae anicteric, EOMI Ears: Normal auditory acuity Mouth: No deformity or lesions Lungs: Clear throughout to auscultation Heart: Regular rate and rhythm; no murmurs, rubs or bruits Abdomen: Soft, non tender and non distended. No masses, hepatosplenomegaly or hernias noted. Normal Bowel sounds Musculoskeletal: Symmetrical with no gross deformities  Pulses:  Normal pulses noted Extremities: No clubbing, cyanosis, edema or deformities noted Neurological: Alert oriented x 4, grossly nonfocal Psychological:  Alert and cooperative. Normal mood and affect  Assessment and Recommendations:  1. 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 Suspected HCC however AFP only 11.1. Need Liver MRI to further evaluate. Pt seems to understand but is reluctant to repeat the MRI since she had one several months ago. We had a conversation about the serious concern for malignancy and the need for MR to more accurately assess. We discussed the worrisome imaging findings. She seems to understand and agrees to proceed with the MRI. I advised her to discontinue alcohol completely. I advised  her to contact  her PCP regarding her cough. Further plans to include oncology referral following the MRI.

## 2016-08-29 NOTE — Progress Notes (Signed)
Patient ID: Lisa [name], female   DOB: [birth year], 60 y.o.   MRN: [id number]     Cardiology Office Note   Date:  08/30/2016   ID:  Lisa [name], DOB [birth year], MRN [id number]  PCP:  Marina Goodell, MD    No chief complaint on file. f/u HTN   Wt Readings from Last 3 Encounters:  08/30/16 120 lb 9.6 oz (54.7 kg)  08/25/16 117 lb 8 oz (53.3 kg)  08/10/16 121 lb (54.9 kg)       History of Present Illness: Lisa [name] is a 60 y.o. female  who has had difficult to control HTN in the past. She had a stress test in the past that apparently was normal.    She has had HTN requiring meds for 5- years. More recently, BP has been better. First lisinopril / HCTZ was increased. THen Amlodipne was prescribed but was not initially started by the patient. She eventually started it and now gets BPs in the 140s at home. No chest pain. She is depressed. SHe was treated for Hep C and has cirrhosis.   She has family stress. She has stress from bills As well. She does not sleep well at night. She has decreased adding salt to food regularly, still, despite being instructed to stop.   Diffuse ache in abdomen has persisted.  Some nausea.   She does not walk regularly. She does have to walk to the bus stop. SHe has foot pain and other joint pains, particularly the knees, that limit her. She feels achy the day after walking so she does not want to walk more.  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  She does not like compression socks.  She still smokes and drinks. She has cut back on both. She has not quit since last visit.  She was seen in the ER for chest pain.  No exertional chest pain.  Rare discomfort in the chest which is short lived and relieved spontaneously.   Cocaine noted in her urine at admission in 11/17.  Enzymes, BNP negative.  Her biggest complaint is that she aches in her legs constantly.  She is decreasing cigarettes.        Past Medical History:  Diagnosis  Date  . Alcohol abuse, daily use   . Cirrhosis of liver ([place])   . Helicobacter pylori gastritis   . Hepatitis C   . HTN (hypertension)   . Hx of cardiovascular stress test    Lexiscan Myoview 3/16: No ischemia, EF 66%, normal study  . Leg pain    ABIs 3/16:  normal   . Mitral valve prolapse   . Osteopetrosis   . Poor dental hygiene   . Tobacco use     Past Surgical History:  Procedure Laterality Date  . ABDOMINAL HYSTERECTOMY    . CARPAL TUNNEL RELEASE    . LAPAROSCOPY    . rod in right arm       Current Outpatient Prescriptions  Medication Sig Dispense Refill  . amLODipine (NORVASC) 10 MG tablet Take 1 tablet (10 mg total) by mouth daily. 90 tablet 2  . aspirin 81 MG tablet Take 81 mg by mouth daily.    . ferrous sulfate 325 (65 FE) MG tablet Take 1 tablet (325 mg total) by mouth daily with breakfast. 90 tablet 3  . folic acid (FOLVITE) 1 MG tablet Take 1 tablet (1 mg total) by mouth daily. (Patient taking differently: Take 1 mg by mouth daily. PT  NOT SURE IF SHE IS TAKING) 60 tablet 0  . hydrochlorothiazide (HYDRODIURIL) 25 MG tablet Take 1 tablet (25 mg total) by mouth daily. 90 tablet 2  . hydrOXYzine (ATARAX/VISTARIL) 10 MG tablet Take 10 mg by mouth 3 (three) times daily as needed for anxiety (or sleep).     Marland Kitchen lisinopril (PRINIVIL,ZESTRIL) 20 MG tablet Take 1 tablet (20 mg total) by mouth daily. 90 tablet 2  . mometasone-formoterol (DULERA) 200-5 MCG/ACT AERO Inhale 1 puff into the lungs 2 (two) times daily. 1 Inhaler 0  . naphazoline-pheniramine (NAPHCON-A) 0.025-0.3 % ophthalmic solution Place 1 drop into both eyes every 4 (four) hours as needed for irritation. 15 mL 1  . Thiamine HCl (VITAMIN B-1) 250 MG tablet Take 1 tablet (250 mg total) by mouth daily. 30 tablet 4  . traMADol (ULTRAM) 50 MG tablet Take 1 tablet (50 mg total) by mouth every 12 (twelve) hours as needed for severe pain. (Patient taking differently: Take 100-150 mg by mouth every 12 (twelve) hours as  needed for severe pain. ) 30 tablet 0  . vitamin B-12 (CYANOCOBALAMIN) 250 MCG tablet Take 1 tablet (250 mcg total) by mouth daily. 30 tablet 4   No current facility-administered medications for this visit.     Allergies:   Celecoxib and Sulfonamide derivatives    Social History:  The patient  reports that she has been smoking Cigarettes.  She started smoking about 46 years ago. She has a 23.00 pack-year smoking history. She has never used smokeless tobacco. She reports that she drinks about 25.2 oz of alcohol per week . She reports that she uses drugs, including Marijuana.   Family History:  The patient's family history includes Cancer in her father; Heart disease in her mother and sister; Lupus in her sister; Stroke in her sister.    ROS:  Please see the history of present illness.   Otherwise, review of systems are positive for intermittent abdominal pain.   All other systems are reviewed and negative.    PHYSICAL EXAM: VS:  BP (!) 110/50 (BP Location: Right Arm, Patient Position: Sitting, Cuff Size: Normal)   Pulse 80   Ht 5\' 3"  (1.6 m)   Wt 120 lb 9.6 oz (54.7 kg)   BMI 21.36 kg/m  , BMI Body mass index is 21.36 kg/m. GEN: Well nourished, well developed, in no acute distress  HEENT: normal  Neck: no JVD, carotid bruits, or masses Cardiac: RRR; no murmurs, rubs, or gallops,no edema  Respiratory:  clear to auscultation bilaterally, normal work of breathing GI: soft, nontender, nondistended, + BS MS: no deformity or atrophy  Skin: warm and dry, no rash Neuro:  Strength and sensation are intact Psych: euthymic mood, full affect   EKG:   The ekg ordered today demonstrates normal sinus rhythm, no ST segment changes   Recent Labs: 07/28/2016: B Natriuretic Peptide 106.4 07/29/2016: ALT 24; BUN 5; Creatinine, Ser 0.54; Hemoglobin 12.7; Platelets 259; Potassium 3.1; Sodium 135   Lipid Panel    Component Value Date/Time   CHOL 152 04/24/2014 0953   TRIG 88 04/24/2014 0953    HDL 46 04/24/2014 0953   CHOLHDL 3.3 04/24/2014 0953   VLDL 18 04/24/2014 0953   LDLCALC 88 04/24/2014 0953   LDLDIRECT 94 01/24/2013 1501     Other studies Reviewed: Additional studies/ records that were reviewed today with results demonstrating: normal stress test in March 2016.   ASSESSMENT AND PLAN:  1. HTN: Now on Amlodipine 10 mg daily.  Blood pressure much better. Continue to follow while outside the doctor's office. She needs to decrease salt intake and decrease alcohol intake. This should help with blood pressure. She will follow-up with her primary care physician.  She will f/u here if she has any problems with BP. 2. Chest pain: Some atypical chest pain- this has been a chronic issue.  She was hospitalized in 11/17.  I stressed the importance of avoiding cocaine and cigarettes.  We talked about cocaine induced coronary spasm.  Normal LV function.  Negative stress test in 2016.  No further ischemic testing at this time.  If she has exertional chest pain, she will let us know and we would consider an angiogram. 3. Tobacco abuse: She needs to stop smoking. This was stressed to the patient. 4. Abdominal pain: Known liver disease. She has follow-up with her regular doctor soon.   5. Mild mitral regurgitation:  No signs of CHF on exam. Known LVH.   Current medicines are reviewed at length with the patient today.  The patient concerns regarding her medicines were addressed.  The following changes have been made:  No change  Labs/ tests ordered today include:  No orders of the defined types were placed in this encounter.   Recommend 150 minutes/week of aerobic exercise Low fat, low carb, high fiber diet recommended  Disposition:   FU in 1 year as scheduled   Signed, Larae Grooms, MD  08/30/2016 12:32 PM    [facility] [address], [place], [place]  [address] Phone: [phone]; Fax: [phone]

## 2016-08-30 ENCOUNTER — Ambulatory Visit (INDEPENDENT_AMBULATORY_CARE_PROVIDER_SITE_OTHER): Payer: No Typology Code available for payment source | Admitting: Interventional Cardiology

## 2016-08-30 ENCOUNTER — Encounter: Payer: Self-pay | Admitting: Interventional Cardiology

## 2016-08-30 VITALS — BP 110/50 | HR 80 | Ht 63.0 in | Wt 120.6 lb

## 2016-08-30 DIAGNOSIS — I340 Nonrheumatic mitral (valve) insufficiency: Secondary | ICD-10-CM

## 2016-08-30 DIAGNOSIS — I10 Essential (primary) hypertension: Secondary | ICD-10-CM

## 2016-08-30 DIAGNOSIS — R0789 Other chest pain: Secondary | ICD-10-CM

## 2016-08-30 DIAGNOSIS — Z720 Tobacco use: Secondary | ICD-10-CM

## 2016-08-30 MED ORDER — LISINOPRIL 20 MG PO TABS: 20.0000 mg | ORAL_TABLET | Freq: Every day | ORAL | 2 refills | Status: DC

## 2016-08-30 MED ORDER — AMLODIPINE BESYLATE 10 MG PO TABS: 10.0000 mg | ORAL_TABLET | Freq: Every day | ORAL | 2 refills | Status: AC

## 2016-08-30 MED ORDER — HYDROCHLOROTHIAZIDE 25 MG PO TABS
25.0000 mg | ORAL_TABLET | Freq: Every day | ORAL | 2 refills | Status: AC
Start: 2016-08-30 — End: ?

## 2016-08-30 NOTE — Patient Instructions (Signed)
**Note De-identified  Obfuscation** Medication Instructions:  Same-no changes  Labwork: None  Testing/Procedures: None  Follow-Up: As previously planned      If you need a refill on your cardiac medications before your next appointment, please call your pharmacy.   

## 2016-08-31 ENCOUNTER — Ambulatory Visit (HOSPITAL_COMMUNITY)
Admission: RE | Admit: 2016-08-31 | Discharge: 2016-08-31 | Disposition: A | Discharge status: Home / Self Care | Payer: No Typology Code available for payment source | Source: Ambulatory Visit | Attending: Gastroenterology | Admitting: Gastroenterology

## 2016-08-31 DIAGNOSIS — Z9081 Acquired absence of spleen: Secondary | ICD-10-CM | POA: Insufficient documentation

## 2016-08-31 DIAGNOSIS — R188 Other ascites: Secondary | ICD-10-CM | POA: Insufficient documentation

## 2016-08-31 DIAGNOSIS — R160 Hepatomegaly, not elsewhere classified: Secondary | ICD-10-CM | POA: Insufficient documentation

## 2016-08-31 MED ORDER — GADOBENATE DIMEGLUMINE 529 MG/ML IV SOLN
15.0000 mL | Freq: Once | INTRAVENOUS | Status: AC | PRN
  Administered 2016-08-31: 11 mL via INTRAVENOUS

## 2016-09-01 ENCOUNTER — Other Ambulatory Visit: Payer: No Typology Code available for payment source

## 2016-09-01 ENCOUNTER — Other Ambulatory Visit: Payer: Self-pay

## 2016-09-01 DIAGNOSIS — C220 Liver cell carcinoma: Secondary | ICD-10-CM

## 2016-09-01 DIAGNOSIS — K7469 Other cirrhosis of liver: Secondary | ICD-10-CM

## 2016-09-01 DIAGNOSIS — K7030 Alcoholic cirrhosis of liver without ascites: Secondary | ICD-10-CM

## 2016-09-02 ENCOUNTER — Telehealth: Payer: Self-pay

## 2016-09-02 LAB — AFP TUMOR MARKER: AFP TUMOR MARKER: 15 ng/mL — AB (ref <6.1)

## 2016-09-02 NOTE — Telephone Encounter (Signed)
Pt is returning a call she received from [facility]. No note seen in chart. HIM denied attempting to call pt. She has been referred to [facility].

## 2016-09-05 ENCOUNTER — Telehealth: Payer: Self-pay | Admitting: *Deleted

## 2016-09-05 NOTE — Telephone Encounter (Signed)
Oncology Nurse Navigator Documentation  Oncology Nurse Navigator Flowsheets 09/05/2016  Navigator Location [facility]  Referral date to RadOnc/MedOnc 09/01/2016  Navigator Encounter Type Introductory phone call  Abnormal Finding Date 07/29/2016  Spoke with patient and provided new patient appointment for 09/09/16 in [facility] [place] with Dr. Burr Medico at [phone]/1030. Informed of location of [facility] [place], valet service, and registration process. Reminded to bring photo ID, orange card and a current medication list, including supplements. Patient verbalizes understanding. Notified HIM.

## 2016-09-09 ENCOUNTER — Telehealth: Payer: Self-pay | Admitting: Hematology

## 2016-09-09 ENCOUNTER — Encounter: Payer: Self-pay | Admitting: *Deleted

## 2016-09-09 ENCOUNTER — Encounter: Payer: Self-pay | Admitting: Hematology

## 2016-09-09 ENCOUNTER — Ambulatory Visit (HOSPITAL_BASED_OUTPATIENT_CLINIC_OR_DEPARTMENT_OTHER): Payer: No Typology Code available for payment source | Admitting: Hematology

## 2016-09-09 ENCOUNTER — Encounter: Payer: No Typology Code available for payment source | Admitting: Nutrition

## 2016-09-09 ENCOUNTER — Ambulatory Visit: Payer: No Typology Code available for payment source | Attending: Hematology | Admitting: Physical Therapy

## 2016-09-09 VITALS — BP 141/57 | HR 78 | Temp 97.6°F | Resp 18 | Ht 63.0 in | Wt 120.4 lb

## 2016-09-09 DIAGNOSIS — K7460 Unspecified cirrhosis of liver: Secondary | ICD-10-CM

## 2016-09-09 DIAGNOSIS — F1910 Other psychoactive substance abuse, uncomplicated: Secondary | ICD-10-CM

## 2016-09-09 DIAGNOSIS — C220 Liver cell carcinoma: Secondary | ICD-10-CM

## 2016-09-09 DIAGNOSIS — M6281 Muscle weakness (generalized): Secondary | ICD-10-CM | POA: Insufficient documentation

## 2016-09-09 NOTE — Progress Notes (Signed)
[place]  Telephone:([phone]) [phone] Fax:([phone]) [place] Note   Patient Care Team: Veatrice Bourbon, MD as PCP - General (Family Medicine) 09/09/2016  CHIEF COMPLAINTS/PURPOSE OF CONSULTATION:  Recently diagnosed hepatocellular carcinoma  Oncology History   Hepatocellular carcinoma ([place])   Staging form: Liver (Excluding Intrahepatic Bile Ducts), AJCC 7th Edition   - Clinical stage from 08/31/2016: Stage II (T2, N0, M0) - Signed by Truitt Merle, MD on 09/09/2016      Hepatocellular carcinoma ([place])   07/29/2016 Tumor Marker    AFP 11.1      08/31/2016 Initial Diagnosis    Hepatocellular carcinoma ([place])      08/31/2016 Imaging    Liver MRI w wo contrast showed progressive multifocal hypervascular tumor within the anterior right hepatic lobe, consistent with [place]. 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 Stable nonspecific prominent lymph nodes within the gastrohepatic ligament and upper retroperitoneum. Mildly increased ascites.         HISTORY OF PRESENTING ILLNESS:  L[place] [name] 60 y.o. female is here because of her recently diagnosed hepatocellular carcinoma. She is accompanied by her daughter and granddaughter to my clinic today.  She was found to have Hep C in 2006 when she was incarcerated, and she finally received Hep C treatment and was successful last year under the care of Dr. Linus Salmons.  She was found to have liver cirrhosis last year, no history of mucositis, GI bleeding, or other complications. She does not have a gastroenterologist.  During her routine follow-up, her abdominal ultrasound in May 2017 showed cirrhotic change, hypoechoic masslike region in the right lobe measuring 2.3 cm. She underwent liver MRI in May 2017, which showed a 1.7 cm lesion in the right hepatic lobe, with faint rim of arterial phase enhancement, with intermediate probability for [facility]. 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  She has had RUQ pain for the past year, worse lately, she also complains of fatigue, sleeps a lot, able to function at home. She has good appetite, she has gained 5 lbs lately she lvies with her daughter now.   MEDICAL HISTORY:  Past Medical History:  Diagnosis Date  . Alcohol abuse, daily use   . Cirrhosis of liver ([place])   . Helicobacter pylori gastritis   . Hepatitis C   . HTN (hypertension)   . Hx of cardiovascular stress test    Lexiscan Myoview 3/16: No ischemia, EF 66%, normal study  . Leg pain    ABIs 3/16:  normal   . Mitral valve prolapse   . Osteopetrosis   . Poor dental hygiene   . Tobacco use     SURGICAL HISTORY: Past Surgical History:  Procedure Laterality Date  . ABDOMINAL HYSTERECTOMY    . CARPAL TUNNEL RELEASE    . LAPAROSCOPY    . rod in right arm      SOCIAL HISTORY: Social History   Social History  . Marital status: Divorced    Spouse name: N/A  . Number of children: 1  . Years of education: N/A   Occupational History  . personal care asst [employer] History Main Topics  . Smoking status: Current Every Day Smoker    Packs/day: 0.50    Years: 46.00    Types: Cigarettes    Start date: 09/26/1969  . Smokeless tobacco:  Never Used     Comment: has cut back about 1/2 pk per day  . Alcohol use 25.2 oz/week    42 Standard drinks or equivalent per week     Comment: 40 oz beer a day   . Drug use:     Types: Marijuana, Cocaine     Comment: Still using marijuana.  Past history of IV drug use  . Sexual activity: Not Currently   Other Topics Concern  . Not on file   Social History Narrative   Lives alone in [place] in an apartment. 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 No current source of income. Goes to [employer] for help as needed.   One daughter, age 68. Six grandchildren,  one great-grand child.     Rides bus for transportation    FAMILY HISTORY: Family History  Problem Relation Age of Onset  . Cancer Father     Prostate  . Heart disease Mother   . Stroke Sister   . Heart disease Sister   . Lupus Sister   . Heart attack Neg Hx   . Colon cancer Neg Hx   . Colon polyps Neg Hx     ALLERGIES:  is allergic to celecoxib and sulfonamide derivatives.  MEDICATIONS:  Current Outpatient Prescriptions  Medication Sig Dispense Refill  . amLODipine (NORVASC) 10 MG tablet Take 1 tablet (10 mg total) by mouth daily. 90 tablet 2  . aspirin 81 MG tablet Take 81 mg by mouth daily.    . ferrous sulfate 325 (65 FE) MG tablet Take 1 tablet (325 mg total) by mouth daily with breakfast. 90 tablet 3  . folic acid (FOLVITE) 1 MG tablet Take 1 tablet (1 mg total) by mouth daily. (Patient taking differently: Take 1 mg by mouth daily. PT NOT SURE IF SHE IS TAKING) 60 tablet 0  . hydrochlorothiazide (HYDRODIURIL) 25 MG tablet Take 1 tablet (25 mg total) by mouth daily. 90 tablet 2  . hydrOXYzine (ATARAX/VISTARIL) 10 MG tablet Take 10 mg by mouth 3 (three) times daily as needed for anxiety (or sleep).     Marland Kitchen lisinopril (PRINIVIL,ZESTRIL) 20 MG tablet Take 1 tablet (20 mg total) by mouth daily. 90 tablet 2  . mometasone-formoterol (DULERA) 200-5 MCG/ACT AERO Inhale 1 puff into the lungs 2 (two) times daily. 1 Inhaler 0  . naphazoline-pheniramine (NAPHCON-A) 0.025-0.3 % ophthalmic solution Place 1 drop into both eyes every 4 (four) hours as needed for irritation. 15 mL 1  . vitamin B-12 (CYANOCOBALAMIN) 250 MCG tablet Take 1 tablet (250 mcg total) by mouth daily. 30 tablet 4  . Thiamine HCl (VITAMIN B-1) 250 MG tablet Take 1 tablet (250 mg total) by mouth daily. (Patient not taking: Reported on 09/09/2016) 30 tablet 4   No current facility-administered medications for this visit.     REVIEW OF SYSTEMS:   Constitutional: Denies fevers, chills or abnormal night sweats Eyes: Denies  blurriness of vision, double vision or watery eyes Ears, nose, mouth, throat, and face: Denies mucositis or sore throat Respiratory: Denies cough, dyspnea or wheezes Cardiovascular: Denies palpitation, chest discomfort or lower extremity swelling Gastrointestinal:  Denies nausea, heartburn or change in bowel habits Skin: Denies abnormal skin rashes Lymphatics: Denies new lymphadenopathy or easy bruising Neurological:Denies numbness, tingling or new weaknesses Behavioral/Psych: Mood is stable, no new changes  All other systems were reviewed with the patient and are negative.  PHYSICAL EXAMINATION: ECOG PERFORMANCE STATUS: 1 - Symptomatic but completely ambulatory  Vitals:   09/09/16 1052  BP: (!) 141/57  Pulse: 78  Resp: 18  Temp: 97.6 F (36.4 C)   Filed Weights   09/09/16 1052  Weight: 120 lb 6.4 oz (54.6 kg)    GENERAL:alert, no distress and comfortable SKIN: skin color, texture, turgor are normal, no rashes or significant lesions EYES: normal, conjunctiva are pink and non-injected, sclera clear OROPHARYNX:no exudate, no erythema and lips, buccal mucosa, and tongue normal  NECK: supple, thyroid normal size, non-tender, without nodularity LYMPH:  no palpable lymphadenopathy in the cervical, axillary or inguinal LUNGS: clear to auscultation and percussion with normal breathing effort HEART: regular rate & rhythm and no murmurs and no lower extremity edema ABDOMEN:abdomen soft, mild tenderness at RUQ, (+) palpable liver, 2-3cm below rib cage, mild tender, firm, No other palpable abdominal mass, normal bowel sounds Musculoskeletal:no cyanosis of digits and no clubbing  PSYCH: alert & oriented x 3 with fluent speech NEURO: no focal motor/sensory deficits  LABORATORY DATA:  I have reviewed the data as listed CBC Latest Ref Rng & Units 07/29/2016 07/28/2016 03/30/2016  WBC 4.0 - 10.5 K/uL 6.5 4.8 5.6  Hemoglobin 12.0 - 15.0 g/dL 12.7 12.3 12.7  Hematocrit 36.0 - 46.0 % 37.0  35.9(L) 37.4  Platelets 150 - 400 K/uL 259 277 322   CMP Latest Ref Rng & Units 07/29/2016 07/28/2016 03/30/2016  Glucose 65 - 99 mg/dL 101(H) 78 77  BUN 6 - 20 mg/dL 5(L) <5(L) <5(L)  Creatinine 0.44 - 1.00 mg/dL 0.54 0.59 0.55  Sodium 135 - 145 mmol/L 135 139 138  Potassium 3.5 - 5.1 mmol/L 3.1(L) 3.7 3.7  Chloride 101 - 111 mmol/L 102 108 106  CO2 22 - 32 mmol/L 27 24 25   Calcium 8.9 - 10.3 mg/dL 8.8(L) 8.7(L) 8.5(L)  Total Protein 6.5 - 8.1 g/dL 7.4 8.0 8.7(H)  Total Bilirubin 0.3 - 1.2 mg/dL 1.5(H) 1.3(H) 0.5  Alkaline Phos 38 - 126 U/L 89 91 89  AST 15 - 41 U/L 66(H) 73(H) 87(H)  ALT 14 - 54 U/L 24 27 29    AFP 07/29/2016: 11.1 09/01/2016: 15   PATHOLOGY REPORT  None   RADIOGRAPHIC STUDIES: I have personally reviewed the radiological images as listed and agreed with the findings in the report. Mr Liver W Wo Contrast  Result Date: 08/31/2016 CLINICAL DATA:  Chronic hepatitis-C. Elevated alpha-fetoprotein levels. Follow-up liver lesion. EXAM: MRI ABDOMEN WITHOUT AND WITH CONTRAST TECHNIQUE: Multiplanar multisequence MR imaging of the abdomen was performed both before and after the administration of intravenous contrast. CONTRAST:  69mL MULTIHANCE GADOBENATE DIMEGLUMINE 529 MG/ML IV SOLN COMPARISON:  MRI 02/23/2016.  Ultrasound 08/17/2016. FINDINGS: Despite efforts by the technologist and patient, motion artifact is present on today's exam and could not be eliminated. This reduces exam sensitivity and specificity. Lower chest: Trace pleural fluid on the right. The visualized lower chest otherwise appears unremarkable. Hepatobiliary: Again demonstrated are morphologic changes of cirrhosis. The central lesion previously demonstrated within the right hepatic lobe has enlarged, now measuring 3.8 x 2.1 cm (image 24 of series 3). There is progressive multifocal tumor throughout segment 8 with a peripheral component measuring up to 5.0 x 3.2 cm on image 28 of series 3. This tumor demonstrates T2  hyperintensity and heterogeneous enhancement following contrast. 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 No involvement of the left hepatic lobe seen. The gallbladder and biliary system appear unremarkable. Pancreas: Unremarkable. No pancreatic ductal dilatation or surrounding inflammatory changes. Spleen: Prior  splenectomy. Adrenals/Urinary Tract: Both adrenal glands appear normal. No suspicious renal findings. Probable small cysts bilaterally, stable. Stomach/Bowel: No evidence of bowel wall thickening, distention or surrounding inflammatory change. Vascular/Lymphatic: Several prominent lymph nodes within the gastrohepatic ligament and upper retroperitoneum are again noted, similar to the previous study. These measure up to 10 mm on image 38 of series 901. As above, probable tumor thrombus in the right portal vein. Central mesenteric varices are noted. Other: Mildly increased ascites without visible peritoneal nodularity. Musculoskeletal: No acute or significant osseous findings. IMPRESSION: 1. Progressive multifocal hypervascular tumor within the anterior right hepatic lobe (segment 8) consistent with hepatocellular carcinoma. 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 2. Stable nonspecific prominent lymph nodes within the gastrohepatic ligament and upper retroperitoneum. 3. Mildly increased ascites without demonstrated peritoneal nodularity. Electronically Signed   By: Richardean Sale M.D.   On: 08/31/2016 11:37   US Abdomen Limited Ruq  Result Date: 08/17/2016 CLINICAL DATA:  Chronic hepatitis-C.  Elevated AFP. EXAM: US ABDOMEN LIMITED - RIGHT UPPER QUADRANT COMPARISON:  MRI 02/23/2016. FINDINGS: Gallbladder: No gallstones or wall thickening visualized. No sonographic Murphy sign noted by sonographer. Common bile duct: Diameter: 5.3 mm Liver: The liver has a heterogeneous parenchymal pattern with lobular contour consistent with cirrhosis. A  5.5 x 3.3 x 3.2 cm hypoechoic region is noted in the anterior aspect of the liver. This is suspicious for a malignancy, possibly hepatocellular carcinoma. An ill-defined prominent hypoechoic region measuring up to 4 cm in maximum diameter noted in the central portion of the liver adjacent to the portal vein. This is in a similar location to previously identified lesion. This is also suspicious for malignancy such as hepatocellular carcinoma . Gadolinium-enhanced MRI of the liver is suggested to further evaluate. Echodensity noted within the portal vein. This could represent portal vein thrombosis and/or tumor. Adjacent prominent vasculature noted. This could represent cavernous transformation of the portal vein. This can also be further evaluated with MRI. Mild ascites noted. IMPRESSION: 1. The liver has a heterogeneous parenchymal pattern with lobular contour consistent with cirrhosis. A 5.5 x 3.3 x 3.2 cm hypoechoic region is noted the anterior aspect of the liver. This is suspicious for malignancy, possibly hepatocellular carcinoma. A second ill-defined prominent hypoechoic region measuring up to 4 cm in maximum diameter noted in the central portion of the liver adjacent to the portal vein. This is in a similar location to previously identified lesion. This is also suspicious for malignancy such as hepatocellular carcinoma. Gadolinium-enhanced MRI of the liver suggested to further evaluate. 2. Echodensity noted within the portal vein. This could represent portal vein thrombosis and/or tumor. Adjacent prominent vasculature noted. This could represent cavernous transformation of the portal vein. This can also be further evaluated with MRI. 3. Mild ascites. Electronically Signed   By: Marcello Moores  Register   On: 08/17/2016 08:41    ASSESSMENT & PLAN:  60 year old African-American female, with past medical history of polysubstance abuse, hepatitis C, liver cirrhosis, presented with right abdominal pain, MRI showed  multifocal right lobe liver masses.  1. Hepatocellular carcinoma, multifocal, T2N0Mx, stage II  -I reviewed her imaging findings, laboratory results including AFP, and her diagnosis and staging. -Her MRI findings is consistent with multifocal hepatocellular carcinoma, giving her underlying hepatitis C, liver cirrhosis, elevated AFP, I think her diagnosis is quite certain. I don't think he needs needle biopsied  to confirm the diagnosis. I will obtain a CT chest and a bone scan to rule out distant metastasis. -I will present her  case in our GI tumor board next week   We did discuss the treatment option of liver transplant versus liver targeted therapy for his [place]. Giving the multifocal disease, and her size of the tumor, motor substance abuse, she is probably not a candidate for surgical resection or liver transplant. Patient is not a very interesting surgery also.  She would be a good candidate for liver targeted therapy if staging scan shows no distant deficits. 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   We also discussed the systemic therapy options, which includes sorafenib, immunotherapy with Nivolumab, or chemotherapy.  In general, [place] is not very sensitive to chemotherapy. If liver targeted therapy is not an option, I would offer her Nivolumab as first-line therapy, due to the good tolerance in general.   2. Multiple substance abuse -Smoking, alcohol, cocaine and marijuana -I strongly encouraged her to stop the above, however she is not very interested. She states she self-medicate with those substance due to her pain and low appetite.  3. Abdominal pain -Probably related to her liver cancer. -Due to her multiple substance abuse, I strongly encouraged her to see a pain specialist. I would be happy to refer her, she  declined. -I will not prescribe narcotic pain medication or BZ   4. HTN -f/u with PCP  Plan -Chest CT without contrast -2 board discussion next week -I'll see him back in 3 weeks and finalize his treatment plan.   All questions were answered. The patient knows to call the clinic with any problems, questions or concerns. I spent 55 minutes counseling the patient face to face. The total time spent in the appointment was 60 minutes and more than 50% was on counseling.     Truitt Merle, MD 09/11/2016 12:03 PM

## 2016-09-09 NOTE — Therapy (Signed)
[facility] [address] [name], [place], [address] Phone: [phone]   Fax:  [phone]  Physical Therapy Evaluation  Patient Details  Name: [place] [name] MRN: [id number] Date of Birth: 01-19-1956 Referring Provider: Dr. Burr Medico  Encounter Date: 09/09/2016      PT End of Session - 09/09/16 1235    Visit Number 1   PT Start Time 1210   PT Stop Time 1233   PT Time Calculation (min) 23 min   Activity Tolerance Patient tolerated treatment well;Patient limited by fatigue   Behavior During Therapy [name] for tasks assessed/performed      Past Medical History:  Diagnosis Date  . Alcohol abuse, daily use   . Cirrhosis of liver ([place])   . Helicobacter pylori gastritis   . Hepatitis C   . HTN (hypertension)   . Hx of cardiovascular stress test    Lexiscan Myoview 3/16: No ischemia, EF 66%, normal study  . Leg pain    ABIs 3/16:  normal   . Mitral valve prolapse   . Osteopetrosis   . Poor dental hygiene   . Tobacco use     Past Surgical History:  Procedure Laterality Date  . ABDOMINAL HYSTERECTOMY    . CARPAL TUNNEL RELEASE    . LAPAROSCOPY    . rod in right arm      There were no vitals filed for this visit.       Subjective Assessment - 09/09/16 1158    Subjective attending GI clinic to see oncology team   Patient is accompained by: Family member  daughter and granddaughter   Patient Stated Goals education   Currently in Pain? Yes   Pain Score 6    Pain Location Abdomen   Pain Orientation Right   Pain Descriptors / Indicators Aching   Pain Type Chronic pain   Pain Onset More than a month ago   Pain Frequency Constant   Aggravating Factors  movement   Pain Relieving Factors rest   Multiple Pain Sites No            [facility] PT Assessment - 09/09/16 0001      Assessment   Medical Diagnosis HCC Per MRI of liver   Referring Provider Dr. Burr Medico   Onset Date/Surgical Date 08/31/16   Prior  Therapy non     Precautions   Precautions Other (comment)  cancer     Restrictions   Weight Bearing Restrictions No     Balance Screen   Has the patient fallen in the past 6 months Yes   How many times? 2  may be due to alcohol and narcotics   Has the patient had a decrease in activity level because of a fear of falling?  No   Is the patient reluctant to leave their home because of a fear of falling?  No     Home Environment   Living Environment Private residence   Living Arrangements Children   Type of Home Apartment     Prior Function   Level of Independence Independent     Cognition   Overall Cognitive Status Within Functional Limits for tasks assessed     Observation/Other Assessments   Focus on Therapeutic Outcomes (FOTO)  therapist discretion is limited 20% due to shortness of breath and fatique     Posture/Postural Control   Posture/Postural Control No significant limitations     ROM / Strength   AROM / PROM / Strength AROM;Strength  AROM   Overall AROM  Within functional limits for tasks performed     Strength   Overall Strength Comments bil. lower extremities is 4/5     Transfers   Transfers Not assessed     Ambulation/Gait   Ambulation/Gait No                           PT Education - 09/09/16 1206    Education provided Yes   Education Details conserving energy, walking program, hip strengthening, trunk strengthening, balance exercises   Person(s) Educated Patient   Methods Explanation;Demonstration;Verbal cues;Handout   Comprehension Returned demonstration;Verbalized understanding             PT Long Term Goals - 09/09/16 1210      PT LONG TERM GOAL #1   Title understand how to start a walking program and strengthening program to maintain her endurance during treatment   Time 1   Period Days   Status Achieved     PT LONG TERM GOAL #2   Title understand how to conserve energy during her treatments   Time 1    Period Days   Status Achieved               Plan - 09/09/16 1207    Clinical Impression Statement Patient is a 60 year old female with diagnosis of [place] per MRI of liver on 08/31/2016.  Patient is attending GI clinic to see the oncology team.  Patient reports constant pain at level 6/10 and increases with movement. Patient reports she fatiques easily.  Bilateral lower extremity strength is 4/5. 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 Patient reports she walks to the mailbox and gets tired. Patient reports she has falled 2 times.  Patient reports she drinks 40 oz. beer, smokes marajuana and has cocaine which could contribute to her balance issues. Patient is low complexity due to pain evolving and no comorbidities that will impact her care.  Patient will benefit from skilled therapy to be educated to improve her strength, understand how to start a walking program and how to conserve energy.    Rehab Potential Good   Clinical Impairments Affecting Rehab Potential cocaine and marjuana use   PT Frequency 1x / week   PT Duration Other (comment)  1 time session in GI clinic   PT Treatment/Interventions Therapeutic activities;Therapeutic exercise;Balance training;Patient/family education   PT Next Visit Plan Discharge to HEP   PT Home Exercise Plan Current HEP   Recommended Other Services none   Consulted and Agree with Plan of Care Patient      Patient will benefit from skilled therapeutic intervention in order to improve the following deficits and impairments:  Pain, Decreased strength  Visit Diagnosis: Muscle weakness (generalized) - Plan: PT plan of care cert/re-cert     Problem List Patient Active Problem List   Diagnosis Date Noted  . Hepatocellular carcinoma ([place]) 09/09/2016  . Cardiovascular disease 08/03/2016  . Health care maintenance 08/03/2016  . Aortic atherosclerosis ([place]) 07/28/2016  . Polysubstance abuse 07/28/2016  . Liver mass 06/10/2016   . Cirrhosis of liver without ascites ([place]) 05/26/2015  . Aching pain 03/10/2015  . Mitral regurgitation 02/16/2015  . Anemia 12/29/2014  . Abdominal pain 12/23/2014  . Thigh pain 12/10/2014  . Productive cough 12/10/2014  . Allergic rhinitis 11/07/2014  . Tobacco abuse 07/24/2014  . Heart murmur 07/24/2014  . Neuropathic pain 07/07/2014  .  Hypertensive urgency 05/21/2014  . Back pain 05/21/2014  . Chest pain 03/31/2014  . Alcohol abuse 08/19/2013  . Anxiety state, unspecified 08/19/2013  . Depression 01/24/2013  . Osteopetrosis   . Chronic hepatitis C without hepatic coma ([place])   . HYPERTENSION, BENIGN 03/18/2010    Earlie Counts, PT 09/09/16 12:42 PM   [facility] [address] [place], [place], [address] Phone: [phone]   Fax:  [phone]  Name: Normagene Monasterio MRN: [id number] Date of Birth: [birth year]

## 2016-09-09 NOTE — Patient Instructions (Signed)
Care Plan Summary- 09/09/2016 Name: [name]     DOB:  1955-10-15 Your Medical Team: Medical Oncologist:  Dr. Truitt Merle Radiation Oncologist:   Surgeon:    Type of Cancer: Hepatocellular Cancer (HCC)-Liver Cancer Stage/Grade:  *Exact staging of your cancer is based on size of the tumor, depth of invasion, involvement of lymph nodes or not, and whether or not the cancer has spread beyond the primary site   Recommendations: Based on information available as of today's consult. Recommendations may change depending on the results of further tests or exams. 1) Potential for targeted liver therapy per interventional radiology 2) CT scan without contrast 3) Return to Dr. Burr Medico in 3 weeks Next Steps: 1) Your case will be presented at tumor board on 12/20 and you will be called with result 2) Schedule follow up with Dr. Burr Medico 3) Questions? Merceda Elks, RN, BSN at [phone]. [name] is your Oncology Nurse Navigator and is available to assist you while you're receiving your medical care at [facility].

## 2016-09-09 NOTE — Progress Notes (Signed)
Oncology Nurse Navigator Documentation  Oncology Nurse Navigator Flowsheets 09/09/2016  Navigator Location [facility]  Referral date to RadOnc/MedOnc -  Navigator Encounter Type Clinic/MDC;Telephone  Telephone Outgoing Call;Appt Confirmation/Clarification  Abnormal Finding Date -  Confirmed Diagnosis Date 08/31/2016  Multidisiplinary Clinic Date 09/09/2016  Patient Visit Type MedOnc  Treatment Phase Pre-Tx/Tx Discussion  Barriers/Navigation Needs Coordination of Care;Education;Financial  Education Contractor;Newly Diagnosed Cancer Education;Concerns with Finances/ Eligibility  Interventions Education;Coordination of Care--added to GI Conference for 12/20  Coordination of Care Radiology--CT chest 12/20 at 1015/1030 at [facility]--patient and her daughter notified via phone after clinic was over  Education Method Verbal;Written  Support Groups/Services [place] Manager;GI Support Group--was seen by CSW today  Acuity Level 2  Time Spent with Patient 24  Met with patient and her daughter during GI [place] patient visit. Explained the role of the GI Nurse Navigator and provided New Patient Packet with information on: 1. Liver cancer--anatomy of liver, AFP test information and significance 2. Support groups 3. Advanced Directives 4. Fall Safety Plan Answered questions, reviewed current treatment plan using TEACH back and provided emotional support. Provided copy of current treatment plan. She understands that her case will be presented in 12/20 GI Conference and navigator will call her with discussion results.   Merceda Elks, RN, BSN GI Oncology [place]

## 2016-09-09 NOTE — Progress Notes (Signed)
[place] Psychosocial Distress Screening Clinical Social Work  Clinical Social Work met with pt, her daughter and niece at [facility] to introduce self, explain role of CSW and to review distress screening protocol.  The patient scored a 6 on the Psychosocial Distress Thermometer which indicates moderate distress. Clinical Social Worker talked with pt at length to assess for distress and other psychosocial needs. Pt lives in her own apt in [place] with her children and grandchildren. She reports they are moving out soon, but that all of these "extra people stress her out". Pt currently does not have insurance and was told she did not make enough to qualify for ACA. With pt's current diagnosis, she should qualify for medicaid and pt is aware to apply at [place]. She does get $16 in food stamps each month and should qualify based on income. Pt reports she has medication to assist with her anxiety, but her most used coping techniques include drinking and using other drugs regularly to cope. Pt did share one more healthy coping technique, watching tv. Pt drinks about 1-2 40s a day, smokes marijuana 3x a week and uses cocaine weekly. She is interested in [place] and NA meetings and CSW provided her with a list today. Pt was also provided with a detailed list of other substance abuse resources. CSW reviewed additional local resources to assist as well. CSW available for assistance if pt starts treatment at [facility]. Plan is still pending and CSW can follow as needed.   ONCBCN DISTRESS SCREENING 09/09/2016  Screening Type Initial Screening  Distress experienced in past week (1-10) 6  Practical problem type Housing;Insurance;Work/school  Family Problem type Children  Emotional problem type Depression;Nervousness/Anxiety;Adjusting to illness;Isolation/feeling alone;Feeling hopeless  Spiritual/Religous concerns type Facing my mortality  Information Concerns Type Lack of info about diagnosis;Lack of info about treatment;Lack  of info about maintaining fitness  Physical Problem type Pain;Nausea/vomiting;Sleep/insomnia;Breathing;Mouth sores/swallowing;Loss of appetitie;Constipation/diarrhea;Changes in urination;Tingling hands/feet;Skin dry/itchy;Swollen arms/legs;Other (comment)  Physician notified of physical symptoms Yes  Other referral to local [place] and NA meetings, substance abuse resource list provided    Clinical Social Worker follow up needed: No.  If yes, follow up plan: Loren Racer, [place]  [facility] Phone: [phone] Fax: [phone]

## 2016-09-09 NOTE — Telephone Encounter (Signed)
Appointments scheduled per 12/15 LOS. Patient given AVS report and calendars with future scheduled appointments. CT scan orders not in patient's chart during scheduling.

## 2016-09-09 NOTE — Patient Instructions (Signed)
Tandem Stance    Can hold onto counter if not feeling steady. Right foot in front of left, heel touching toe both feet "straight ahead". Stand on Foot Triangle of Support with both feet. Balance in this position _30__ seconds. Do with left foot in front of right. 3 times each  Copyright  VHI. All rights reserved.    Stance: single leg on floor. Raise leg. Hold _15__ seconds. Repeat with other leg. __3_ reps per set, _1__ sets per day .  Can hold onto counter to keep steady  Copyright  VHI. All rights reserved.    Tips for Energy Conservation for Activities of Daily Living . Plan ahead to avoid rushing. . Sit down to bathe and dry off. Wear a terry robe instead of drying off. . Use a shower/bath organizer to decrease leaning and reaching. . Use extension handles on sponges and brushes. Susa Simmonds grab rails in the bathroom or use an elevated toilet seat. Hoyle Barr out clothes and toiletries before dressing. . Minimize leaning over to put on clothes and shoes. Bring your foot to your knee to apply socks and shoes. . Wear comfortable shoes and low-heeled, slip on shoes. Wear button front shirts rather than pullovers. Housekeeping . Schedule household tasks throughout the week. . Do housework sitting down when possible. . Delegate heavy housework, shopping, laundry and child care when possible. . Drag or slide objects rather than lifting. . Sit when ironing and take rest periods. . Stop working before becoming overly tired. Shopping . Organize list by aisle. . Use a grocery cart for support. Marland Kitchen Shop at less busy times. . Ask for help with getting to the car. Meal Preparation . Use convenience and easy-to-prepare foods. . Use small appliances that take less effort to use. Marland Kitchen Prepare meals sitting down. . Soak dishes instead of scrubbing and let dishes air dry. . Prepare double portions and freeze half. Child Care . Plan activities that can be done sitting down, such as drawing  pictures, playing games, reading, and computer games. . Encourage children to climb up onto your lap or into the highchair instead of being lifted. . Make a game of the household chores so that children will want to help. . Delegate child care when possible.  Earlie Counts, PT, [place] [place]; [place], [place], [place] [address]   Ways to get started on an exercise program 1.  Start for 10 minutes per day with a walking program. 2. Work towards 30 minutes of exercise per day 3. When you do an aerobic exercise program start on a low level 4. Water aerobics is a good place due to decreased strain on your joints 5. Begin your exercise program gradually and progress slowly over time 6. When exercising use correct form. a. Keep neutral spine b. Engage abdominals c. Keep chest up  d. Chin down e. Do not lock your knees  Earlie Counts, PT [facility], [place], [place] [address] [phone]  Functional Quadriceps: Sit to Stand    Sit on edge of chair, feet flat on floor. Stand upright, extending knees fully. Repeat __5__ times per set. Do _1___ sets per session. Do ____ sessions per day. 1 http://orth.exer.us/735   Copyright  VHI. All rights reserved.  [place]    Lie on back, legs bent. Press hips up and rest on shoulder blades. Hold _5__ seconds, at highest position. Gently roll down one vertebrae  at a time to starting position.  Repeat __10_ times per session. Do __1_ sessions per day.  Copyright  VHI. All rights reserved.  FUNCTIONAL MOBILITY: Marching - Standing    March in place by lifting left leg up, then right. Alternate. _10__ reps per set, __1_ sets per day, _1__ days per week Hold onto a support.  Copyright  VHI. All rights reserved.  ABDUCTION: Standing (Active)    Stand, feet flat. Lift right leg out to side.  Complete _1__ sets of _10__ repetitions.  Perform _1__ sessions per day.  http://gtsc.exer.us/111   Copyright  VHI. All rights reserved.  EXTENSION: Standing (Active)    Stand, both feet flat. Draw right leg behind body as far as possible.  Complete _1__ sets of _10__ repetitions. Perform __1_ sessions per day.  http://gtsc.exer.us/77   Copyright  VHI. All rights reserved.  Push-Up, Wall    Inhaling, bend elbows and bring chest toward the wall. Exhaling, straighten arms. Repeat __10_ times. Do 1___ times per day.  Copyright  VHI. All rights reserved.  [facility] [address], [place], [place] [address] Phone # [phone] Fax [phone]

## 2016-09-11 ENCOUNTER — Encounter: Payer: Self-pay | Admitting: Hematology

## 2016-09-14 ENCOUNTER — Telehealth: Payer: Self-pay | Admitting: *Deleted

## 2016-09-14 ENCOUNTER — Ambulatory Visit (HOSPITAL_COMMUNITY)
Admission: RE | Admit: 2016-09-14 | Discharge: 2016-09-14 | Disposition: A | Discharge status: Home / Self Care | Payer: No Typology Code available for payment source | Source: Ambulatory Visit | Attending: Hematology | Admitting: Hematology

## 2016-09-14 ENCOUNTER — Encounter (HOSPITAL_COMMUNITY): Payer: Self-pay

## 2016-09-14 DIAGNOSIS — R918 Other nonspecific abnormal finding of lung field: Secondary | ICD-10-CM | POA: Insufficient documentation

## 2016-09-14 DIAGNOSIS — I517 Cardiomegaly: Secondary | ICD-10-CM | POA: Insufficient documentation

## 2016-09-14 DIAGNOSIS — C220 Liver cell carcinoma: Secondary | ICD-10-CM | POA: Insufficient documentation

## 2016-09-14 DIAGNOSIS — I313 Pericardial effusion (noninflammatory): Secondary | ICD-10-CM | POA: Insufficient documentation

## 2016-09-14 HISTORY — DX: Liver cell carcinoma: C22.0

## 2016-09-14 NOTE — Telephone Encounter (Signed)
Oncology Nurse Navigator Documentation  Oncology Nurse Navigator Flowsheets 09/14/2016  Navigator Location [facility]  Referral date to RadOnc/MedOnc -  Navigator Encounter Type Telephone  Telephone Outgoing Call;Patient Update  Abnormal Finding Date -  Confirmed Diagnosis Date -  Multidisiplinary Clinic Date -  Patient Visit Type -  Treatment Phase Pre-Tx/Tx Discussion  Barriers/Navigation Needs Coordination of Care;Education  Education Understanding Cancer/ Treatment Options--made her aware of tumor board recommendations and why she is not surgical candidate. She agrees to IR referral.  Interventions Coordination of Care  Coordination of Care Radiology--placed order per Dr. Burr Medico for IR to evaluate and treat w/Y-90  Education Method Verbal  Support Groups/Services -  Acuity -  Time Spent with Patient 15

## 2016-09-14 NOTE — Telephone Encounter (Signed)
Left VM for Knowledge to call back to discuss suggestion from tumor board today.

## 2016-09-15 ENCOUNTER — Telehealth: Payer: Self-pay | Admitting: Student

## 2016-09-15 ENCOUNTER — Other Ambulatory Visit (HOSPITAL_COMMUNITY): Payer: Self-pay | Admitting: Internal Medicine

## 2016-09-15 ENCOUNTER — Other Ambulatory Visit: Payer: Self-pay | Admitting: *Deleted

## 2016-09-15 DIAGNOSIS — C220 Liver cell carcinoma: Secondary | ICD-10-CM

## 2016-09-15 MED ORDER — HYDROXYZINE HCL 10 MG PO TABS
10.0000 mg | ORAL_TABLET | Freq: Three times a day (TID) | ORAL | 2 refills | Status: DC | PRN
Start: 2016-09-15 — End: 2016-12-07

## 2016-09-15 MED ORDER — TRAMADOL HCL 50 MG PO TABS: 50.0000 mg | ORAL_TABLET | Freq: Three times a day (TID) | ORAL | 2 refills | Status: DC | PRN

## 2016-09-15 MED ORDER — TRAMADOL HCL 50 MG PO TABS: 50.0000 mg | ORAL_TABLET | Freq: Three times a day (TID) | ORAL | 0 refills | Status: DC | PRN

## 2016-09-15 MED ORDER — DOCUSATE SODIUM 100 MG PO CAPS: 100.0000 mg | ORAL_CAPSULE | Freq: Two times a day (BID) | ORAL | 1 refills | Status: DC

## 2016-09-15 NOTE — Telephone Encounter (Signed)
Patient called regarding refill request. She requested tramadol, hydroxyzine and a stool softener as she has been constipated. Pharmacy called for tramadol. Hydroxyzine and colace e-precribed

## 2016-09-15 NOTE — Telephone Encounter (Signed)
Spoke with patient and informed her that we didn't contact her yesterday but that [facility] did.  Patient has already spoken with them but states that she does need some refills.  Will forward to MD.  Patient can be reached on her home number or her daughter's if she doesn't answer. Jazmin Hartsell,CMA

## 2016-09-15 NOTE — Telephone Encounter (Signed)
Pt is returning a call to [place]. She said that we called yesterday and she would like to know what we wanted. I didn't see any notes in Epic. jw

## 2016-09-23 ENCOUNTER — Other Ambulatory Visit: Payer: Self-pay | Admitting: *Deleted

## 2016-09-23 DIAGNOSIS — C220 Liver cell carcinoma: Secondary | ICD-10-CM

## 2016-09-27 ENCOUNTER — Other Ambulatory Visit: Payer: No Typology Code available for payment source

## 2016-09-27 ENCOUNTER — Encounter: Payer: No Typology Code available for payment source | Admitting: Hematology

## 2016-09-27 NOTE — Progress Notes (Signed)
No show  This encounter was created in error - please disregard.

## 2016-09-28 ENCOUNTER — Other Ambulatory Visit (HOSPITAL_COMMUNITY): Payer: Self-pay | Admitting: Interventional Radiology

## 2016-09-28 ENCOUNTER — Ambulatory Visit
Admission: RE | Admit: 2016-09-28 | Discharge: 2016-09-28 | Disposition: A | Discharge status: Home / Self Care | Payer: No Typology Code available for payment source | Source: Ambulatory Visit | Attending: Hematology | Admitting: Hematology

## 2016-09-28 ENCOUNTER — Other Ambulatory Visit: Payer: Self-pay | Admitting: Radiology

## 2016-09-28 DIAGNOSIS — C220 Liver cell carcinoma: Secondary | ICD-10-CM

## 2016-09-28 DIAGNOSIS — C229 Malignant neoplasm of liver, not specified as primary or secondary: Secondary | ICD-10-CM

## 2016-09-28 HISTORY — PX: IR GENERIC HISTORICAL: IMG1180011

## 2016-09-28 NOTE — Consult Note (Signed)
Chief Complaint  Patient presents with  . Advice Only    Consult for Hepatocellular Carcinoma/possible Y-90 SIRT    Referring Physician(s): Feng,Yan (Oncology) Lucio Edward (GI)  History of Present Illness: Lisa [name] is a 61 y.o. female with past medical history significant for mitral valve prolapse, hypertension, alcohol abuse, hepatitis C and cirrhosis who was found to have 2 large infiltrative lesions involving the anterior segment of the right lobe of the liver on abdominal MRI performed 08/31/2016 performed for elevated AFP levels. Patient presents today for percutaneous evaluation management and treatment. She is accompanied by her friend and daughter though serves as her own historian.  The patient reports mild right upper quadrant abdominal pain. She is otherwise asymptomatic in regards to her hepatocellular carcinoma. No unintentional weight loss or weight gain. No increased abdominal girth. No yellowing of the skin or eyes. No change in mental status.    Past Medical History:  Diagnosis Date  . Alcohol abuse, daily use   . Cirrhosis of liver ([place])   . Helicobacter pylori gastritis   . Hepatitis C   . Hepatocellular carcinoma ([place]) dx'd 08/2016  . HTN (hypertension)   . Hx of cardiovascular stress test    Lexiscan Myoview 3/16: No ischemia, EF 66%, normal study  . Leg pain    ABIs 3/16:  normal   . Mitral valve prolapse   . Osteopetrosis   . Poor dental hygiene   . Tobacco use     Past Surgical History:  Procedure Laterality Date  . ABDOMINAL HYSTERECTOMY    . CARPAL TUNNEL RELEASE    . LAPAROSCOPY    . rod in right arm      Allergies: Celecoxib and Sulfonamide derivatives  Medications: Prior to Admission medications   Medication Sig Start Date End Date Taking? Authorizing Provider  amLODipine (NORVASC) 10 MG tablet Take 1 tablet (10 mg total) by mouth daily. 08/30/16  Yes Jettie Booze, MD  aspirin 81 MG tablet Take 81 mg by mouth  daily.   Yes Historical Provider, MD  ferrous sulfate 325 (65 FE) MG tablet Take 1 tablet (325 mg total) by mouth daily with breakfast. 01/09/15  Yes Leone Haven, MD  folic acid (FOLVITE) 1 MG tablet Take 1 tablet (1 mg total) by mouth daily. Patient taking differently: Take 1 mg by mouth daily. PT NOT SURE IF SHE IS TAKING 08/03/16  Yes Alyssa A Haney, MD  hydrochlorothiazide (HYDRODIURIL) 25 MG tablet Take 1 tablet (25 mg total) by mouth daily. 08/30/16  Yes Jettie Booze, MD  hydrOXYzine (ATARAX/VISTARIL) 10 MG tablet Take 1 tablet (10 mg total) by mouth 3 (three) times daily as needed for anxiety (or sleep). 09/15/16  Yes Alyssa A Haney, MD  lisinopril (PRINIVIL,ZESTRIL) 20 MG tablet Take 1 tablet (20 mg total) by mouth daily. 08/30/16  Yes Jettie Booze, MD  naphazoline-pheniramine (NAPHCON-A) 0.025-0.3 % ophthalmic solution Place 1 drop into both eyes every 4 (four) hours as needed for irritation. 06/02/16  Yes Konrad Felix, PA  Thiamine HCl (VITAMIN B-1) 250 MG tablet Take 1 tablet (250 mg total) by mouth daily. 06/10/16  Yes Alyssa A Haney, MD  traMADol (ULTRAM) 50 MG tablet Take 1 tablet (50 mg total) by mouth every 8 (eight) hours as needed. 09/15/16  Yes Alyssa A Lincoln Brigham, MD  vitamin B-12 (CYANOCOBALAMIN) 250 MCG tablet Take 1 tablet (250 mcg total) by mouth daily. 01/01/16  Yes Aquilla Hacker, MD  docusate sodium (  COLACE) 100 MG capsule Take 1 capsule (100 mg total) by mouth 2 (two) times daily. Patient not taking: Reported on 09/28/2016 09/15/16   Veatrice Bourbon, MD  mometasone-formoterol (DULERA) 200-5 MCG/ACT AERO Inhale 1 puff into the lungs 2 (two) times daily. Patient not taking: Reported on 09/28/2016 08/10/16   Veatrice Bourbon, MD     Family History  Problem Relation Age of Onset  . Cancer Father     Prostate  . Heart disease Mother   . Stroke Sister   . Heart disease Sister   . Lupus Sister   . Heart attack Neg Hx   . Colon cancer Neg Hx   . Colon polyps Neg Hx      Social History   Social History  . Marital status: Divorced    Spouse name: N/A  . Number of children: 1  . Years of education: N/A   Occupational History  . personal care asst [employer] History Main Topics  . Smoking status: Current Every Day Smoker    Packs/day: 0.50    Years: 46.00    Types: Cigarettes    Start date: 09/26/1969  . Smokeless tobacco: Never Used     Comment: has cut back about 1/2 pk per day  . Alcohol use 25.2 oz/week    42 Standard drinks or equivalent per week     Comment: 40 oz beer a day   . Drug use:     Types: Marijuana, Cocaine     Comment: Still using marijuana.  Past history of IV drug use  . Sexual activity: Not Currently   Other Topics Concern  . Not on file   Social History Narrative   Lives alone in [place] in an apartment. 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 No current source of income. Goes to [employer] for help as needed.   One daughter, age 3. Six grandchildren, one great-grand child.     Rides bus for transportation    ECOG Status: 1 - Symptomatic but completely ambulatory  Review of Systems: A 12 point ROS discussed and pertinent positives are indicated in the HPI above.  All other systems are negative.  Review of Systems  Constitutional: Negative for activity change and appetite change.  Respiratory: Negative.   Cardiovascular: Negative.   Gastrointestinal: Positive for abdominal pain.  Genitourinary: Negative.     Vital Signs: BP (!) 150/72 (BP Location: Right Arm, Patient Position: Sitting, Cuff Size: Normal)   Pulse 78   Temp 97.9 F (36.6 C) (Oral)   Resp 14   Ht 5\' 3"  (1.6 m)   Wt 125 lb (56.7 kg)   SpO2 100%   BMI 22.14 kg/m   Physical Exam  Constitutional: She appears well-developed and well-nourished.  HENT:  Head: Atraumatic.  Cardiovascular: Intact distal pulses.   Murmur heard. Midsystolic murmur appreciated. Easily palpable  right common femoral and dorsalis pedis artery pulses.  Pulmonary/Chest: Effort normal and breath sounds normal.  Abdominal: There is tenderness.  Pain with palpation of the right upper abdominal quadrant.  Skin: Skin is warm and dry.  Psychiatric: She has a normal mood and affect. Her behavior is normal.  Nursing note and vitals reviewed.    Imaging:  Selected images from abdominal MRI performed 08/31/2016 reviewed in detail with the patient.  Ct Chest Wo Contrast  Result Date: 09/14/2016 CLINICAL DATA:  Hepatocellular carcinoma. Shortness of breath with exertion. EXAM: CT  CHEST WITHOUT CONTRAST TECHNIQUE: Multidetector CT imaging of the chest was performed following the standard protocol without IV contrast. COMPARISON:  None. FINDINGS: Cardiovascular: The heart is enlarged. Small pericardial effusion is evident. Atherosclerotic calcification is noted in the wall of the thoracic aorta. Mediastinum/Nodes: 9 mm short axis right paratracheal lymph node is upper normal for size. Scattered small mediastinal lymph nodes are identified, but no definite mediastinal lymphadenopathy by CT size criteria. No evidence for gross hilar lymphadenopathy although assessment is limited by the lack of intravenous contrast on today's study. The esophagus has normal imaging features. Prominent upper normal lymph nodes are seen in both axillary regions. Lungs/Pleura: 3 mm irregular nodule identified right upper lobe on image 35 series 7. 3 mm right upper lobe nodule also seen on image 30 series 7. No suspicious pulmonary nodule or mass in the left lung. There is no pulmonary edema on today's study. No evidence for pleural effusion. Scattered areas of cysts subsegmental atelectasis or linear scarring noted bilaterally. Upper Abdomen: Nodular hepatic contour compatible with cirrhosis. Small volume perihepatic ascites identified. Musculoskeletal: Bone windows reveal no worrisome lytic or sclerotic osseous lesions.  IMPRESSION: 1. Tiny right upper lobe pulmonary nodules. Nonspecific, but follow-up will be required as metastatic disease could have this appearance. Consider repeat CT scan of the chest in 3-6 months. 2. Cardiomegaly with small pericardial effusion. Electronically Signed   By: Misty Stanley M.D.   On: 09/14/2016 12:49   Mr Liver W [id number] Contrast  Result Date: 08/31/2016 CLINICAL DATA:  Chronic hepatitis-C. Elevated alpha-fetoprotein levels. Follow-up liver lesion. EXAM: MRI ABDOMEN WITHOUT AND WITH CONTRAST TECHNIQUE: Multiplanar multisequence MR imaging of the abdomen was performed both before and after the administration of intravenous contrast. CONTRAST:  11mL MULTIHANCE GADOBENATE DIMEGLUMINE 529 MG/ML IV SOLN COMPARISON:  MRI 02/23/2016.  Ultrasound 08/17/2016. FINDINGS: Despite efforts by the technologist and patient, motion artifact is present on today's exam and could not be eliminated. This reduces exam sensitivity and specificity. Lower chest: Trace pleural fluid on the right. The visualized lower chest otherwise appears unremarkable. Hepatobiliary: Again demonstrated are morphologic changes of cirrhosis. The central lesion previously demonstrated within the right hepatic lobe has enlarged, now measuring 3.8 x 2.1 cm (image 24 of series 3). There is progressive multifocal tumor throughout segment 8 with a peripheral component measuring up to 5.0 x 3.2 cm on image 28 of series 3. This tumor demonstrates T2 hyperintensity and heterogeneous enhancement following contrast. 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 No involvement of the left hepatic lobe seen. The gallbladder and biliary system appear unremarkable. Pancreas: Unremarkable. No pancreatic ductal dilatation or surrounding inflammatory changes. Spleen: Prior splenectomy. Adrenals/Urinary Tract: Both adrenal glands appear normal. No suspicious renal findings. Probable small cysts bilaterally,  stable. Stomach/Bowel: No evidence of bowel wall thickening, distention or surrounding inflammatory change. Vascular/Lymphatic: Several prominent lymph nodes within the gastrohepatic ligament and upper retroperitoneum are again noted, similar to the previous study. These measure up to 10 mm on image 38 of series 901. As above, probable tumor thrombus in the right portal vein. Central mesenteric varices are noted. Other: Mildly increased ascites without visible peritoneal nodularity. Musculoskeletal: No acute or significant osseous findings. IMPRESSION: 1. Progressive multifocal hypervascular tumor within the anterior right hepatic lobe (segment 8) consistent with hepatocellular carcinoma. 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 2. Stable nonspecific prominent lymph nodes within the gastrohepatic ligament and upper retroperitoneum. 3. Mildly increased ascites without demonstrated peritoneal nodularity. Electronically Signed  By: Richardean Sale M.D.   On: 08/31/2016 11:37    Labs:  CBC:  Recent Labs  12/24/15 1623 03/30/16 1835 07/28/16 1400 07/29/16 0541  WBC 4.4 5.6 4.8 6.5  HGB 12.3 12.7 12.3 12.7  HCT 38.8 37.4 35.9* 37.0  PLT 320 322 277 259    COAGS:  Recent Labs  07/28/16 2336  INR 1.17    BMP:  Recent Labs  12/24/15 1623 02/23/16 1445 03/30/16 1835 07/28/16 1400 07/29/16 0541  NA 139  --  138 139 135  K 4.6  --  3.7 3.7 3.1*  CL 106  --  106 108 102  CO2 24  --  25 24 27   GLUCOSE 69  --  77 78 101*  BUN 7  --  <5* <5* 5*  CALCIUM 9.5  --  8.5* 8.7* 8.8*  CREATININE 0.53 0.75 0.55 0.59 0.54  GFRNONAA >89 >60 >60 >60 >60  GFRAA >89 >60 >60 >60 >60    LIVER FUNCTION TESTS:  Recent Labs  12/24/15 1623 03/30/16 1835 07/28/16 1400 07/29/16 0541  BILITOT 0.9 0.5 1.3* 1.5*  AST 65* 87* 73* 66*  ALT 24 29 27 24   ALKPHOS 76 89 91 89  PROT 9.0* 8.7* 8.0 7.4  ALBUMIN 3.5* 3.2* 2.9* 2.6*    TUMOR MARKERS:  Recent Labs  07/29/16 0541  09/01/16 1625  AFPTM 11.1* 15.0*    Assessment and Plan:  Aprill Ramus is a 61 y.o. female with past medical history significant for mitral valve prolapse, hypertension, alcohol abuse, hepatitis C and cirrhosis who was found to have 2 large infiltrative lesions involving the anterior segment of the right lobe of the liver on abdominal MRI performed 08/31/2016 performed for elevated AFP levels.  Patient reports mild right upper quadrant abdominal pain though is otherwise without complaint in regards to her [place].  Abdominal MRI demonstrates infiltrate of lesions involving the anterior segment of the right lobe of the liver with dominant lesion measuring approximately 5.0 x 3.2 cm and more central lesion measuring 3.8 x 2.1 cm.  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 No definitive areas of abnormal enhancement is seen within the left lobe of the liver.  Selected images from abdominal MRI performed 08/31/2016 were reviewed in detail with the patient.  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   Given the size of the hepatic lesions as well as the malignant thrombosis of the portal vein, she is not a candidate for her hepatic microwave ablation.  Also given thrombosis of the right portal vein, the patient is not a candidate for either bland or chemotherapy embolization.  As such, prolonged conversations were held with the patient regarding the benefits and risks (including but not limited to bleeding, vessel injury, nontarget embolization and worsening hepatic function) of Y 90 radioembolization. I explained that this procedure is performed for palliative purposes with the hope of disease stabilization to mild improvement for somewhere from 3-6 months. As at the present time, her disease is limited to the right lobe the liver,  I am hopeful only the right lobe will have to be treated.  Following this prolonged conversation, the patient wishes to pursue Y 12 radioembolization. As such, will arrange for the acquisition of a cirrhotic protocol CTA of the abdomen and pelvis as well as repeat CMP levels to ensure adequate hepatic synthetic function (  most recent bilirubin was 1.5 on 07/29/16).  Ultimately, the Y 90 radioembolization procedures will be performed at [facility] on an outpatient basis.  The patient was encouraged to call the interventional radiology clinic with any interval questions or concerns.  Thank you for this interesting consult.  I greatly enjoyed meeting Chundra Lohn and look forward to participating in their care.  A copy of this report was sent to the requesting provider on this date.  Electronically Signed: Sandi Mariscal 09/28/2016, 4:16 PM   I spent a total of 40 Minutes in face to face in clinical consultation, greater than 50% of which was counseling/coordinating care for Multifocal hepatocellular carcinoma

## 2016-09-29 ENCOUNTER — Ambulatory Visit (HOSPITAL_COMMUNITY)
Admission: RE | Admit: 2016-09-29 | Discharge: 2016-09-29 | Disposition: A | Discharge status: Home / Self Care | Payer: No Typology Code available for payment source | Source: Ambulatory Visit | Attending: Interventional Radiology | Admitting: Interventional Radiology

## 2016-09-29 ENCOUNTER — Encounter (HOSPITAL_COMMUNITY): Payer: Self-pay

## 2016-09-29 ENCOUNTER — Other Ambulatory Visit: Payer: Self-pay | Admitting: *Deleted

## 2016-09-29 DIAGNOSIS — C220 Liver cell carcinoma: Secondary | ICD-10-CM

## 2016-09-29 DIAGNOSIS — I517 Cardiomegaly: Secondary | ICD-10-CM | POA: Insufficient documentation

## 2016-09-29 DIAGNOSIS — J9811 Atelectasis: Secondary | ICD-10-CM | POA: Insufficient documentation

## 2016-09-29 DIAGNOSIS — K766 Portal hypertension: Secondary | ICD-10-CM | POA: Insufficient documentation

## 2016-09-29 DIAGNOSIS — I70203 Unspecified atherosclerosis of native arteries of extremities, bilateral legs: Secondary | ICD-10-CM | POA: Insufficient documentation

## 2016-09-29 DIAGNOSIS — B1920 Unspecified viral hepatitis C without hepatic coma: Secondary | ICD-10-CM | POA: Insufficient documentation

## 2016-09-29 DIAGNOSIS — I313 Pericardial effusion (noninflammatory): Secondary | ICD-10-CM | POA: Insufficient documentation

## 2016-09-29 LAB — CREATININE, SERUM
Creatinine, Ser: 0.55 mg/dL (ref 0.44–1.00)
GFR calc Af Amer: >60 mL/min (ref >60)
GFR calc non Af Amer: >60 mL/min (ref >60)

## 2016-09-29 LAB — HEPATIC FUNCTION PANEL
ALK PHOS: 72 U/L (ref 38–126)
ALT: 27 U/L (ref 14–54)
AST: 69 U/L — ABNORMAL HIGH (ref 15–41)
Albumin: 3.3 g/dL — ABNORMAL LOW (ref 3.5–5.0)
BILIRUBIN DIRECT: 0.3 mg/dL (ref 0.1–0.5)
BILIRUBIN INDIRECT: 1 mg/dL — AB (ref 0.3–0.9)
BILIRUBIN TOTAL: 1.3 mg/dL — AB (ref 0.3–1.2)
Total Protein: 8.3 g/dL — ABNORMAL HIGH (ref 6.5–8.1)

## 2016-09-29 LAB — POCT I-STAT CREATININE: CREATININE: 0.6 mg/dL (ref 0.44–1.00)

## 2016-09-29 MED ORDER — IOPAMIDOL (ISOVUE-370) INJECTION 76%
100.0000 mL | Freq: Once | INTRAVENOUS | Status: AC | PRN
  Administered 2016-09-29: 100 mL via INTRAVENOUS

## 2016-09-29 MED ORDER — IOPAMIDOL (ISOVUE-370) INJECTION 76%
INTRAVENOUS | Status: DC
Start: 2016-09-29 — End: 2016-09-30
  Filled 2016-09-29: qty 100

## 2016-09-30 ENCOUNTER — Encounter: Payer: Self-pay | Admitting: Interventional Radiology

## 2016-09-30 ENCOUNTER — Telehealth: Payer: Self-pay | Admitting: *Deleted

## 2016-09-30 NOTE — Telephone Encounter (Signed)
I s/w Lisa [name] this morning and advised her to apply for financial assistance for the Y-90 radioembolization to be performed at [facility]. She understands and states she will as soon as she can./vm

## 2016-10-11 ENCOUNTER — Encounter (HOSPITAL_COMMUNITY): Payer: Self-pay

## 2016-10-11 ENCOUNTER — Emergency Department (HOSPITAL_COMMUNITY)
Admission: EM | Admit: 2016-10-11 | Discharge: 2016-10-11 | Disposition: A | Discharge status: Home / Self Care | Payer: No Typology Code available for payment source | Attending: Emergency Medicine | Admitting: Emergency Medicine

## 2016-10-11 ENCOUNTER — Emergency Department (HOSPITAL_COMMUNITY): Payer: No Typology Code available for payment source

## 2016-10-11 DIAGNOSIS — F17210 Nicotine dependence, cigarettes, uncomplicated: Secondary | ICD-10-CM | POA: Insufficient documentation

## 2016-10-11 DIAGNOSIS — R109 Unspecified abdominal pain: Secondary | ICD-10-CM | POA: Insufficient documentation

## 2016-10-11 DIAGNOSIS — I10 Essential (primary) hypertension: Secondary | ICD-10-CM | POA: Insufficient documentation

## 2016-10-11 DIAGNOSIS — Z79899 Other long term (current) drug therapy: Secondary | ICD-10-CM | POA: Insufficient documentation

## 2016-10-11 DIAGNOSIS — Z7982 Long term (current) use of aspirin: Secondary | ICD-10-CM | POA: Insufficient documentation

## 2016-10-11 LAB — URINALYSIS, ROUTINE W REFLEX MICROSCOPIC
Bilirubin Urine: NEGATIVE
Glucose, UA: NEGATIVE mg/dL
Glucose, UA: NEGATIVE mg/dL
Hgb urine dipstick: NEGATIVE
Hgb urine dipstick: NEGATIVE
KETONES UR: NEGATIVE mg/dL
Ketones, ur: NEGATIVE mg/dL
Leukocytes, UA: NEGATIVE
Nitrite: NEGATIVE
Nitrite: NEGATIVE
PH: 7 (ref 5.0–8.0)
PROTEIN: 30 mg/dL — AB
Protein, ur: 30 mg/dL — AB
SPECIFIC GRAVITY, URINE: 1.018 (ref 1.005–1.030)
Specific Gravity, Urine: 1.016 (ref 1.005–1.030)
pH: 6 (ref 5.0–8.0)

## 2016-10-11 LAB — CBC WITH DIFFERENTIAL/PLATELET
Basophils Absolute: 0 10*3/uL (ref 0.0–0.1)
Basophils Relative: 0 %
Eosinophils Absolute: 0.1 10*3/uL (ref 0.0–0.7)
Eosinophils Relative: 1 %
HCT: 36.9 % (ref 36.0–46.0)
HEMOGLOBIN: 12.4 g/dL (ref 12.0–15.0)
Lymphocytes Relative: 46 %
Lymphs Abs: 2.7 10*3/uL (ref 0.7–4.0)
MCH: 32.5 pg (ref 26.0–34.0)
MCHC: 33.6 g/dL (ref 30.0–36.0)
MCV: 96.6 fL (ref 78.0–100.0)
MONO ABS: 1.2 10*3/uL — AB (ref 0.1–1.0)
Monocytes Relative: 20 %
NEUTROS ABS: 1.9 10*3/uL (ref 1.7–7.7)
Neutrophils Relative %: 33 %
PLATELETS: 207 10*3/uL (ref 150–400)
RBC: 3.82 MIL/uL — ABNORMAL LOW (ref 3.87–5.11)
RDW: 15.3 % (ref 11.5–15.5)
WBC: 5.9 10*3/uL (ref 4.0–10.5)

## 2016-10-11 LAB — COMPREHENSIVE METABOLIC PANEL
ALBUMIN: 3.2 g/dL — AB (ref 3.5–5.0)
ALK PHOS: 81 U/L (ref 38–126)
ALT: 28 U/L (ref 14–54)
AST: 74 U/L — ABNORMAL HIGH (ref 15–41)
Anion gap: 9 (ref 5–15)
BUN: 9 mg/dL (ref 6–20)
CALCIUM: 8.8 mg/dL — AB (ref 8.9–10.3)
CO2: 22 mmol/L (ref 22–32)
CREATININE: 0.71 mg/dL (ref 0.44–1.00)
Chloride: 106 mmol/L (ref 101–111)
GFR calc Af Amer: >60 mL/min (ref >60)
GFR calc non Af Amer: >60 mL/min (ref >60)
GLUCOSE: 76 mg/dL (ref 65–99)
Potassium: 3.9 mmol/L (ref 3.5–5.1)
SODIUM: 137 mmol/L (ref 135–145)
Total Bilirubin: 1 mg/dL (ref 0.3–1.2)
Total Protein: 8.2 g/dL — ABNORMAL HIGH (ref 6.5–8.1)

## 2016-10-11 MED ORDER — SODIUM CHLORIDE 0.9 % IV SOLN
INTRAVENOUS | Status: DC
  Administered 2016-10-11: 19:00:00 via INTRAVENOUS

## 2016-10-11 MED ORDER — MORPHINE SULFATE (PF) 4 MG/ML IV SOLN
4.0000 mg | Freq: Once | INTRAVENOUS | Status: AC
  Administered 2016-10-11: 4 mg via INTRAVENOUS
  Filled 2016-10-11: qty 1

## 2016-10-11 MED ORDER — METHOCARBAMOL 500 MG PO TABS: 500.0000 mg | ORAL_TABLET | Freq: Two times a day (BID) | ORAL | 0 refills | Status: AC

## 2016-10-11 MED ORDER — OXYCODONE HCL 5 MG PO TABS: 5.0000 mg | ORAL_TABLET | ORAL | 0 refills | Status: DC | PRN

## 2016-10-11 NOTE — ED Provider Notes (Signed)
[facility] DEPT Provider Note   CSN: [id number] Arrival date & time: 10/11/16  1446     History   Chief Complaint Chief Complaint  Patient presents with  . Flank Pain    HPI Lisa [name] is a 61 y.o. female.  61 year old female with history of hepatocellular carcinoma presents with right sided flank pain that radiates to her right groin 2 days. Pain is characterized as dull and constant and worse with ambulation. Denies any hematuria or dysuria. No fever or chills. No vomiting or diarrhea. Has used ibuprofen with limited relief. Denies any recent history of trauma. No bowel or bladder dysfunction. Denies any back pain.      Past Medical History:  Diagnosis Date  . Alcohol abuse, daily use   . Cirrhosis of liver ([place])   . Helicobacter pylori gastritis   . Hepatitis C   . Hepatocellular carcinoma ([place]) dx'd 08/2016  . HTN (hypertension)   . Hx of cardiovascular stress test    Lexiscan Myoview 3/16: No ischemia, EF 66%, normal study  . Leg pain    ABIs 3/16:  normal   . Mitral valve prolapse   . Osteopetrosis   . Poor dental hygiene   . Tobacco use     Patient Active Problem List   Diagnosis Date Noted  . Hepatocellular carcinoma ([place]) 09/09/2016  . Cardiovascular disease 08/03/2016  . Health care maintenance 08/03/2016  . Aortic atherosclerosis ([place]) 07/28/2016  . Polysubstance abuse 07/28/2016  . Liver mass 06/10/2016  . Cirrhosis of liver without ascites ([place]) 05/26/2015  . Aching pain 03/10/2015  . Mitral regurgitation 02/16/2015  . Anemia 12/29/2014  . Abdominal pain 12/23/2014  . Thigh pain 12/10/2014  . Productive cough 12/10/2014  . Allergic rhinitis 11/07/2014  . Tobacco abuse 07/24/2014  . Heart murmur 07/24/2014  . Neuropathic pain 07/07/2014  . Hypertensive urgency 05/21/2014  . Back pain 05/21/2014  . Chest pain 03/31/2014  . Alcohol abuse 08/19/2013  . Anxiety state, unspecified 08/19/2013  . Depression 01/24/2013  . Osteopetrosis     . Hep C w/o coma, chronic ([place])   . HYPERTENSION, BENIGN 03/18/2010    Past Surgical History:  Procedure Laterality Date  . ABDOMINAL HYSTERECTOMY    . CARPAL TUNNEL RELEASE    . IR GENERIC HISTORICAL  09/28/2016   IR RADIOLOGIST EVAL & MGMT 09/28/2016 Sandi Mariscal, MD [facility] INTERV RAD  . LAPAROSCOPY    . rod in right arm      OB History    Gravida Para Term Preterm AB Living   1 1 1     1    SAB TAB Ectopic Multiple Live Births                   Home Medications    Prior to Admission medications   Medication Sig Start Date End Date Taking? Authorizing Provider  amLODipine (NORVASC) 10 MG tablet Take 1 tablet (10 mg total) by mouth daily. 08/30/16   Jettie Booze, MD  aspirin 81 MG tablet Take 81 mg by mouth daily.    Historical Provider, MD  docusate sodium (COLACE) 100 MG capsule Take 1 capsule (100 mg total) by mouth 2 (two) times daily. Patient not taking: Reported on 09/28/2016 09/15/16   Veatrice Bourbon, MD  ferrous sulfate 325 (65 FE) MG tablet Take 1 tablet (325 mg total) by mouth daily with breakfast. 01/09/15   Leone Haven, MD  folic acid (FOLVITE) 1 MG tablet Take 1 tablet (1  mg total) by mouth daily. Patient taking differently: Take 1 mg by mouth daily. PT NOT SURE IF SHE IS TAKING 08/03/16   Veatrice Bourbon, MD  hydrochlorothiazide (HYDRODIURIL) 25 MG tablet Take 1 tablet (25 mg total) by mouth daily. 08/30/16   Jettie Booze, MD  hydrOXYzine (ATARAX/VISTARIL) 10 MG tablet Take 1 tablet (10 mg total) by mouth 3 (three) times daily as needed for anxiety (or sleep). 09/15/16   Alyssa A Haney, MD  lisinopril (PRINIVIL,ZESTRIL) 20 MG tablet Take 1 tablet (20 mg total) by mouth daily. 08/30/16   Jettie Booze, MD  mometasone-formoterol (DULERA) 200-5 MCG/ACT AERO Inhale 1 puff into the lungs 2 (two) times daily. Patient not taking: Reported on 09/28/2016 08/10/16   Veatrice Bourbon, MD  naphazoline-pheniramine (NAPHCON-A) 0.025-0.3 % ophthalmic solution Place 1  drop into both eyes every 4 (four) hours as needed for irritation. 06/02/16   Konrad Felix, PA  Thiamine HCl (VITAMIN B-1) 250 MG tablet Take 1 tablet (250 mg total) by mouth daily. 06/10/16   Veatrice Bourbon, MD  traMADol (ULTRAM) 50 MG tablet Take 1 tablet (50 mg total) by mouth every 8 (eight) hours as needed. 09/15/16   Veatrice Bourbon, MD  vitamin B-12 (CYANOCOBALAMIN) 250 MCG tablet Take 1 tablet (250 mcg total) by mouth daily. 01/01/16   Aquilla Hacker, MD    Family History Family History  Problem Relation Age of Onset  . Cancer Father     Prostate  . Heart disease Mother   . Stroke Sister   . Heart disease Sister   . Lupus Sister   . Heart attack Neg Hx   . Colon cancer Neg Hx   . Colon polyps Neg Hx     Social History Social History  Substance Use Topics  . Smoking status: Current Every Day Smoker    Packs/day: 0.50    Years: 46.00    Types: Cigarettes    Start date: 09/26/1969  . Smokeless tobacco: Never Used     Comment: has cut back about 1/2 pk per day  . Alcohol use 25.2 oz/week    42 Standard drinks or equivalent per week     Comment: 40 oz beer a day      Allergies   Celecoxib and Sulfonamide derivatives   Review of Systems Review of Systems  All other systems reviewed and are negative.    Physical Exam Updated Vital Signs BP (!) 118/50 (BP Location: Right Arm)   Pulse 83   Temp 98.6 F (37 C) (Oral)   Resp 18   SpO2 100%   Physical Exam  Constitutional: She is oriented to person, place, and time. She appears well-developed and well-nourished.  Non-toxic appearance. No distress.  HENT:  Head: Normocephalic and atraumatic.  Eyes: Conjunctivae, EOM and lids are normal. Pupils are equal, round, and reactive to light.  Neck: Normal range of motion. Neck supple. No tracheal deviation present. No thyroid mass present.  Cardiovascular: Normal rate, regular rhythm and normal heart sounds.  Exam reveals no gallop.   No murmur heard. Pulmonary/Chest:  Effort normal and breath sounds normal. No stridor. No respiratory distress. She has no decreased breath sounds. She has no wheezes. She has no rhonchi. She has no rales.  Abdominal: Soft. Normal appearance and bowel sounds are normal. She exhibits no distension. There is no tenderness. There is no rebound and no CVA tenderness.  Musculoskeletal: Normal range of motion. She exhibits no edema or tenderness.  Arms:      Legs: No masses appreciated. No lymphadenopathy  Neurological: She is alert and oriented to person, place, and time. She has normal strength. No cranial nerve deficit or sensory deficit. GCS eye subscore is 4. GCS verbal subscore is 5. GCS motor subscore is 6.  Skin: Skin is warm and dry. No abrasion and no rash noted.  Psychiatric: She has a normal mood and affect. Her speech is normal and behavior is normal.  Nursing note and vitals reviewed.    ED Treatments / Results  Labs (all labs ordered are listed, but only abnormal results are displayed) Labs Reviewed  URINALYSIS, ROUTINE W REFLEX MICROSCOPIC - Abnormal; Notable for the following:       Result Value   Color, Urine AMBER (*)    APPearance HAZY (*)    Bilirubin Urine SMALL (*)    Protein, ur 30 (*)    Leukocytes, UA TRACE (*)    Bacteria, UA FEW (*)    Squamous Epithelial / LPF TOO NUMEROUS TO COUNT (*)    All other components within normal limits    EKG  EKG Interpretation None       Radiology No results found.  Procedures Procedures (including critical care time)  Medications Ordered in ED Medications - No data to display   Initial Impression / Assessment and Plan / ED Course  I have reviewed the triage vital signs and the nursing notes.  Pertinent labs & imaging results that were available during my care of the patient were reviewed by me and considered in my medical decision making (see chart for details).  Clinical Course     Renal ultrasound negative for acute process. Medicated  for pain here. Will prescribe short course of analgesics and muscle relaxants and she will follow with her doctor.  Final Clinical Impressions(s) / ED Diagnoses   Final diagnoses:  None    New Prescriptions New Prescriptions   No medications on file     Lacretia Leigh, MD 10/11/16 2150

## 2016-10-11 NOTE — ED Triage Notes (Signed)
Per Pt, Pt reports having lower right back pain that radiates into her groin that started yesterday. Pt has HX of Cirrhosis of the Liver. Denies any urinary symptoms, nausea, vomiting, or diarrhea. Reports some constipation.

## 2016-10-11 NOTE — ED Notes (Signed)
Pt given sandwich and ginger ale.

## 2016-10-17 ENCOUNTER — Telehealth: Payer: Self-pay | Admitting: *Deleted

## 2016-10-17 ENCOUNTER — Encounter: Payer: Self-pay | Admitting: *Deleted

## 2016-10-17 NOTE — Progress Notes (Signed)
[place] [employer]  [employer] representative was referred by patient and patient navigator for financial concerns.  CSW contacted patient at home to offer support and assess for needs.  Patient stated her primary concern was being unable to get recommended procedure due to being uninsured.  Patient stated she has met with the financial office at [facility] and applied for Medicaid.  Patient also has the orange card; which she was informed would not cover the procedure.  She also has an appointment at the [employer] tomorrow to apply for Medicaid.  CSw and patient discussed the application process for SSD and patient has necessary documentation she needs for her appointment.  CSW will continue to support and research additional resources.    Johnnye Lana, MSW, LCSW, OSW-C Clinical Social Worker [facility] [phone]

## 2016-10-17 NOTE — Telephone Encounter (Signed)
Oncology Nurse Navigator Documentation  Oncology Nurse Navigator Flowsheets 10/17/2016  Navigator Location [facility]  Referral date to RadOnc/MedOnc -  Navigator Encounter Type Telephone  Telephone Outgoing Call  Abnormal Finding Date -  Confirmed Diagnosis Date -  Multidisiplinary Clinic Date -  Patient Visit Type -  Treatment Phase -  Barriers/Navigation Needs [employer] and not able to follow up on Y-90 procedure.  Education -  Interventions Referrals  Referrals Social Work--left VM for CSW to call her to advise her on how to proceed with social security disability office.  Coordination of Care -  Education Method -  Support Groups/Services -  Acuity -  Time Spent with Patient 15  Reports no insurance, so she can't proceed with the Y-90 procedure. Has applied for Medicaid (2 weeks ago) and has applied for the assistance through [facility] system. Has appointment on 1/23 with social security office, but is not sure if she is applying for social security or disability (is still working 17 hours/week).

## 2016-10-18 ENCOUNTER — Encounter: Payer: Self-pay | Admitting: Hematology

## 2016-10-18 NOTE — Progress Notes (Signed)
Received staff message on 10/17/16 from [place] regarding financial concerns with needing a Y-90 emobilization treatment in IR at [facility].  I called over to IR at [facility] and spoke with Tiffany who advised of cost and that it needed to go through hospital assistance.  After reviewing the notes, I discussed with my team lead Darlena before sending an email to [facility] for Patient Accounting) with the information I received and inquiring about her financial assistance application.  Larene Beach responded back today via email along with approval letter for 100% discount 09/26/16-03/26/17. She was curious as to whom provided the patient with the information that she couldn't have the procedure done. I advised Larene Beach that I did not speak with patient directly, but began working on the situation once I received the staff message. I also explained the conversation I had with Tiffany in IR.

## 2016-10-19 ENCOUNTER — Encounter: Payer: Self-pay | Admitting: Hematology

## 2016-10-19 ENCOUNTER — Telehealth: Payer: Self-pay | Admitting: *Deleted

## 2016-10-19 NOTE — Telephone Encounter (Signed)
Return call from [place] at [facility]: they accept self-pay/GCCN discount card, so she suggested navigator call Anderson Malta at [facility] IR who schedules the Y-90 treatments. Called Anderson Malta in IR [phone]) and left detailed message regarding question of scheduling Y-90 in regards to her payer status and requested return call to discuss after she investigates.

## 2016-10-19 NOTE — Progress Notes (Signed)
Patient left a voicemail regarding Y-90 procedure. Called [name] to inquire. [name] awaiting a return call from [facility].  Called patient to advise her of the approval for [facility] FAA of 100% and she would be receiving a letter in the mail. Advised patient this covers services billed by [facility] and that services such as labs and radiology services are billed outside of [facility] for the processing and procedures and [name] had reached out to someone to see how and if the procedure could be done with this being the case. Patient was still a Kail confused because she said the procedure is being done at [facility]. Tried to explain again but patient still seemed confused. Advised patient [name] is working on details and would let her know of the result. Patient also wanted to know if she would have to repeat labs if procedure approved. Advised patient this would be a clinical question for the rn or dr. Patient verbalized understanding and has my name and number for any additional financial questions or concerns.

## 2016-10-19 NOTE — Telephone Encounter (Signed)
Oncology Nurse Navigator Documentation  Oncology Nurse Navigator Flowsheets 10/19/2016  Navigator Location [facility]  Referral date to RadOnc/MedOnc -  Navigator Encounter Type Telephone  Telephone Incoming Call;Outgoing Call  Abnormal Finding Date -  Confirmed Diagnosis Date -  Multidisiplinary Clinic Date -  Patient Visit Type -  Treatment Phase Pre-Tx/Tx Discussion  Barriers/Navigation Needs Coordination of Care--Y-90 liver treatment indicated  Education -  Interventions Coordination of Care--left VM with manager (Ginger) at [facility] [phone] to call navigator to discuss her situation  Referrals -  Coordination of Care -  Education Method -  Support Groups/Services -  Acuity Level 2  Time Spent with Patient 15  Patient called to report she is not able to schedule the Y-90 due to no insurance. Has the [place] and has applied for Medicaid. Worried that cancer will spread before she can have treatment. Called her back and left message that I am waiting to hear from manager and will call her back after discussion.

## 2016-10-21 ENCOUNTER — Telehealth: Payer: Self-pay | Admitting: *Deleted

## 2016-10-21 NOTE — Telephone Encounter (Signed)
Left VM requesting update on status of scheduling her Y-90 and what navigator can do to assist. Left direct # to return call to navigator.

## 2016-10-24 ENCOUNTER — Telehealth: Payer: Self-pay | Admitting: *Deleted

## 2016-10-24 NOTE — Telephone Encounter (Signed)
Left Vm with IR department requesting conversation about getting her Y-90 scheduled and what is the barrier to getting this scheduled.

## 2016-10-25 ENCOUNTER — Telehealth: Payer: Self-pay | Admitting: *Deleted

## 2016-10-25 NOTE — Telephone Encounter (Signed)
Per Dr. Burr Medico: needs to come in next week to discuss new treatment plan. Called patient and she agrees to see Dr. Burr Medico on 11/03/16 at 11:00.

## 2016-10-25 NOTE — Telephone Encounter (Signed)
Oncology Nurse Navigator Documentation  Oncology Nurse Navigator Flowsheets 10/25/2016  Navigator Location [facility]  Referral date to RadOnc/MedOnc -  Navigator Encounter Type Telephone  Telephone Outgoing Call  Abnormal Finding Date -  Confirmed Diagnosis Date -  Multidisiplinary Clinic Date -  Patient Visit Type -  Treatment Phase -  Barriers/Navigation Needs Coordination of Care--made patient aware that even with Medicaid approval, [place] Medicaid does not cover Sirtex treatment.  Education -  Interventions Coordination of Care--will discuss w/MD what the next step is.  Referrals -  Coordination of Care -  Education Method -  Support Groups/Services -  Acuity -  Time Spent with Patient 15  Return call today from [place] at [facility]: Was able to determine that Medicaid and the [facility] discount card will cover hospital charges and MD charges, but not the Y-90 medication and this is $25,000 for one dose. Also [place] Medicaid does not cover the drug per the [facility]. Notified Dr. Burr Medico.

## 2016-10-31 ENCOUNTER — Telehealth: Payer: Self-pay | Admitting: Hematology

## 2016-10-31 NOTE — Telephone Encounter (Signed)
lvm to inform pt of time change for 2/8 appt at 315 pm per LOS

## 2016-11-01 NOTE — Progress Notes (Signed)
[facility]  Telephone:([phone] Fax:([phone]) [phone]  Clinic Follow Up Note   Patient Care Team: Veatrice Bourbon, MD as PCP - General (Family Medicine) 11/03/2016  CHIEF COMPLAINTS:  Follow up for hepatocellular carcinoma  Oncology History   Hepatocellular carcinoma ([place])   Staging form: Liver (Excluding Intrahepatic Bile Ducts), AJCC 7th Edition   - Clinical stage from 08/31/2016: Stage II (T2, N0, M0) - Signed by Truitt Merle, MD on 09/09/2016      Hepatocellular carcinoma ([place])   07/29/2016 Tumor Marker    AFP 11.1      08/31/2016 Initial Diagnosis    Hepatocellular carcinoma ([place])      08/31/2016 Imaging    Liver MRI w wo contrast showed progressive multifocal hypervascular tumor within the anterior right hepatic lobe, consistent with [place]. 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 Stable nonspecific prominent lymph nodes within the gastrohepatic ligament and upper retroperitoneum. Mildly increased ascites.       10/11/2016 Imaging    US Renal Ultrasound appearance of the kidneys is within normal limits. Partially visualized liver masses.   Small amount of ascites        HISTORY OF PRESENTING ILLNESS:  Lisa [name] 61 y.o. female is here because of her recently diagnosed hepatocellular carcinoma. She is accompanied by her daughter and granddaughter to my clinic today.  She was found to have Hep C in 2006 when she was incarcerated, and she finally received Hep C treatment and was successful last year under the care of Dr. Linus Salmons.  She was found to have liver cirrhosis last year, no history of mucositis, GI bleeding, or other complications. She does not have a gastroenterologist.  During her routine follow-up, her abdominal ultrasound in May 2017 showed cirrhotic change, hypoechoic masslike region in the right lobe measuring 2.3 cm. She underwent liver MRI in May 2017, which showed a 1.7 cm lesion in the right hepatic lobe, with faint rim of  arterial phase enhancement, with intermediate probability for [facility]. 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  She has had RUQ pain for the past year, worse lately, she also complains of fatigue, sleeps a lot, able to function at home. She has good appetite, she has gained 5 lbs lately she lives with her daughter now.   CURRENT THERAPY: pending   INTERIM HISTORY: She returns for follow up. She is doing okay today. Her neighbor drove her here today. She still has a lot of abdominal and back pain that she ranks as a 8/10. She takes Tramadol for this, but it doesn't help much until she takes 3 of them. She went to the ED recently and she was given 20 tablets of oxycodone, which she takes at home. 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 She has been using marijuana lately, and is drinking about 2 40's a week and then more for special events like the Super Bowl. She has been trying to cut back on cigarettes. She saw the IR, who said they can't do her procedure because medicaid will not cover it. Pt is very often cold. Denies any other concerns.   MEDICAL HISTORY:  Past Medical History:  Diagnosis Date  . Alcohol abuse, daily use   . Cirrhosis of liver ([place])   . Helicobacter pylori gastritis   . Hepatitis C   . Hepatocellular carcinoma ([place]) dx'd 08/2016  . HTN (hypertension)   .  Hx of cardiovascular stress test    Lexiscan Myoview 3/16: No ischemia, EF 66%, normal study  . Leg pain    ABIs 3/16:  normal   . Mitral valve prolapse   . Osteopetrosis   . Poor dental hygiene   . Tobacco use     SURGICAL HISTORY: Past Surgical History:  Procedure Laterality Date  . ABDOMINAL HYSTERECTOMY    . CARPAL TUNNEL RELEASE    . IR GENERIC HISTORICAL  09/28/2016   IR RADIOLOGIST EVAL & MGMT 09/28/2016 Sandi Mariscal, MD [facility] INTERV RAD  . LAPAROSCOPY    . rod in right arm      SOCIAL  HISTORY: Social History   Social History  . Marital status: Divorced    Spouse name: N/A  . Number of children: 1  . Years of education: N/A   Occupational History  . 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 Smoking status: Current Every Day Smoker    Packs/day: 0.50    Years: 46.00    Types: Cigarettes    Start date: 09/26/1969  . Smokeless tobacco: Never Used     Comment: has cut back about 1/2 pk per day  . Alcohol use 25.2 oz/week    42 Standard drinks or equivalent per week     Comment: 40 oz beer a day   . Drug use: Yes    Types: Marijuana, Cocaine     Comment: Still using marijuana.  Past history of IV drug use  . Sexual activity: Not Currently   Other Topics Concern  . Not on file   Social History Narrative   Lives alone in [place] in an apartment. 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 No current source of income. Goes to [employer] for help as needed.   One daughter, age 42. Six grandchildren, one great-grand child.     Rides bus for transportation    FAMILY HISTORY: Family History  Problem Relation Age of Onset  . Cancer Father     Prostate  . Heart disease Mother   . Stroke Sister   . Heart disease Sister   . Lupus Sister   . Heart attack Neg Hx   . Colon cancer Neg Hx   . Colon polyps Neg Hx     ALLERGIES:  is allergic to celecoxib and sulfonamide derivatives.  MEDICATIONS:  Current Outpatient Prescriptions  Medication Sig Dispense Refill  . amLODipine (NORVASC) 10 MG tablet Take 1 tablet (10 mg total) by mouth daily. 90 tablet 2  . aspirin 81 MG tablet Take 81 mg by mouth daily.    Marland Kitchen docusate sodium (COLACE) 100 MG capsule Take 1 capsule (100 mg total) by mouth 2 (two) times daily. 60 capsule 1  . ferrous sulfate 325 (65 FE) MG tablet Take 1 tablet (325 mg total) by mouth daily with breakfast. 90 tablet 3  . folic acid (FOLVITE) 1 MG tablet Take 1 tablet (1 mg total)  by mouth daily. 60 tablet 0  . hydrochlorothiazide (HYDRODIURIL) 25 MG tablet Take 1 tablet (25 mg total) by mouth daily. 90 tablet 2  . hydrOXYzine (ATARAX/VISTARIL) 10 MG tablet Take 1 tablet (10 mg total) by mouth 3 (three) times daily as needed for anxiety (or sleep). 90 tablet 2  . lisinopril (PRINIVIL,ZESTRIL) 20 MG tablet Take 1 tablet (20 mg total) by mouth daily. (Patient taking differently: Take 40 mg by mouth daily. )  90 tablet 2  . methocarbamol (ROBAXIN) 500 MG tablet Take 1 tablet (500 mg total) by mouth 2 (two) times daily. 20 tablet 0  . mometasone-formoterol (DULERA) 200-5 MCG/ACT AERO Inhale 1 puff into the lungs 2 (two) times daily. (Patient taking differently: Inhale 1 puff into the lungs 2 (two) times daily as needed for wheezing or shortness of breath. ) 1 Inhaler 0  . naphazoline-pheniramine (NAPHCON-A) 0.025-0.3 % ophthalmic solution Place 1 drop into both eyes every 4 (four) hours as needed for irritation. 15 mL 1  . oxyCODONE (ROXICODONE) 5 MG immediate release tablet Take 1 tablet (5 mg total) by mouth every 4 (four) hours as needed for severe pain. 10 tablet 0  . Thiamine HCl (VITAMIN B-1) 250 MG tablet Take 1 tablet (250 mg total) by mouth daily. 30 tablet 4  . traMADol (ULTRAM) 50 MG tablet Take 1 tablet (50 mg total) by mouth every 8 (eight) hours as needed. 60 tablet 2  . vitamin B-12 (CYANOCOBALAMIN) 250 MCG tablet Take 1 tablet (250 mcg total) by mouth daily. (Patient taking differently: Take 500 mcg by mouth daily. ) 30 tablet 4  . potassium chloride SA (K-DUR,KLOR-CON) 20 MEQ tablet Take 1 tablet (20 mEq total) by mouth 2 (two) times daily. 30 tablet 1   No current facility-administered medications for this visit.     REVIEW OF SYSTEMS:   Constitutional: Denies fevers, chills or abnormal night sweats (+) chills Eyes: Denies blurriness of vision, double vision or watery eyes Ears, nose, mouth, throat, and face: Denies mucositis or sore throat Respiratory: Denies  cough, dyspnea or wheezes Cardiovascular: Denies palpitation, chest discomfort or lower extremity swelling Gastrointestinal:  Denies nausea, heartburn or change in bowel habits (+) abdominal pain Skin: Denies abnormal skin rashes Lymphatics: Denies new lymphadenopathy or easy bruising Neurological:Denies numbness, tingling or new weaknesses Behavioral/Psych: Mood is stable, no new changes  Musculoskeletal: (+) back pain All other systems were reviewed with the patient and are negative.  PHYSICAL EXAMINATION: ECOG PERFORMANCE STATUS: 1 - Symptomatic but completely ambulatory  Vitals:   11/03/16 1626  BP: (!) 158/76  Pulse: 85  Resp: 18  Temp: 98.6 F (37 C)   Filed Weights   11/03/16 1626  Weight: 113 lb 12.8 oz (51.6 kg)   GENERAL:alert, no distress and comfortable SKIN: skin color, texture, turgor are normal, no rashes or significant lesions EYES: normal, conjunctiva are pink and non-injected, sclera clear OROPHARYNX:no exudate, no erythema and lips, buccal mucosa, and tongue normal  NECK: supple, thyroid normal size, non-tender, without nodularity LYMPH:  no palpable lymphadenopathy in the cervical, axillary or inguinal LUNGS: clear to auscultation and percussion with normal breathing effort HEART: regular rate & rhythm and no murmurs and no lower extremity edema ABDOMEN:abdomen soft, mild tenderness at RUQ, (+) palpable liver, 2-3cm below rib cage, mild tender, firm, No other palpable abdominal mass, normal bowel sounds Musculoskeletal:no cyanosis of digits and no clubbing  PSYCH: alert & oriented x 3 with fluent speech NEURO: no focal motor/sensory deficits  LABORATORY DATA:  I have reviewed the data as listed CBC Latest Ref Rng & Units 11/03/2016 10/11/2016 07/29/2016  WBC 3.9 - 10.3 10e3/uL 5.0 5.9 6.5  Hemoglobin 11.6 - 15.9 g/dL 12.9 12.4 12.7  Hematocrit 34.8 - 46.6 % 39.1 36.9 37.0  Platelets 145 - 400 10e3/uL 229 207 259   CMP Latest Ref Rng & Units 11/03/2016  10/11/2016 09/29/2016  Glucose 70 - 140 mg/dl 89 76 -  BUN 7.0 - 26.0 mg/dL  7.2 9 -  Creatinine 0.6 - 1.1 mg/dL 0.8 0.71 0.55  Sodium 136 - 145 mEq/L 138 137 -  Potassium 3.5 - 5.1 mEq/L 3.1(L) 3.9 -  Chloride 101 - 111 mmol/L - 106 -  CO2 22 - 29 mEq/L 29 22 -  Calcium 8.4 - 10.4 mg/dL 9.1 8.8(L) -  Total Protein 6.4 - 8.3 g/dL 9.0(H) 8.2(H) 8.3(H)  Total Bilirubin 0.20 - 1.20 mg/dL 1.53(H) 1.0 1.3(H)  Alkaline Phos 40 - 150 U/L 94 81 72  AST 5 - 34 U/L 57(H) 74(H) 69(H)  ALT 0 - 55 U/L 21 28 27    AFP 07/29/2016: 11.1 09/01/2016: 15 11/03/2016: 15.7  PATHOLOGY REPORT  None   RADIOGRAPHIC STUDIES: I have personally reviewed the radiological images as listed and agreed with the findings in the report. US Renal  Result Date: 10/11/2016 CLINICAL DATA:  Right flank lower back pain EXAM: RENAL / URINARY TRACT ULTRASOUND COMPLETE COMPARISON:  CT 09/29/2016 FINDINGS: Right Kidney: Length: 11.7 cm. Echogenicity within normal limits. No mass or hydronephrosis visualized. Left Kidney: Length: 11 cm. Echogenicity within normal limits. No mass or hydronephrosis visualized. Bladder: Appears normal for degree of bladder distention. Increased hepatic echogenicity with partially visualized known liver mass. Small amount of ascites. IMPRESSION: Ultrasound appearance of the kidneys is within normal limits. Partially visualized liver masses.  Small amount of ascites Electronically Signed   By: Donavan Foil M.D.   On: 10/11/2016 20:04    ASSESSMENT & PLAN:  61 y.o. African-American female, with past medical history of polysubstance abuse, hepatitis C, liver cirrhosis, presented with right abdominal pain, MRI showed multifocal right lobe liver masses.  1. Hepatocellular carcinoma, multifocal, T2N0Mx, stage II  -I previously reviewed her imaging findings, laboratory results including AFP, and her diagnosis and staging. -Her previous MRI findings is consistent with multifocal hepatocellular carcinoma, giving  her underlying hepatitis C, liver cirrhosis, elevated AFP, I think her diagnosis is quite certain. I don't think he needs needle biopsied  to confirm the diagnosis. I will obtain a CT chest and a bone scan to rule out distant metastasis. -I presented her case in our GI tumor board  -We previously discussed the treatment option of liver transplant versus liver targeted therapy for his [place]. Giving the multifocal disease, and her size of the tumor, motor substance abuse, she is probably not a candidate for surgical resection or liver transplant. Patient is not a very interesting surgery also. -She would be a good candidate for liver targeted therapy if staging scan shows no distant deficits. 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 She was seen by IR and Y90 was offered, unfortunately her insurance does not cover Y90 and liver targeted therapy is not available. -We again discussed the systemic therapy options, which includes sorafenib, immunotherapy with Nivolumab, or chemotherapy.  In general, [place] is not very sensitive to chemotherapy. -due to relatively poor tolerance of Sorafenib,  I would offer her Nivolumab as first-line therapy, potential side effects, which includes but not limited to, fatigue, skin rash, hypothyroidism, other endocrine disorders, pneumonitis, etc., were discussed with patient in details. She will discuss with her family and make a decision. I have printed out information on these options. She is unsure what she wants to do at this point.  -Repeat scan before we start treatment.  -Labs reviewed, potassium is low. I have prescribed her a potassium pill.  2. Multiple substance abuse -Smoking, alcohol, cocaine, and marijuana -I strongly encouraged her to stop the above, however she is not very interested. She states she self-medicates with those substances due to her pain and low  appetite. -She is still smoking marijuana. She has cut back on smoking cigarettes and drinking alcohol.  -I again encouraged her to stop using the above substances, especially when she is on treatment.  3. Abdominal pain -Probably related to her liver cancer. -Due to her multiple substance abuse, I strongly encouraged her to see a pain specialist. I would be happy to refer her, she agrees, but doesn't know if she'll actually go. -I will not prescribe narcotic pain medication or BZ  -Is not relieved until she takes 3 Tramadol  4. HTN -f/u with PCP -Low potassium due to this medication.   5. Hepatitis C, treated  -I encourage her to follow up with Dr. Linus Salmons   Plan -Ordered repeat CT abd/pel scans with contrast in the next few weeks  -Prescribe potassium supplement. -I'll see him back in 2 weeks to make a final decision on treatment    All questions were answered. The patient knows to call the clinic with any problems, questions or concerns.  I spent 25 minutes counseling the patient face to face. The total time spent in the appointment was 30 minutes and more than 50% was on counseling.  This document serves as a record of services personally performed by Truitt Merle, MD. It was created on her behalf by Martinique Casey, a trained medical scribe. The creation of this record is based on the scribe's personal observations and the provider's statements to them. This document has been checked and approved by the attending provider.  I have reviewed the above documentation for accuracy and completeness and I agree with the above.   Truitt Merle, MD 11/03/2016

## 2016-11-03 ENCOUNTER — Ambulatory Visit (HOSPITAL_BASED_OUTPATIENT_CLINIC_OR_DEPARTMENT_OTHER): Payer: No Typology Code available for payment source | Admitting: Hematology

## 2016-11-03 ENCOUNTER — Other Ambulatory Visit (HOSPITAL_BASED_OUTPATIENT_CLINIC_OR_DEPARTMENT_OTHER): Payer: No Typology Code available for payment source

## 2016-11-03 VITALS — BP 158/76 | HR 85 | Temp 98.6°F | Resp 18 | Ht 63.0 in | Wt 113.8 lb

## 2016-11-03 DIAGNOSIS — I10 Essential (primary) hypertension: Secondary | ICD-10-CM

## 2016-11-03 DIAGNOSIS — C220 Liver cell carcinoma: Secondary | ICD-10-CM

## 2016-11-03 DIAGNOSIS — B182 Chronic viral hepatitis C: Secondary | ICD-10-CM

## 2016-11-03 DIAGNOSIS — F1910 Other psychoactive substance abuse, uncomplicated: Secondary | ICD-10-CM

## 2016-11-03 DIAGNOSIS — Z720 Tobacco use: Secondary | ICD-10-CM

## 2016-11-03 DIAGNOSIS — K7460 Unspecified cirrhosis of liver: Secondary | ICD-10-CM

## 2016-11-03 LAB — CBC WITH DIFFERENTIAL/PLATELET
BASO%: 0.7 % (ref 0.0–2.0)
BASOS ABS: 0 10*3/uL (ref 0.0–0.1)
EOS%: 0.8 % (ref 0.0–7.0)
Eosinophils Absolute: 0 10*3/uL (ref 0.0–0.5)
HEMATOCRIT: 39.1 % (ref 34.8–46.6)
HEMOGLOBIN: 12.9 g/dL (ref 11.6–15.9)
LYMPH#: 1.7 10*3/uL (ref 0.9–3.3)
LYMPH%: 33.4 % (ref 14.0–49.7)
MCH: 32.2 pg (ref 25.1–34.0)
MCHC: 33.1 g/dL (ref 31.5–36.0)
MCV: 97.3 fL (ref 79.5–101.0)
MONO#: 1.4 10*3/uL — AB (ref 0.1–0.9)
MONO%: 27.8 % — ABNORMAL HIGH (ref 0.0–14.0)
NEUT#: 1.9 10*3/uL (ref 1.5–6.5)
NEUT%: 37.3 % — ABNORMAL LOW (ref 38.4–76.8)
PLATELETS: 229 10*3/uL (ref 145–400)
RBC: 4.02 10*6/uL (ref 3.70–5.45)
RDW: 13.5 % (ref 11.2–14.5)
WBC: 5 10*3/uL (ref 3.9–10.3)

## 2016-11-03 LAB — COMPREHENSIVE METABOLIC PANEL
ALBUMIN: 3.3 g/dL — AB (ref 3.5–5.0)
ALK PHOS: 94 U/L (ref 40–150)
ALT: 21 U/L (ref 0–55)
ANION GAP: 9 meq/L (ref 3–11)
AST: 57 U/L — AB (ref 5–34)
BILIRUBIN TOTAL: 1.53 mg/dL — AB (ref 0.20–1.20)
BUN: 7.2 mg/dL (ref 7.0–26.0)
CALCIUM: 9.1 mg/dL (ref 8.4–10.4)
CO2: 29 mEq/L (ref 22–29)
CREATININE: 0.8 mg/dL (ref 0.6–1.1)
Chloride: 100 mEq/L (ref 98–109)
EGFR: 88 mL/min/{1.73_m2} — ABNORMAL LOW (ref >90)
Glucose: 89 mg/dl (ref 70–140)
Potassium: 3.1 mEq/L — ABNORMAL LOW (ref 3.5–5.1)
Sodium: 138 mEq/L (ref 136–145)
Total Protein: 9 g/dL — ABNORMAL HIGH (ref 6.4–8.3)

## 2016-11-03 MED ORDER — POTASSIUM CHLORIDE CRYS ER 20 MEQ PO TBCR: 20.0000 meq | EXTENDED_RELEASE_TABLET | Freq: Two times a day (BID) | ORAL | 1 refills | Status: AC

## 2016-11-04 ENCOUNTER — Encounter: Payer: Self-pay | Admitting: Hematology

## 2016-11-04 LAB — AFP TUMOR MARKER: AFP, Serum, Tumor Marker: 15.7 ng/mL — ABNORMAL HIGH (ref 0.0–8.3)

## 2016-11-10 ENCOUNTER — Ambulatory Visit (HOSPITAL_COMMUNITY)
Admission: RE | Admit: 2016-11-10 | Discharge: 2016-11-10 | Disposition: A | Discharge status: Home / Self Care | Payer: No Typology Code available for payment source | Source: Ambulatory Visit | Attending: Hematology | Admitting: Hematology

## 2016-11-10 ENCOUNTER — Encounter (HOSPITAL_COMMUNITY): Payer: Self-pay

## 2016-11-10 DIAGNOSIS — Z90710 Acquired absence of both cervix and uterus: Secondary | ICD-10-CM | POA: Insufficient documentation

## 2016-11-10 DIAGNOSIS — C220 Liver cell carcinoma: Secondary | ICD-10-CM | POA: Insufficient documentation

## 2016-11-10 DIAGNOSIS — R188 Other ascites: Secondary | ICD-10-CM | POA: Insufficient documentation

## 2016-11-10 DIAGNOSIS — R591 Generalized enlarged lymph nodes: Secondary | ICD-10-CM | POA: Insufficient documentation

## 2016-11-10 DIAGNOSIS — I252 Old myocardial infarction: Secondary | ICD-10-CM | POA: Insufficient documentation

## 2016-11-10 DIAGNOSIS — I517 Cardiomegaly: Secondary | ICD-10-CM | POA: Insufficient documentation

## 2016-11-10 DIAGNOSIS — I700 Atherosclerosis of aorta: Secondary | ICD-10-CM | POA: Insufficient documentation

## 2016-11-10 DIAGNOSIS — I313 Pericardial effusion (noninflammatory): Secondary | ICD-10-CM | POA: Insufficient documentation

## 2016-11-10 MED ORDER — IOPAMIDOL (ISOVUE-300) INJECTION 61%
INTRAVENOUS | Status: AC
Start: 2016-11-10 — End: 2016-11-10
  Administered 2016-11-10: 75 mL
  Filled 2016-11-10: qty 75

## 2016-11-10 MED ORDER — SODIUM CHLORIDE 0.9 % IJ SOLN
INTRAMUSCULAR | Status: AC
  Filled 2016-11-10: qty 50

## 2016-11-16 NOTE — Progress Notes (Signed)
[place]  [phone] [phone]  Clinic Follow Up Note   Patient Care Team: Veatrice Bourbon, MD as PCP - General (Family Medicine) 11/03/2016  CHIEF COMPLAINTS:  Follow up for hepatocellular carcinoma  Oncology History   Hepatocellular carcinoma ([place])   Staging form: Liver (Excluding Intrahepatic Bile Ducts), AJCC 7th Edition   - Clinical stage from 08/31/2016: Stage II (T2, N0, M0) - Signed by Truitt Merle, MD on 09/09/2016      Hepatocellular carcinoma ([place])   07/29/2016 Tumor Marker    AFP 11.1      08/31/2016 Initial Diagnosis    Hepatocellular carcinoma ([place])      08/31/2016 Imaging    Liver MRI w wo contrast showed progressive multifocal hypervascular tumor within the anterior right hepatic lobe, consistent with Fleming-Neon. 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 Stable nonspecific prominent lymph nodes within the gastrohepatic ligament and upper retroperitoneum. Mildly increased ascites.       10/11/2016 Imaging    US Renal Ultrasound appearance of the kidneys is within normal limits. Partially visualized liver masses.   Small amount of ascites      11/10/2016 Imaging    CT Abdomen Pelvis w/ Contrast 1. The previously noted lesion in segment 5 of the liver is similar to slightly larger than the prior examination, while the other lesion is not as well seen. However, the other more central lesion which is located predominantly in segment 8 demonstrates increasing tumor thrombus extension into the portal venous system involving essentially the entire right-sided portal venous system, with some early extension into the left portal vein system on today's examination. These findings indicate progression of disease. In addition, there is worsening retroperitoneal and upper abdominal lymphadenopathy.  2. Trace volume of perihepatic ascites.  3. Small pericardial effusion.  4. Cardiomegaly.  5. Aortic atherosclerosis.        HISTORY  OF PRESENTING ILLNESS:  Lisa [name] 61 y.o. female is here because of her recently diagnosed hepatocellular carcinoma. She is accompanied by her daughter and granddaughter to my clinic today.  She was found to have Hep C in 2006 when she was incarcerated, and she finally received Hep C treatment and was successful last year under the care of Dr. Linus Salmons.  She was found to have liver cirrhosis last year, no history of mucositis, GI bleeding, or other complications. She does not have a gastroenterologist.  During her routine follow-up, her abdominal ultrasound in May 2017 showed cirrhotic change, hypoechoic masslike region in the right lobe measuring 2.3 cm. She underwent liver MRI in May 2017, which showed a 1.7 cm lesion in the right hepatic lobe, with faint rim of arterial phase enhancement, with intermediate probability for [facility]. 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  She has had RUQ pain for the past year, worse lately, she also complains of fatigue, sleeps a lot, able to function at home. She has good appetite, she has gained 5 lbs lately she lives with her daughter now.   CURRENT THERAPY: pending Nivolumab   INTERIM HISTORY: She returns for follow up. She feels okay today. She is stressed, depressed, and having a lot of anxiety over what treatment she will need to have. She has some pain in her right groin that is always present, but worse when she walks. She does have osteopenia. She has been having nausea every time  she eats. She has a hard time sleeping as well. Denies any other concerns.   MEDICAL HISTORY:  Past Medical History:  Diagnosis Date  . Alcohol abuse, daily use   . Cirrhosis of liver ([place])   . Helicobacter pylori gastritis   . Hepatitis C   . Hepatocellular carcinoma ([place]) dx'd 08/2016  . HTN (hypertension)   . Hx of cardiovascular stress  test    Lexiscan Myoview 3/16: No ischemia, EF 66%, normal study  . Leg pain    ABIs 3/16:  normal   . Mitral valve prolapse   . Osteopetrosis   . Poor dental hygiene   . Tobacco use     SURGICAL HISTORY: Past Surgical History:  Procedure Laterality Date  . ABDOMINAL HYSTERECTOMY    . CARPAL TUNNEL RELEASE    . IR GENERIC HISTORICAL  09/28/2016   IR RADIOLOGIST EVAL & MGMT 09/28/2016 Sandi Mariscal, MD [facility] INTERV RAD  . LAPAROSCOPY    . rod in right arm      SOCIAL HISTORY: Social History   Social History  . Marital status: Divorced    Spouse name: N/A  . Number of children: 1  . Years of education: N/A   Occupational History  . personal care asst [employer] History Main Topics  . Smoking status: Current Every Day Smoker    Packs/day: 0.50    Years: 46.00    Types: Cigarettes    Start date: 09/26/1969  . Smokeless tobacco: Never Used     Comment: has cut back about 1/2 pk per day  . Alcohol use 25.2 oz/week    42 Standard drinks or equivalent per week     Comment: 40 oz beer a day   . Drug use: Yes    Types: Marijuana, Cocaine     Comment: Still using marijuana.  Past history of IV drug use  . Sexual activity: Not Currently   Other Topics Concern  . Not on file   Social History Narrative   Lives alone in [place] in an apartment. 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 No current source of income. Goes to [employer] for help as needed.   One daughter, age 35. Six grandchildren, one great-grand child.     Rides bus for transportation    FAMILY HISTORY: Family History  Problem Relation Age of Onset  . Cancer Father     Prostate  . Heart disease Mother   . Stroke Sister   . Heart disease Sister   . Lupus Sister   . Heart attack Neg Hx   . Colon cancer Neg Hx   . Colon polyps Neg Hx     ALLERGIES:  is allergic to celecoxib and sulfonamide derivatives.  MEDICATIONS:  Current Outpatient  Prescriptions  Medication Sig Dispense Refill  . amLODipine (NORVASC) 10 MG tablet Take 1 tablet (10 mg total) by mouth daily. 90 tablet 2  . aspirin 81 MG tablet Take 81 mg by mouth daily.    Marland Kitchen docusate sodium (COLACE) 100 MG capsule Take 1 capsule (100 mg total) by mouth 2 (two) times daily. 60 capsule 1  . ferrous sulfate 325 (65 FE) MG tablet Take 1 tablet (325 mg total) by mouth daily with breakfast. 90 tablet 3  . folic acid (FOLVITE) 1 MG tablet Take 1 tablet (1 mg total) by mouth daily. 60 tablet 0  . hydrochlorothiazide (HYDRODIURIL) 25 MG tablet Take 1  tablet (25 mg total) by mouth daily. 90 tablet 2  . hydrOXYzine (ATARAX/VISTARIL) 10 MG tablet Take 1 tablet (10 mg total) by mouth 3 (three) times daily as needed for anxiety (or sleep). 90 tablet 2  . lisinopril (PRINIVIL,ZESTRIL) 20 MG tablet Take 1 tablet (20 mg total) by mouth daily. (Patient taking differently: Take 40 mg by mouth daily. ) 90 tablet 2  . methocarbamol (ROBAXIN) 500 MG tablet Take 1 tablet (500 mg total) by mouth 2 (two) times daily. 20 tablet 0  . mometasone-formoterol (DULERA) 200-5 MCG/ACT AERO Inhale 1 puff into the lungs 2 (two) times daily. (Patient taking differently: Inhale 1 puff into the lungs 2 (two) times daily as needed for wheezing or shortness of breath. ) 1 Inhaler 0  . naphazoline-pheniramine (NAPHCON-A) 0.025-0.3 % ophthalmic solution Place 1 drop into both eyes every 4 (four) hours as needed for irritation. 15 mL 1  . oxyCODONE (ROXICODONE) 5 MG immediate release tablet Take 1 tablet (5 mg total) by mouth every 4 (four) hours as needed for severe pain. 10 tablet 0  . potassium chloride SA (K-DUR,KLOR-CON) 20 MEQ tablet Take 1 tablet (20 mEq total) by mouth 2 (two) times daily. 30 tablet 1  . prochlorperazine (COMPAZINE) 10 MG tablet Take 1 tablet (10 mg total) by mouth every 6 (six) hours as needed for nausea or vomiting. 30 tablet 0  . Thiamine HCl (VITAMIN B-1) 250 MG tablet Take 1 tablet (250 mg  total) by mouth daily. 30 tablet 4  . traMADol (ULTRAM) 50 MG tablet Take 1 tablet (50 mg total) by mouth every 8 (eight) hours as needed. 60 tablet 2  . vitamin B-12 (CYANOCOBALAMIN) 250 MCG tablet Take 1 tablet (250 mcg total) by mouth daily. (Patient taking differently: Take 500 mcg by mouth daily. ) 30 tablet 4   No current facility-administered medications for this visit.     REVIEW OF SYSTEMS:   Constitutional: Denies fevers, chills or abnormal night sweats  Eyes: Denies blurriness of vision, double vision or watery eyes Ears, nose, mouth, throat, and face: Denies mucositis or sore throat Respiratory: Denies cough, dyspnea or wheezes Cardiovascular: Denies palpitation, chest discomfort or lower extremity swelling Gastrointestinal:  Denies heartburn or change in bowel habits (+) nausea Skin: Denies abnormal skin rashes Lymphatics: Denies new lymphadenopathy or easy bruising Neurological:Denies numbness, tingling or new weaknesses Behavioral/Psych: Mood is stable, no new changes (+) depression, anxiety, insomnia Musculoskeletal: (+) right groin pain All other systems were reviewed with the patient and are negative.  PHYSICAL EXAMINATION: ECOG PERFORMANCE STATUS: 1 - Symptomatic but completely ambulatory  Vitals:   11/17/16 1338  BP: 140/73  Pulse: 76  Resp: 17  Temp: 98.1 F (36.7 C)   Filed Weights   11/17/16 1338  Weight: 116 lb 14.4 oz (53 kg)    GENERAL:alert, no distress and comfortable SKIN: skin color, texture, turgor are normal, no rashes or significant lesions EYES: normal, conjunctiva are pink and non-injected, sclera clear OROPHARYNX:no exudate, no erythema and lips, buccal mucosa, and tongue normal  NECK: supple, thyroid normal size, non-tender, without nodularity LYMPH:  no palpable lymphadenopathy in the cervical, axillary or inguinal LUNGS: clear to auscultation and percussion with normal breathing effort HEART: regular rate & rhythm and no murmurs and no  lower extremity edema ABDOMEN:abdomen soft, mild tenderness at RUQ, (+) palpable liver, 2-3cm below rib cage, mild tender, firm, No other palpable abdominal mass, normal bowel sounds Musculoskeletal:no cyanosis of digits and no clubbing  PSYCH: alert &  oriented x 3 with fluent speech NEURO: no focal motor/sensory deficits  LABORATORY DATA:  I have reviewed the data as listed CBC Latest Ref Rng & Units 11/03/2016 10/11/2016 07/29/2016  WBC 3.9 - 10.3 10e3/uL 5.0 5.9 6.5  Hemoglobin 11.6 - 15.9 g/dL 12.9 12.4 12.7  Hematocrit 34.8 - 46.6 % 39.1 36.9 37.0  Platelets 145 - 400 10e3/uL 229 207 259   CMP Latest Ref Rng & Units 11/03/2016 10/11/2016 09/29/2016  Glucose 70 - 140 mg/dl 89 76 -  BUN 7.0 - 26.0 mg/dL 7.2 9 -  Creatinine 0.6 - 1.1 mg/dL 0.8 0.71 0.55  Sodium 136 - 145 mEq/L 138 137 -  Potassium 3.5 - 5.1 mEq/L 3.1(L) 3.9 -  Chloride 101 - 111 mmol/L - 106 -  CO2 22 - 29 mEq/L 29 22 -  Calcium 8.4 - 10.4 mg/dL 9.1 8.8(L) -  Total Protein 6.4 - 8.3 g/dL 9.0(H) 8.2(H) 8.3(H)  Total Bilirubin 0.20 - 1.20 mg/dL 1.53(H) 1.0 1.3(H)  Alkaline Phos 40 - 150 U/L 94 81 72  AST 5 - 34 U/L 57(H) 74(H) 69(H)  ALT 0 - 55 U/L 21 28 27    AFP 07/29/2016: 11.1 09/01/2016: 15 11/03/2016: 15.7  PATHOLOGY REPORT  None   RADIOGRAPHIC STUDIES: I have personally reviewed the radiological images as listed and agreed with the findings in the report. Ct Abdomen Pelvis W Contrast  Result Date: 11/10/2016 CLINICAL DATA:  61 year old female with history of hepatocellular carcinoma. Restaging examination. EXAM: CT ABDOMEN AND PELVIS WITH CONTRAST TECHNIQUE: Multidetector CT imaging of the abdomen and pelvis was performed using the standard protocol following bolus administration of intravenous contrast. CONTRAST:  94mL ISOVUE-300 IOPAMIDOL (ISOVUE-300) INJECTION 61% COMPARISON:  CT the abdomen and pelvis 09/29/2016. FINDINGS: Lower chest: Multifocal scarring throughout the visualize lung bases. Mild cardiomegaly.  Rounding of the apex of the left ventricle, likely related to scarring from prior distal LAD territory myocardial infarction. Small pericardial effusion. Hepatobiliary: Markedly nodular contour of the liver again demonstrated, compatible with underlying cirrhosis. Several hypervascular areas are again noted within the liver. There is a considerable difference in contrast enhancement between today's examination and the prior study from 09/29/2016. When directly compared with the prior study in a similar fashion, the previously noted lesion in segment 5 of the liver currently measures 3.8 x 3.3 cm (previously 3.7 x 2.9 cm) on image 32 of coronal series 5. The previously described central lesion is no longer clearly delineated (image 39 of series 5) when compared to the prior study. However, there does appear to be a greater extent of tumor thrombus extending from this lesion into the portal vein, which is best appreciated on axial image 23 of series 2. The bulk of this tumor thrombus is in the right portal vein, although some thrombus extends into the proximal aspect of the left portal vein. On axial image 19 of series 2 there is a new somewhat rounded mass-like appearing area of hypervascularity with some internal hypovascular components which appear to branch (likely occluded venous branches). This is favored to reflect a perfusion anomaly, although some underlying tumor is also likely present in this region. No intra or extrahepatic biliary ductal dilatation. Gallbladder is unremarkable in appearance. Pancreas: No pancreatic mass. No pancreatic ductal dilatation. No pancreatic or peripancreatic fluid or inflammatory changes. Spleen: Spleen is surgically absent. Adrenals/Urinary Tract: Subcentimeter low-attenuation lesion in the lower pole of left kidney is too small to definitively characterize, but statistically likely a tiny cyst. Right kidney and bilateral adrenal  glands are normal in appearance. No  hydroureteronephrosis. Urinary bladder is nearly completely decompressed, but otherwise unremarkable in appearance. Stomach/Bowel: The appearance of the stomach is normal. There is no pathologic dilatation of small bowel or colon. Normal appendix. Vascular/Lymphatic: Aortic atherosclerosis, without evidence of aneurysm or dissection in the abdominal or pelvic vasculature. There are numerous borderline enlarged and mildly enlarged retroperitoneal lymph nodes measuring up to 1 cm in short axis (axial image 35 of series 2) in the left para-aortic nodal station. Aortocaval lymph node measuring 12 mm in short axis (image 27 of series 2) slightly increased compared to the prior examination. This appears slightly increased compared to the prior examination. There also multiple borderline enlarged mesenteric lymph nodes throughout the root of the small bowel mesenteries where there is also some mild edema. Reproductive: Status post hysterectomy. Ovaries are not confidently identified may be surgically absent or atrophic. Other: Trace volume of perihepatic ascites.  No pneumoperitoneum. Musculoskeletal: There are no aggressive appearing lytic or blastic lesions noted in the visualized portions of the skeleton. IMPRESSION: 1. The previously noted lesion in segment 5 of the liver is similar to slightly larger than the prior examination, while the other lesion is not as well seen. However, the other more central lesion which is located predominantly in segment 8 demonstrates increasing tumor thrombus extension into the portal venous system involving essentially the entire right-sided portal venous system, with some early extension into the left portal vein system on today's examination. These findings indicate progression of disease. In addition, there is worsening retroperitoneal and upper abdominal lymphadenopathy. 2. Trace volume of perihepatic ascites. 3. Small pericardial effusion. 4. Cardiomegaly. 5. Aortic  atherosclerosis. 6. Additional incidental findings, as above. Electronically Signed   By: Vinnie Langton M.D.   On: 11/10/2016 16:30    ASSESSMENT & PLAN:  61 y.o. African-American female, with past medical history of polysubstance abuse, hepatitis C, liver cirrhosis, presented with right abdominal pain, MRI showed multifocal right lobe liver masses.  1. Hepatocellular carcinoma, multifocal, T2N0Mx, stage II  -I previously reviewed her imaging findings, laboratory results including AFP, and her diagnosis and staging. -Her previous MRI findings is consistent with multifocal hepatocellular carcinoma, giving her underlying hepatitis C, liver cirrhosis, elevated AFP, I think her diagnosis is quite certain. I don't think he needs needle biopsied  to confirm the diagnosis. This is also the consensus form our GI tumor board  -We previously discussed the treatment option of liver transplant versus liver targeted therapy for his [place]. Giving the multifocal disease, and her size of the tumor, motor substance abuse, she is probably not a candidate for surgical resection or liver transplant. Patient is not a very interesting surgery also. -She would be a good candidate for liver targeted therapy. She was seen by IR and Y90 was offered, unfortunately her insurance does not cover Y90 and liver targeted therapy is not available. -We again discussed the systemic therapy options, which includes sorafenib, immunotherapy with Nivolumab, or chemotherapy.  In general, Erda is not very sensitive to chemotherapy. -I reviewed her recent restaging CT from 11/10/2016 which showed disease progression in liver  -due to relatively poor tolerance of Sorafenib,  I would offer her Nivolumab as first-line therapy, potential side effects, which includes but not limited to, fatigue, skin rash, hypothyroidism, other endocrine disorders, pneumonitis, etc., were again discussed with patient in details. She agrees to proceed -the goal of  therapy is palliative, not curable  - Start next week. Treatment every 2 weeks.  -If  she responds well to treatment, I will see her every other treatment, once a month.  -Repeat scans after 3 months of tretment.  -The option of port-a-cath placement was review with the patient, including discussion on the advantages and disadvantages of having a port-a-cath placed. After discussion, the patient declined port placement for now.    2. Multiple substance abuse -Smoking, alcohol, cocaine, and marijuana -I strongly encouraged her to stop the above, however she is not very interested. She states she self-medicates with those substances due to her pain and low appetite. -She is still smoking marijuana. She has cut back on smoking cigarettes and drinking alcohol.  -I again encouraged her to stop using the above substances, especially when she is on treatment.  3. Abdominal pain -Probably related to her liver cancer. -Due to her multiple substance abuse, I strongly encouraged her to see a pain specialist. I would be happy to refer her. -I will not prescribe narcotic pain medication or BZ  -she takes tramadol as needed   4. HTN -f/u with PCP -Low potassium due to this medication HCTZ.   5. Hepatitis C, treated  -I encourage her to follow up with Dr. Linus Salmons   6. Hypokalemia  -Labs reviewed. Her potassium is low again. She has not taken her potassium pills due to finances, but she has been eating 2 bananas a day. She will try to get the over the counter potassium.   7. Portal vein tumor thrombosis -chronic, secondary to [facility] -We discussed the benefit and risk of anticoagulation. Due to the chronic thrombosis, high risk of bleeding with anticoagulation due to portal hypertension, I think the risk of anticoagulation is overweight the benefit   8. Financial Stress -She can not afford drugs that have higher co-pays.  -She will talk to our financial office.  -She is applying to medicaid, but she  still has transportation problems. -She will talk to our Education officer, museum.   9.Goal of care discussion  -We again discussed the incurable nature of her cancer without surgery, and the overall poor prognosis, especially if she does not have good response to chemotherapy or progress on chemo -The patient understands the goal of care is palliative. -I recommend DNR/DNI, she will think about it   Plan -Prescribe Compazine for her nausea.  -Start Nivolumab next week and continue every 2 weeks  -I'll see her back in 3 weeks   All questions were answered. The patient knows to call the clinic with any problems, questions or concerns.  I spent 25 minutes counseling the patient face to face. The total time spent in the appointment was 30 minutes and more than 50% was on counseling.  This document serves as a record of services personally performed by Truitt Merle, MD. It was created on her behalf by Martinique Casey, a trained medical scribe. The creation of this record is based on the scribe's personal observations and the provider's statements to them. This document has been checked and approved by the attending provider.  I have reviewed the above documentation for accuracy and completeness and I agree with the above.   Truitt Merle, MD 11/17/2016

## 2016-11-17 ENCOUNTER — Telehealth: Payer: Self-pay | Admitting: *Deleted

## 2016-11-17 ENCOUNTER — Ambulatory Visit (HOSPITAL_BASED_OUTPATIENT_CLINIC_OR_DEPARTMENT_OTHER): Payer: No Typology Code available for payment source | Admitting: Hematology

## 2016-11-17 ENCOUNTER — Encounter: Payer: Self-pay | Admitting: Hematology

## 2016-11-17 ENCOUNTER — Telehealth: Payer: Self-pay | Admitting: Hematology

## 2016-11-17 VITALS — BP 140/73 | HR 76 | Temp 98.1°F | Resp 17 | Ht 63.0 in | Wt 116.9 lb

## 2016-11-17 DIAGNOSIS — K7460 Unspecified cirrhosis of liver: Secondary | ICD-10-CM

## 2016-11-17 DIAGNOSIS — F1910 Other psychoactive substance abuse, uncomplicated: Secondary | ICD-10-CM

## 2016-11-17 DIAGNOSIS — C220 Liver cell carcinoma: Secondary | ICD-10-CM

## 2016-11-17 DIAGNOSIS — Z7189 Other specified counseling: Secondary | ICD-10-CM | POA: Insufficient documentation

## 2016-11-17 DIAGNOSIS — B182 Chronic viral hepatitis C: Secondary | ICD-10-CM

## 2016-11-17 MED ORDER — PROCHLORPERAZINE MALEATE 10 MG PO TABS: 10.0000 mg | ORAL_TABLET | Freq: Four times a day (QID) | ORAL | 0 refills | Status: AC | PRN

## 2016-11-17 MED FILL — PROCHLORPERAZINE 10 MG TAB: 10 | 7 days supply | Qty: 30 | Fill #0

## 2016-11-17 NOTE — Progress Notes (Signed)
START OFF PATHWAY REGIMEN - [Other Dx]   ZX:[id number] 3 mg/kg q14 Days:   A cycle is every 14 days:     Nivolumab        Dose Mod: None  **Always confirm dose/schedule in your pharmacy ordering system**    Intent of Therapy: Non-Curative / Palliative Intent, Discussed with Patient

## 2016-11-17 NOTE — Telephone Encounter (Signed)
Lab, NIVOLUMAB and follow up with Dr Burr Medico, scheduled per 11/17/16 los. Patient was given a copy of the AVS report and appointment schedule per 11/17/16 los.

## 2016-11-17 NOTE — Progress Notes (Signed)
ALERT: A disease instance has been permanently removed from this patient's pathway record and replaced with a new disease instance. Information on the new disease instance will be transmitted in a separate message.  Disease Being Removed: [Other Dx]  Reason for Removal: Reason not listed 

## 2016-11-17 NOTE — Telephone Encounter (Signed)
Left message with Loren Racer SW that pt wants to see SW to discuss stress.

## 2016-11-18 ENCOUNTER — Encounter: Payer: Self-pay | Admitting: Hematology

## 2016-11-18 ENCOUNTER — Encounter: Payer: Self-pay | Admitting: *Deleted

## 2016-11-18 NOTE — Progress Notes (Signed)
Returned patient's voicemail to schedule an appointment for financial concerns. Patient asked if she could meet with me on 11/24/16 when she comes back in. Advised patient that would be fine and if she could come at 11 before her appointment to meet with me. Patient states she could and I put in an appointment for her for that time. Advised patient to bring proof of income when she comes. Patient verbalized understanding and has my card for any additional financial questions or concerns.

## 2016-11-18 NOTE — Progress Notes (Signed)
[place] Work  Clinical Social Work was referred by nurse for assessment of psychosocial needs.  Clinical Social Worker contacted  patient via phone at home to offer support and assess for needs. CSW has met with pt at [facility] and other appts as well. Pt shared nervousness and anxiety due to now undergoing chemo. CSW validated emotions and provided supportive listening. Pt would like ongoing support. CSW to check in at chemo next week and follow as needed.     Clinical Social Work interventions:  Supportive listening  Loren Racer, LCSW, OSW-C Clinical Social Worker [place]  [place] Phone: [phone] Fax: [phone]

## 2016-11-24 ENCOUNTER — Encounter: Payer: Self-pay | Admitting: Hematology

## 2016-11-24 ENCOUNTER — Encounter: Payer: Self-pay | Admitting: *Deleted

## 2016-11-24 ENCOUNTER — Ambulatory Visit: Payer: No Typology Code available for payment source

## 2016-11-24 ENCOUNTER — Ambulatory Visit (HOSPITAL_BASED_OUTPATIENT_CLINIC_OR_DEPARTMENT_OTHER): Payer: No Typology Code available for payment source

## 2016-11-24 ENCOUNTER — Telehealth: Payer: Self-pay | Admitting: Student

## 2016-11-24 ENCOUNTER — Other Ambulatory Visit (HOSPITAL_BASED_OUTPATIENT_CLINIC_OR_DEPARTMENT_OTHER): Payer: No Typology Code available for payment source

## 2016-11-24 VITALS — BP 189/74 | HR 80 | Temp 98.5°F | Resp 16

## 2016-11-24 DIAGNOSIS — Z5112 Encounter for antineoplastic immunotherapy: Secondary | ICD-10-CM

## 2016-11-24 DIAGNOSIS — Z79899 Other long term (current) drug therapy: Secondary | ICD-10-CM

## 2016-11-24 DIAGNOSIS — C220 Liver cell carcinoma: Secondary | ICD-10-CM

## 2016-11-24 LAB — CBC WITH DIFFERENTIAL/PLATELET
BASO%: 0.4 % (ref 0.0–2.0)
Basophils Absolute: 0 10*3/uL (ref 0.0–0.1)
EOS ABS: 0 10*3/uL (ref 0.0–0.5)
EOS%: 0.4 % (ref 0.0–7.0)
HCT: 40.1 % (ref 34.8–46.6)
HGB: 13.9 g/dL (ref 11.6–15.9)
LYMPH%: 30.1 % (ref 14.0–49.7)
MCH: 32.2 pg (ref 25.1–34.0)
MCHC: 34.7 g/dL (ref 31.5–36.0)
MCV: 92.8 fL (ref 79.5–101.0)
MONO#: 0.9 10*3/uL (ref 0.1–0.9)
MONO%: 17.6 % — AB (ref 0.0–14.0)
NEUT%: 51.5 % (ref 38.4–76.8)
NEUTROS ABS: 2.6 10*3/uL (ref 1.5–6.5)
PLATELETS: 256 10*3/uL (ref 145–400)
RBC: 4.32 10*6/uL (ref 3.70–5.45)
RDW: 15.5 % — ABNORMAL HIGH (ref 11.2–14.5)
WBC: 5 10*3/uL (ref 3.9–10.3)
lymph#: 1.5 10*3/uL (ref 0.9–3.3)
nRBC: 1 % — ABNORMAL HIGH (ref 0–0)

## 2016-11-24 LAB — COMPREHENSIVE METABOLIC PANEL
ALT: 30 U/L (ref 0–55)
ANION GAP: 6 meq/L (ref 3–11)
AST: 99 U/L — ABNORMAL HIGH (ref 5–34)
Albumin: 3.2 g/dL — ABNORMAL LOW (ref 3.5–5.0)
Alkaline Phosphatase: 114 U/L (ref 40–150)
BUN: 4.7 mg/dL — ABNORMAL LOW (ref 7.0–26.0)
CHLORIDE: 106 meq/L (ref 98–109)
CO2: 25 meq/L (ref 22–29)
CREATININE: 0.7 mg/dL (ref 0.6–1.1)
Calcium: 9.1 mg/dL (ref 8.4–10.4)
Glucose: 83 mg/dl (ref 70–140)
Potassium: 3.7 mEq/L (ref 3.5–5.1)
Sodium: 137 mEq/L (ref 136–145)
Total Bilirubin: 1.03 mg/dL (ref 0.20–1.20)
Total Protein: 8.8 g/dL — ABNORMAL HIGH (ref 6.4–8.3)

## 2016-11-24 LAB — TSH: TSH: 1.413 m[IU]/L (ref 0.308–3.960)

## 2016-11-24 MED ORDER — SODIUM CHLORIDE 0.9 % IV SOLN
Freq: Once | INTRAVENOUS | Status: AC
  Administered 2016-11-24: 14:00:00 via INTRAVENOUS

## 2016-11-24 MED ORDER — SODIUM CHLORIDE 0.9 % IV SOLN
240.0000 mg | Freq: Once | INTRAVENOUS | Status: AC
  Administered 2016-11-24: 240 mg via INTRAVENOUS
  Filled 2016-11-24: qty 20

## 2016-11-24 MED FILL — POTASSIUM CL ER 20 MEQ TAB: 20 | 15 days supply | Qty: 30 | Fill #0

## 2016-11-24 NOTE — Progress Notes (Signed)
Received completed application for Nivolumab(Obdivo) for [facility]. Faxed to [facility]. Fax received ok per confirmation sheet.

## 2016-11-24 NOTE — Patient Instructions (Signed)
[facility] Discharge Instructions for Patients Receiving Chemotherapy  Today you received the following chemotherapy agent: Nivolumab.  To help prevent nausea and vomiting after your treatment, we encourage you to take your nausea medication as prescribed.   If you develop nausea and vomiting that is not controlled by your nausea medication, call the clinic.   BELOW ARE SYMPTOMS THAT SHOULD BE REPORTED IMMEDIATELY:  *FEVER GREATER THAN 100.5 F  *CHILLS WITH OR WITHOUT FEVER  NAUSEA AND VOMITING THAT IS NOT CONTROLLED WITH YOUR NAUSEA MEDICATION  *UNUSUAL SHORTNESS OF BREATH  *UNUSUAL BRUISING OR BLEEDING  TENDERNESS IN MOUTH AND THROAT WITH OR WITHOUT PRESENCE OF ULCERS  *URINARY PROBLEMS  *BOWEL PROBLEMS  UNUSUAL RASH Items with * indicate a potential emergency and should be followed up as soon as possible.  Feel free to call the clinic you have any questions or concerns. The clinic phone number is [phone].  Please show the [place] at check-in to the Emergency Department and triage nurse.    Nivolumab injection What is this medicine? NIVOLUMAB (nye VOL ue mab) is a monoclonal antibody. It is used to treat melanoma, lung cancer, kidney cancer, head and neck cancer, Hodgkin lymphoma, urothelial cancer, colon cancer, and liver cancer. This medicine may be used for other purposes; ask your health care provider or pharmacist if you have questions. COMMON BRAND NAME(S): Opdivo What should I tell my health care provider before I take this medicine? They need to know if you have any of these conditions: -diabetes -immune system problems -kidney disease -liver disease -lung disease -organ transplant -stomach or intestine problems -thyroid disease -an unusual or allergic reaction to nivolumab, other medicines, foods, dyes, or preservatives -pregnant or trying to get pregnant -breast-feeding How should I use this medicine? This medicine is  for infusion into a vein. It is given by a health care professional in a hospital or clinic setting. A special MedGuide will be given to you before each treatment. Be sure to read this information carefully each time. Talk to your pediatrician regarding the use of this medicine in children. While this drug may be prescribed for children as young as 12 years for selected conditions, precautions do apply. Overdosage: If you think you have taken too much of this medicine contact a poison control center or emergency room at once. NOTE: This medicine is only for you. Do not share this medicine with others. What if I miss a dose? It is important not to miss your dose. Call your doctor or health care professional if you are unable to keep an appointment. What may interact with this medicine? Interactions have not been studied. Give your health care provider a list of all the medicines, herbs, non-prescription drugs, or dietary supplements you use. Also tell them if you smoke, drink alcohol, or use illegal drugs. Some items may interact with your medicine. This list may not describe all possible interactions. Give your health care provider a list of all the medicines, herbs, non-prescription drugs, or dietary supplements you use. Also tell them if you smoke, drink alcohol, or use illegal drugs. Some items may interact with your medicine. What should I watch for while using this medicine? This drug may make you feel generally unwell. Continue your course of treatment even though you feel ill unless your doctor tells you to stop. You may need blood work done while you are taking this medicine. Do not become pregnant while taking this medicine or for 5 months after  stopping it. Women should inform their doctor if they wish to become pregnant or think they might be pregnant. There is a potential for serious side effects to an unborn child. Talk to your health care professional or pharmacist for more information.  Do not breast-feed an infant while taking this medicine. What side effects may I notice from receiving this medicine? Side effects that you should report to your doctor or health care professional as soon as possible: -allergic reactions like skin rash, itching or hives, swelling of the face, lips, or tongue -black, tarry stools -blood in the urine -bloody or watery diarrhea -changes in vision -change in sex drive -changes in emotions or moods -chest pain -confusion -cough -decreased appetite -diarrhea -facial flushing -feeling faint or lightheaded -fever, chills -hair loss -hallucination, loss of contact with reality -headache -irritable -joint pain -loss of memory -muscle pain -muscle weakness -seizures -shortness of breath -signs and symptoms of high blood sugar such as dizziness; dry mouth; dry skin; fruity breath; nausea; stomach pain; increased hunger or thirst; increased urination -signs and symptoms of kidney injury like trouble passing urine or change in the amount of urine -signs and symptoms of liver injury like dark yellow or brown urine; general ill feeling or flu-like symptoms; light-colored stools; loss of appetite; nausea; right upper belly pain; unusually weak or tired; yellowing of the eyes or skin -stiff neck -swelling of the ankles, feet, hands -weight gain Side effects that usually do not require medical attention (report to your doctor or health care professional if they continue or are bothersome): -bone pain -constipation -tiredness -vomiting This list may not describe all possible side effects. Call your doctor for medical advice about side effects. You may report side effects to FDA at 1-800-FDA-1088. Where should I keep my medicine? This drug is given in a hospital or clinic and will not be stored at home. NOTE: This sheet is a summary. It may not cover all possible information. If you have questions about this medicine, talk to your doctor,  pharmacist, or health care provider.  2018 Elsevier/Gold Standard (2016-06-20 17:49:34)

## 2016-11-24 NOTE — Progress Notes (Signed)
Met with patient and family members to introduce myself as her Estate manager/land agent and to  discuss financial concerns and options. Asked patient about her [employer] renewal due to her card showing expired on 11/17/16. Patient states she did not realize that and had not seen her PCP. Advised patient that she may want to contact them if she is interested in re-enrolling. Patient verbalized understanding.  Patient brought in her proof of income. Patient approved for one-time $400 [place]. Gave patient a list of expenses it covers. Called OP pharmacy to have her rx pulled from [place] so that she may pick up both prescriptions and use her grant when she leaves here. Patient was very Patent attorney.  Received heads up from [employer] in pharmacy to enroll patient in [employer] Access Support for Nivolumab and to call for Rob to shadow and I did just that. I introduced Rob to the patient and explained that he was training. Had patient to sign application and I completed the demographic information. Had patient to go back to lobby to check in for lab.   Gave Rob information for pharmacy to complete and bring back. Obtained physician signature, Will fax complete application once received to [employer]. It takes about two days to process and a decision to be made.

## 2016-11-24 NOTE — Progress Notes (Signed)
Per Dr. Burr Medico, ok to treat with elevated BP today.

## 2016-11-25 ENCOUNTER — Encounter: Payer: Self-pay | Admitting: Internal Medicine

## 2016-11-25 ENCOUNTER — Ambulatory Visit (INDEPENDENT_AMBULATORY_CARE_PROVIDER_SITE_OTHER): Payer: Medicaid Other | Admitting: Internal Medicine

## 2016-11-25 ENCOUNTER — Encounter: Payer: Self-pay | Admitting: Hematology

## 2016-11-25 VITALS — BP 130/60 | HR 99 | Temp 98.3°F | Ht 63.0 in | Wt 118.0 lb

## 2016-11-25 DIAGNOSIS — K5900 Constipation, unspecified: Secondary | ICD-10-CM

## 2016-11-25 DIAGNOSIS — I10 Essential (primary) hypertension: Secondary | ICD-10-CM

## 2016-11-25 MED ORDER — LISINOPRIL 20 MG PO TABS: 20.0000 mg | ORAL_TABLET | Freq: Every day | ORAL | 2 refills | Status: AC

## 2016-11-25 MED ORDER — DOCUSATE SODIUM 100 MG PO CAPS: 100.0000 mg | ORAL_CAPSULE | Freq: Two times a day (BID) | ORAL | 0 refills | Status: AC

## 2016-11-25 NOTE — Progress Notes (Signed)
Received call from [email or web address]  to obtain first two dates of service for Obdivo. I advised her that patient received one on yesterday and next 12/08/16. She states they do not go back, therefore I gave her next following date of 12/22/16. She states they will get those together for shipping.

## 2016-11-25 NOTE — Patient Instructions (Signed)
Your blood pressure is normal today when you have only been taking your Lisinopril. Therefore, let's hold off on restarting the Norvasc and HCTZ for now. PLEASE check your blood pressure at home in the next few days and call the clinic on Monday to tell Dr. Lincoln Brigham what your blood pressures are. Then she will decide if we should restart your other blood pressure medications. PLEASE follow up with Dr. Lincoln Brigham in 1 week.   For your constipation, you can start Colace. You decided to hold of on Miralax.  Please make an appointment with your GI doctor to get a colonoscopy.

## 2016-11-25 NOTE — Progress Notes (Signed)
   [facility] Phone: [phone]   Date of Visit: 11/25/2016   HPI:  Lisa [name] is a 61 y.o. female presenting to clinic today for same day appointment. PCP: Marina Goodell, MD Concerns today include:  HTN:  - patient reports that she has been out of Norvasc and HCTZ for 2 weeks. She is still taking Lisinopril 20mg  daily.  - denies HA, blurred vision, chest pain - does not check BP at home.   Constipation:  - reports of not having a good BM in 2 weeks. Reports she had a small hard BM yesterday. Then she took her friend's laxative today and just had a BM a few minutes ago. This BM was soft.  - reports that she has liver cancer and had her first chemotherapy yesterday  - reports she feels bloated  - reports of history of constipation years ago but she has not needed to take anything until recently.  - no blood in her stool, no dark tarry stool - last colonoscopy was in 2013 at [facility]. The study was normal but recommended follow up in 5 years.   ROS: See HPI.  [place]:  HTN Mitral Regurgitation Hepatocellular carcinoma Chronic Hep C  PHYSICAL EXAM: BP 130/60   Pulse 99   Temp 98.3 F (36.8 C) (Oral)   Ht 5\' 3"  (1.6 m)   Wt 118 lb (53.5 kg)   SpO2 98%   BMI 20.90 kg/m  GEN: NAD CV: RRR, systolic murmur noted  PULM: CTAB, normal effort ABD: Soft, palpable liver. Mild tenderness diffusely (reports this is chronic) without rebound, + bowel sounds  SKIN: No rash or cyanosis; warm and well-perfused EXTR: No lower extremity edema or calf tenderness PSYCH: Mood and affect euthymic, normal rate and volume of speech NEURO: Awake, alert, no focal deficits grossly, normal speech   ASSESSMENT/PLAN: Constipation, unspecified constipation type Discussed about starting Miralax or restarting Colace. Patient would like to start Colace first. Colace prescribed. She had a normal colonoscopy per chart review in 2013, but the report states to repeat  colonoscopy in 5 years (i am not sure why). I asked her to make a follow up appointment with her GI doctor for colonoscopy.    HYPERTENSION, BENIGN Blood Pressure is well controlled today. Patient has only been taking Lisinopril 20mg  daily and had run out of Norvasc and HCTZ for 2 weeks. Per chart review, she also had a normal BP at a recent office visit during the past 2 weeks. Therefore, asked patient to not restart Norvasc and HCTZ for now but to continue Lisinopril. Asked patient to monitor BP at a local pharmacy for the next few days and call back clinic on Monday with BPs to see if we need to restart the other medications. Also asked patient to follow up with Dr. Lincoln Brigham in 1 week.    Smiley Houseman, MD PGY [facility]

## 2016-11-25 NOTE — Progress Notes (Signed)
[place] Work  Clinical Social Work was referred by need for CSW follow up for re-assessment of psychosocial needs and check in at first chemo.  Clinical Social Worker met with patient and her sister at [facility] during first treatment. Pt was tearful today upon arrival to start chemo, but accompanied by her sister and friend Minette Brine, who is a fellow patient here at [facility]  CSW reviewed common emotions, coping techniques and support programs/services to assist. CSW provided safe space for pt to share emotions and process situation. Pt in better spirits once her chemo was going. CSW and pt completed SCAT as transportation is a concern for her. Pt aware to contact CSW as needed.    Clinical Social Work interventions:  Supportive listening and support Resource education and referral  Loren Racer, LCSW, OSW-C Clinical Social Worker [place]  [place] Phone: [phone] Fax: [phone]

## 2016-11-25 NOTE — Progress Notes (Signed)
Received approval from [employer] for Nivolumab(Obdivo).  Patient approved 11/25/16-11/25/17. Emailed to Drug Replacement Communication Team. Copy placed to be scanned by HIM. Copy placed  In book.

## 2016-11-25 NOTE — Assessment & Plan Note (Signed)
Blood Pressure is well controlled today. Patient has only been taking Lisinopril 20mg  daily and had run out of Norvasc and HCTZ for 2 weeks. Per chart review, she also had a normal BP at a recent office visit during the past 2 weeks. Therefore, asked patient to not restart Norvasc and HCTZ for now but to continue Lisinopril. Asked patient to monitor BP at a local pharmacy for the next few days and call back clinic on Monday with BPs to see if we need to restart the other medications. Also asked patient to follow up with Dr. Lincoln Brigham in 1 week.

## 2016-11-28 ENCOUNTER — Telehealth: Payer: Self-pay

## 2016-11-28 NOTE — Telephone Encounter (Signed)
She is doing fine a [place] fatigue. She is working on getting her colonoscopy and working with disability and it is stressful. Taking colace that pcp prescribed. Is a [place] nauseated, discussed compazine. Discussed drinking water.

## 2016-11-28 NOTE — Telephone Encounter (Signed)
-----   Message from Veverly Fells, RN sent at 11/24/2016  3:46 PM EST ----- Regarding: First-time follow-up MD Richardson Dopp: [phone] A patient of MD Adline Potter, received Opdivo for the first time today. She tolerated her treatment well with no complaints. Her phone number is [phone]

## 2016-11-29 ENCOUNTER — Encounter: Payer: Self-pay | Admitting: Pharmacy Technician

## 2016-11-29 NOTE — Progress Notes (Signed)
[facility] Counseling Note  Called pt per Abby Potash Hock's/LCSW referral. LVM introducing self and [facility]'s services. Will f/u on 3/7.  [place] Pam, [place] [facility] [phone]

## 2016-11-29 NOTE — Progress Notes (Unsigned)
Opdivo assistance drug was ordered on 11/25/16. Rec'd 4x 100 mg and 2x 40 mg on 11/29/16.

## 2016-11-30 ENCOUNTER — Telehealth: Payer: Self-pay | Admitting: Student

## 2016-11-30 NOTE — Telephone Encounter (Signed)
Pt would like the results form the colonscopy she had 5 yrs at [facility] faxed to [facility]  [phone].  She also needs a referral faxed also. She will be seeing Dr Fuller Plan.

## 2016-11-30 NOTE — Telephone Encounter (Signed)
Spoke with patient and informed her that she would need to sign her release of information with [facility] since the last colonoscopy was at [facility] and can only be viewed through care everywhere but not able to print from this option.  Also advised patient that this way she can get the information directly to [facility] and we wouldn't have to scan it here and then fax it to them.  Patient voiced understanding and will go by their office to sign this release of information instead of signing it here.  Will forward to MD to place referral.  Lisa [name]

## 2016-12-06 NOTE — Progress Notes (Signed)
[place]  [phone] [phone]  Clinic Follow Up Note   Patient Care Team: Veatrice Bourbon, MD as PCP - General (Family Medicine) 12/08/2016   CHIEF COMPLAINTS:  Follow up for hepatocellular carcinoma  Oncology History   Hepatocellular carcinoma ([place])   Staging form: Liver (Excluding Intrahepatic Bile Ducts), AJCC 7th Edition   - Clinical stage from 08/31/2016: Stage II (T2, N0, M0) - Signed by Truitt Merle, MD on 09/09/2016      Hepatocellular carcinoma ([place])   07/29/2016 Tumor Marker    AFP 11.1      08/31/2016 Initial Diagnosis    Hepatocellular carcinoma ([place])      08/31/2016 Imaging    Liver MRI w wo contrast showed progressive multifocal hypervascular tumor within the anterior right hepatic lobe, consistent with [place]. 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 Stable nonspecific prominent lymph nodes within the gastrohepatic ligament and upper retroperitoneum. Mildly increased ascites.       10/11/2016 Imaging    US Renal Ultrasound appearance of the kidneys is within normal limits. Partially visualized liver masses.   Small amount of ascites      10/11/2016 - 10/11/2016 Hospital Admission    Admit date: 10/11/16 Patient reported to ED for right-sided flank pain ongoing for 2 days.      11/10/2016 Imaging    CT Abdomen Pelvis w/ Contrast 1. The previously noted lesion in segment 5 of the liver is similar to slightly larger than the prior examination, while the other lesion is not as well seen. However, the other more central lesion which is located predominantly in segment 8 demonstrates increasing tumor thrombus extension into the portal venous system involving essentially the entire right-sided portal venous system, with some early extension into the left portal vein system on today's examination. These findings indicate progression of disease. In addition, there is worsening retroperitoneal and upper abdominal  lymphadenopathy.  2. Trace volume of perihepatic ascites.  3. Small pericardial effusion.  4. Cardiomegaly.  5. Aortic atherosclerosis.      11/24/2016 -  Chemotherapy    Nivolumab 240mg  every 2 weeks          HISTORY OF PRESENTING ILLNESS:  Lisa [name] 61 y.o. female is here because of her recently diagnosed hepatocellular carcinoma. She is accompanied by her daughter and granddaughter to my clinic today.  She was found to have Hep C in 2006 when she was incarcerated, and she finally received Hep C treatment and was successful last year under the care of Dr. Linus Salmons.  She was found to have liver cirrhosis last year, no history of mucositis, GI bleeding, or other complications. She does not have a gastroenterologist.  During her routine follow-up, her abdominal ultrasound in May 2017 showed cirrhotic change, hypoechoic masslike region in the right lobe measuring 2.3 cm. She underwent liver MRI in May 2017, which showed a 1.7 cm lesion in the right hepatic lobe, with faint rim of arterial phase enhancement, with intermediate probability for [facility]. 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  She has had RUQ pain for the past year, worse lately, she also complains of fatigue, sleeps a lot, able to function at home. She has good appetite, she has gained 5 lbs lately she lives with her daughter now.   Current therapy: Nivolumab, every 2 weeks   INTERIM HISTORY: She returns for follow up.  She is doing okay. She reports that her breast are "firm" with no pain. Denies redness and tenderness. She is experiencing shortness of breath and her PCP has given her an inhaler, which helps. Potassium levels are 3.6 today. She reports she is constipated.    MEDICAL HISTORY:  Past Medical History:  Diagnosis Date  . Alcohol abuse, daily use   . Cirrhosis of liver ([place])   .  Helicobacter pylori gastritis   . Hepatitis C   . Hepatocellular carcinoma ([place]) dx'd 08/2016  . HTN (hypertension)   . Hx of cardiovascular stress test    Lexiscan Myoview 3/16: No ischemia, EF 66%, normal study  . Leg pain    ABIs 3/16:  normal   . Mitral valve prolapse   . Osteopetrosis   . Poor dental hygiene   . Tobacco use     SURGICAL HISTORY: Past Surgical History:  Procedure Laterality Date  . ABDOMINAL HYSTERECTOMY    . CARPAL TUNNEL RELEASE    . IR GENERIC HISTORICAL  09/28/2016   IR RADIOLOGIST EVAL & MGMT 09/28/2016 Sandi Mariscal, MD [facility] INTERV RAD  . LAPAROSCOPY    . rod in right arm      SOCIAL HISTORY: Social History   Social History  . Marital status: Divorced    Spouse name: N/A  . Number of children: 1  . Years of education: N/A   Occupational History  . personal care asst [employer] History Main Topics  . Smoking status: Current Every Day Smoker    Packs/day: 0.50    Years: 46.00    Types: Cigarettes    Start date: 09/26/1969  . Smokeless tobacco: Never Used     Comment: has cut back about 1/2 pk per day  . Alcohol use 25.2 oz/week    42 Standard drinks or equivalent per week     Comment: 40 oz beer a day   . Drug use: Yes    Types: Marijuana, Cocaine     Comment: Still using marijuana.  Past history of IV drug use  . Sexual activity: Not Currently   Other Topics Concern  . Not on file   Social History Narrative   Lives alone in [place] in an apartment. 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 No current source of income. Goes to [employer] for help as needed.   One daughter, age 37. Six grandchildren, one great-grand child.     Rides bus for transportation    FAMILY HISTORY: Family History  Problem Relation Age of Onset  . Cancer Father     Prostate  . Heart disease Mother   . Stroke Sister   . Heart disease Sister   . Lupus Sister   . Heart attack Neg Hx   . Colon  cancer Neg Hx   . Colon polyps Neg Hx     ALLERGIES:  is allergic to celecoxib and sulfonamide derivatives.  MEDICATIONS:  Current Outpatient Prescriptions  Medication Sig Dispense Refill  . amLODipine (NORVASC) 10 MG tablet Take 1 tablet (10 mg total) by mouth daily. 90 tablet 2  . aspirin 81 MG tablet Take 81 mg by mouth daily.    Marland Kitchen docusate sodium (COLACE) 100 MG capsule Take 1 capsule (100 mg total) by mouth 2 (two) times daily. 60 capsule 0  . ferrous sulfate 325 (65 FE) MG tablet Take 1 tablet (325 mg total) by mouth daily with breakfast. 90  tablet 3  . folic acid (FOLVITE) 1 MG tablet Take 1 tablet (1 mg total) by mouth daily. 60 tablet 0  . hydrochlorothiazide (HYDRODIURIL) 25 MG tablet Take 1 tablet (25 mg total) by mouth daily. 90 tablet 2  . hydrOXYzine (ATARAX/VISTARIL) 10 MG tablet Take 1 tablet (10 mg total) by mouth 3 (three) times daily as needed for anxiety (or sleep). 90 tablet 2  . lisinopril (PRINIVIL,ZESTRIL) 20 MG tablet Take 1 tablet (20 mg total) by mouth daily. 90 tablet 2  . methocarbamol (ROBAXIN) 500 MG tablet Take 1 tablet (500 mg total) by mouth 2 (two) times daily. 20 tablet 0  . mometasone-formoterol (DULERA) 200-5 MCG/ACT AERO Inhale 1 puff into the lungs 2 (two) times daily. 1 Inhaler 0  . naphazoline-pheniramine (NAPHCON-A) 0.025-0.3 % ophthalmic solution Place 1 drop into both eyes every 4 (four) hours as needed for irritation. 15 mL 1  . oxyCODONE (ROXICODONE) 5 MG immediate release tablet Take 1 tablet (5 mg total) by mouth every 4 (four) hours as needed for severe pain. 10 tablet 0  . Thiamine HCl (VITAMIN B-1) 250 MG tablet Take 1 tablet (250 mg total) by mouth daily. 30 tablet 4  . vitamin B-12 (CYANOCOBALAMIN) 250 MCG tablet Take 1 tablet (250 mcg total) by mouth daily. (Patient taking differently: Take 500 mcg by mouth daily. ) 30 tablet 4  . potassium chloride SA (K-DUR,KLOR-CON) 20 MEQ tablet Take 1 tablet (20 mEq total) by mouth 2 (two) times  daily. (Patient not taking: Reported on 12/08/2016) 30 tablet 1  . prochlorperazine (COMPAZINE) 10 MG tablet Take 1 tablet (10 mg total) by mouth every 6 (six) hours as needed for nausea or vomiting. (Patient not taking: Reported on 12/07/2016) 30 tablet 0  . traMADol (ULTRAM) 50 MG tablet Take 1 tablet (50 mg total) by mouth every 6 (six) hours as needed. 40 tablet 0   No current facility-administered medications for this visit.     REVIEW OF SYSTEMS:   Constitutional: Denies fevers, chills or abnormal night sweats  (+) fatigue Eyes: Denies blurriness of vision, double vision or watery eyes Ears, nose, mouth, throat, and face: Denies mucositis or sore throat Respiratory: Denies dyspnea or wheezes (+)SOB, cough Cardiovascular: Denies palpitation, chest discomfort or lower extremity swelling Gastrointestinal:  Denies heartburn (+)constipation, gas Skin: Denies abnormal skin rashes Lymphatics: Denies new lymphadenopathy or easy bruising Neurological:Denies numbness, tingling or new weaknesses Behavioral/Psych: Mood is stable, no new changes Musculoskeletal: (+) knee and pain All other systems were reviewed with the patient and are negative.  PHYSICAL EXAMINATION:  ECOG PERFORMANCE STATUS: 1 - Symptomatic but completely ambulatory  Vitals:   12/08/16 1347  BP: (!) 157/63  Pulse: 83  Resp: 18  Temp: 98 F (36.7 C)   Filed Weights   12/08/16 1347  Weight: 123 lb 6.4 oz (56 kg)    GENERAL:alert, no distress and comfortable SKIN: skin color, texture, turgor are normal, no rashes or significant lesions EYES: normal, conjunctiva are pink and non-injected, sclera clear OROPHARYNX:no exudate, no erythema and lips, buccal mucosa, and tongue normal  NECK: supple, thyroid normal size, non-tender, without nodularity LYMPH:  no palpable lymphadenopathy in the cervical, axillary or inguinal LUNGS: clear to auscultation and percussion with normal breathing effort HEART: regular rate & rhythm  and no murmurs and no lower extremity edema ABDOMEN:abdomen soft, mild tenderness at RUQ, No other palpable abdominal mass, normal bowel sounds Musculoskeletal:no cyanosis of digits and no clubbing  PSYCH: alert & oriented x 3  with fluent speech NEURO: no focal motor/sensory deficits  LABORATORY DATA:  I have reviewed the data as listed CBC Latest Ref Rng & Units 12/08/2016 11/24/2016 11/03/2016  WBC 3.9 - 10.3 10e3/uL 5.1 5.0 5.0  Hemoglobin 11.6 - 15.9 g/dL 14.0 13.9 12.9  Hematocrit 34.8 - 46.6 % 40.7 40.1 39.1  Platelets 145 - 400 10e3/uL 191 256 229   CMP Latest Ref Rng & Units 12/08/2016 11/24/2016 11/03/2016  Glucose 70 - 140 mg/dl 109 83 89  BUN 7.0 - 26.0 mg/dL 6.6(L) 4.7(L) 7.2  Creatinine 0.6 - 1.1 mg/dL 0.7 0.7 0.8  Sodium 136 - 145 mEq/L 136 137 138  Potassium 3.5 - 5.1 mEq/L 3.6 3.7 3.1(L)  Chloride 101 - 111 mmol/L - - -  CO2 22 - 29 mEq/L 24 25 29   Calcium 8.4 - 10.4 mg/dL 8.6 9.1 9.1  Total Protein 6.4 - 8.3 g/dL 7.9 8.8(H) 9.0(H)  Total Bilirubin 0.20 - 1.20 mg/dL 1.29(H) 1.03 1.53(H)  Alkaline Phos 40 - 150 U/L 95 114 94  AST 5 - 34 U/L 56(H) 99(H) 57(H)  ALT 0 - 55 U/L 22 30 21    AFP 07/29/2016: 11.1 09/01/2016: 15 11/03/2016: 15.7   PATHOLOGY REPORT  None   RADIOGRAPHIC STUDIES: I have personally reviewed the radiological images as listed and agreed with the findings in the report. Ct Abdomen Pelvis W Contrast  Result Date: 11/10/2016 CLINICAL DATA:  61 year old female with history of hepatocellular carcinoma. Restaging examination. EXAM: CT ABDOMEN AND PELVIS WITH CONTRAST TECHNIQUE: Multidetector CT imaging of the abdomen and pelvis was performed using the standard protocol following bolus administration of intravenous contrast. CONTRAST:  82mL ISOVUE-300 IOPAMIDOL (ISOVUE-300) INJECTION 61% COMPARISON:  CT the abdomen and pelvis 09/29/2016. FINDINGS: Lower chest: Multifocal scarring throughout the visualize lung bases. Mild cardiomegaly. Rounding of the apex of the  left ventricle, likely related to scarring from prior distal LAD territory myocardial infarction. Small pericardial effusion. Hepatobiliary: Markedly nodular contour of the liver again demonstrated, compatible with underlying cirrhosis. Several hypervascular areas are again noted within the liver. There is a considerable difference in contrast enhancement between today's examination and the prior study from 09/29/2016. When directly compared with the prior study in a similar fashion, the previously noted lesion in segment 5 of the liver currently measures 3.8 x 3.3 cm (previously 3.7 x 2.9 cm) on image 32 of coronal series 5. The previously described central lesion is no longer clearly delineated (image 39 of series 5) when compared to the prior study. However, there does appear to be a greater extent of tumor thrombus extending from this lesion into the portal vein, which is best appreciated on axial image 23 of series 2. The bulk of this tumor thrombus is in the right portal vein, although some thrombus extends into the proximal aspect of the left portal vein. On axial image 19 of series 2 there is a new somewhat rounded mass-like appearing area of hypervascularity with some internal hypovascular components which appear to branch (likely occluded venous branches). This is favored to reflect a perfusion anomaly, although some underlying tumor is also likely present in this region. No intra or extrahepatic biliary ductal dilatation. Gallbladder is unremarkable in appearance. Pancreas: No pancreatic mass. No pancreatic ductal dilatation. No pancreatic or peripancreatic fluid or inflammatory changes. Spleen: Spleen is surgically absent. Adrenals/Urinary Tract: Subcentimeter low-attenuation lesion in the lower pole of left kidney is too small to definitively characterize, but statistically likely a tiny cyst. Right kidney and bilateral adrenal glands are  normal in appearance. No hydroureteronephrosis. Urinary bladder  is nearly completely decompressed, but otherwise unremarkable in appearance. Stomach/Bowel: The appearance of the stomach is normal. There is no pathologic dilatation of small bowel or colon. Normal appendix. Vascular/Lymphatic: Aortic atherosclerosis, without evidence of aneurysm or dissection in the abdominal or pelvic vasculature. There are numerous borderline enlarged and mildly enlarged retroperitoneal lymph nodes measuring up to 1 cm in short axis (axial image 35 of series 2) in the left para-aortic nodal station. Aortocaval lymph node measuring 12 mm in short axis (image 27 of series 2) slightly increased compared to the prior examination. This appears slightly increased compared to the prior examination. There also multiple borderline enlarged mesenteric lymph nodes throughout the root of the small bowel mesenteries where there is also some mild edema. Reproductive: Status post hysterectomy. Ovaries are not confidently identified may be surgically absent or atrophic. Other: Trace volume of perihepatic ascites.  No pneumoperitoneum. Musculoskeletal: There are no aggressive appearing lytic or blastic lesions noted in the visualized portions of the skeleton. IMPRESSION: 1. The previously noted lesion in segment 5 of the liver is similar to slightly larger than the prior examination, while the other lesion is not as well seen. However, the other more central lesion which is located predominantly in segment 8 demonstrates increasing tumor thrombus extension into the portal venous system involving essentially the entire right-sided portal venous system, with some early extension into the left portal vein system on today's examination. These findings indicate progression of disease. In addition, there is worsening retroperitoneal and upper abdominal lymphadenopathy. 2. Trace volume of perihepatic ascites. 3. Small pericardial effusion. 4. Cardiomegaly. 5. Aortic atherosclerosis. 6. Additional incidental findings,  as above. Electronically Signed   By: Vinnie Langton M.D.   On: 11/10/2016 16:30    ASSESSMENT & PLAN:  61 y.o. African-American female, with past medical history of polysubstance abuse, hepatitis C, liver cirrhosis, presented with right abdominal pain, MRI showed multifocal right lobe liver masses.  1. Hepatocellular carcinoma, multifocal, T2N0Mx, stage II  -I previously reviewed her imaging findings, laboratory results including AFP, and her diagnosis and staging. -Her previous MRI findings is consistent with multifocal hepatocellular carcinoma, giving her underlying hepatitis C, liver cirrhosis, elevated AFP, I think her diagnosis is quite certain. I don't think he needs needle biopsied  to confirm the diagnosis. This is also the consensus form our GI tumor board  -We previously discussed the treatment option of liver transplant versus liver targeted therapy for his [place]. Giving the multifocal disease, and her size of the tumor, motor substance abuse, she is probably not a candidate for surgical resection or liver transplant. Patient is not a very interesting surgery also. -She would be a good candidate for liver targeted therapy. She was seen by IR and Y90 was offered, unfortunately her insurance does not cover Y90 and liver targeted therapy is not available. -We again discussed the systemic therapy options, which includes sorafenib, immunotherapy with Nivolumab, or chemotherapy.  In general, [place] is not very sensitive to chemotherapy. -I reviewed her recent restaging CT from 11/10/2016 which showed disease progression in liver  -due to relatively poor tolerance of Sorafenib,  I would offer her Nivolumab as first-line therapy, potential side effects, which includes but not limited to, fatigue, skin rash, hypothyroidism, other endocrine disorders, pneumonitis, etc., were again discussed with patient in details. She agrees to proceed -the goal of therapy is palliative, not curable  - Start next week.  Treatment every 2 weeks.  -If she responds  well to treatment, I will see her every other treatment, once a month.  -Repeat scans after 2-3 months of tretment.  -She is tolerating Nivolumab well, we'll continue every 2 weeks treatment. FDA has recently approved Nivolumab 480 mg every 4 weeks, if she has good response, I'll change her to every 4 weeks in a few months.   2. Multiple substance abuse -Smoking, alcohol, cocaine, and marijuana -I strongly encouraged her to stop the above, however she is not very interested. She states she self-medicates with those substances due to her pain and low appetite. -She is still smoking marijuana. She has cut back on smoking cigarettes and drinking alcohol.  -I again encouraged her to stop using the above substances, especially when she is on treatment.  3. Abdominal pain -Probably related to her liver cancer. -Due to her multiple substance abuse, I strongly encouraged her to see a pain specialist. I would be happy to refer her. -I will not prescribe narcotic pain medication or BZ  -she takes tramadol as needed  - I have advised patient to take calcium, once a day, to help with her cramps  - Also advised her to continue drinking enough water - Could be because of constipation - Patient requests pain medication for her pain. We discussed and agreed on Tramadol. I advised the patient on how to take it. Counseled her to avoid overdosing. -I will not prescribe narcotics for now due to her polysubstance abuse  4. HTN -f/u with PCP -Low potassium due to this medication HCTZ.   5. Hepatitis C, treated  -I encouraged her to follow up with Dr. Linus Salmons   6. Hypokalemia  -Labs reviewed. Her potassium was low again. She has not taken her potassium pills due to finances, but she had been eating 2 bananas a day. She tried to get the over the counter potassium.  - Her potassium level is 3.6 today. I encouraged her to eat a banana to help   7. Portal vein tumor  thrombosis -chronic, secondary to [facility] -We discussed the benefit and risk of anticoagulation. Due to the chronic thrombosis, high risk of bleeding with anticoagulation due to portal hypertension, I think the risk of anticoagulation is overweight the benefit   8. Financial Stress -She can not afford drugs that have higher co-pays.  -She will talk to our financial office.  -She is applying to medicaid, but she still has transportation problems. -She previously talked to our Education officer, museum.   9.Goal of care discussion  -We previously discussed the incurable nature of her cancer without surgery, and the overall poor prognosis, especially if she does not have good response to chemotherapy or progress on chemo -The patient understands the goal of care is palliative. -I previously recommend DNR/DNI, she will think about it   10. Constipation - Patient reports to having constipation. She is taking a stool softner. I recommended taking Miralax or senna-s to help    Plan -  lab & Nivolumab infusion in 2& 4 weeks - f/u 4 weeks, will order restaging CT scan on her next visit  -I prescribed tramadol 50 mg every 8 hours as needed total 40 tablets   All questions were answered. The patient knows to call the clinic with any problems, questions or concerns.  I spent 25 minutes counseling the patient face to face. The total time spent in the appointment was 30 minutes and more than 50% was on counseling.  I have reviewed the above documentation for accuracy and completeness  and I agree with the above.  This document serves as a record of services personally performed by Truitt Merle, MD. It was created on her behalf by Brandt Loosen, a trained medical scribe. The creation of this record is based on the scribe's personal observations and the provider's statements to them. This document has been checked and approved by the attending provider.    Truitt Merle, MD 12/08/2016

## 2016-12-07 ENCOUNTER — Encounter: Payer: Self-pay | Admitting: Student

## 2016-12-07 ENCOUNTER — Other Ambulatory Visit: Payer: Self-pay | Admitting: Student

## 2016-12-07 ENCOUNTER — Ambulatory Visit (INDEPENDENT_AMBULATORY_CARE_PROVIDER_SITE_OTHER): Payer: Medicaid Other | Admitting: Student

## 2016-12-07 DIAGNOSIS — J449 Chronic obstructive pulmonary disease, unspecified: Secondary | ICD-10-CM | POA: Insufficient documentation

## 2016-12-07 DIAGNOSIS — K089 Disorder of teeth and supporting structures, unspecified: Secondary | ICD-10-CM | POA: Insufficient documentation

## 2016-12-07 DIAGNOSIS — Z720 Tobacco use: Secondary | ICD-10-CM | POA: Diagnosis not present

## 2016-12-07 DIAGNOSIS — I10 Essential (primary) hypertension: Secondary | ICD-10-CM

## 2016-12-07 MED ORDER — HYDROXYZINE HCL 10 MG PO TABS: 10.0000 mg | ORAL_TABLET | Freq: Three times a day (TID) | ORAL | 2 refills | Status: AC | PRN

## 2016-12-07 MED ORDER — HYDROXYZINE HCL 10 MG PO TABS: 10.0000 mg | ORAL_TABLET | Freq: Three times a day (TID) | ORAL | 2 refills | Status: DC | PRN

## 2016-12-07 MED ORDER — MOMETASONE FURO-FORMOTEROL FUM 200-5 MCG/ACT IN AERO: 1.0000 | INHALATION_SPRAY | Freq: Two times a day (BID) | RESPIRATORY_TRACT | 0 refills | Status: DC

## 2016-12-07 MED ORDER — MOMETASONE FURO-FORMOTEROL FUM 200-5 MCG/ACT IN AERO: 1.0000 | INHALATION_SPRAY | Freq: Two times a day (BID) | RESPIRATORY_TRACT | 0 refills | Status: AC

## 2016-12-07 NOTE — Assessment & Plan Note (Signed)
Long history of smoking concerning for COPD, however pateuin

## 2016-12-07 NOTE — Telephone Encounter (Signed)
Pt states she had medications called in after her appointment  and that they were sent to the wrong pharmacy. Pt would like them to go to [facility]. ep

## 2016-12-07 NOTE — Assessment & Plan Note (Signed)
Stable BP with cessationof HCTZ and norvasc, Will continue just lisinopril and follow -

## 2016-12-07 NOTE — Assessment & Plan Note (Signed)
Long history of smoking concerning for risk of COPD. She was started on [facility] and requests another sample - will give sample - will need PFTs

## 2016-12-07 NOTE — Telephone Encounter (Signed)
Medications sent to [facility] per patient request.  Derl Barrow, RN

## 2016-12-07 NOTE — Progress Notes (Signed)
   Subjective:    Patient ID: Lisa [name], female    DOB: [birth year], 61 y.o.   MRN: [id number]   CC: BP follow up  HPI: 61 y/o F with chronic hep C now with HCC here for BP follow up  BP - was noted to be hypotensive at last visit so her antihypertensive regimen was weaned to just lisinopril - since she stopped her other BP meds she denies dizziness or lightheadedness - denies chest pain, SOB at this time  COPD - states she does have occasional SOB from her COPD - requests Dulera sample  Poor dentition - requests referral to dentist as her front teeth have fallen out and she has difficulty eating  Smoking status reviewed  Review of Systems  Per HPI, else denies recent illness, fever   Objective:  BP 130/60   Pulse 82   Temp 98.7 F (37.1 C) (Oral)   Wt 119 lb (54 kg)   SpO2 97%   BMI 21.08 kg/m  Vitals and nursing note reviewed  General: NAD HEENT: diffuse decay, with evidence of cavities, missine upper and lower frontal teeth no evidence of abscess Cardiac: RRR,  Respiratory: CTAB, normal effort Skin: warm and dry, no rashes noted Neuro: alert and oriented, no focal deficits   Assessment & Plan:    HTN Stable BP with cessationof HCTZ and norvasc, Will continue just lisinopril and follow  Poor Dentition List of dentists that care for medicaid patients given  Tobacco abuse/concern for COPD Long history of smoking concerning for risk of COPD. She was started on [facility] and requests another sample - will give sample - will need PFTs   Sherrilynn Gudgel A. Lincoln Brigham MD, [place] [facility] Resident PGY-3 Pager [phone]

## 2016-12-07 NOTE — Patient Instructions (Signed)
Follow up as needed Only take lisinopril If you have questions or concerns,c all the office at [phone]

## 2016-12-07 NOTE — Assessment & Plan Note (Signed)
List of dentists that care for medicaid patients given

## 2016-12-08 ENCOUNTER — Ambulatory Visit (HOSPITAL_BASED_OUTPATIENT_CLINIC_OR_DEPARTMENT_OTHER): Payer: No Typology Code available for payment source

## 2016-12-08 ENCOUNTER — Ambulatory Visit (HOSPITAL_BASED_OUTPATIENT_CLINIC_OR_DEPARTMENT_OTHER): Payer: No Typology Code available for payment source | Admitting: Hematology

## 2016-12-08 ENCOUNTER — Ambulatory Visit: Payer: No Typology Code available for payment source | Admitting: Nutrition

## 2016-12-08 ENCOUNTER — Encounter: Payer: Self-pay | Admitting: Hematology

## 2016-12-08 ENCOUNTER — Telehealth: Payer: Self-pay | Admitting: Hematology

## 2016-12-08 ENCOUNTER — Other Ambulatory Visit (HOSPITAL_BASED_OUTPATIENT_CLINIC_OR_DEPARTMENT_OTHER): Payer: No Typology Code available for payment source

## 2016-12-08 VITALS — BP 157/63 | HR 83 | Temp 98.0°F | Resp 18 | Ht 63.0 in | Wt 123.4 lb

## 2016-12-08 DIAGNOSIS — C220 Liver cell carcinoma: Secondary | ICD-10-CM

## 2016-12-08 DIAGNOSIS — K7460 Unspecified cirrhosis of liver: Secondary | ICD-10-CM

## 2016-12-08 DIAGNOSIS — B182 Chronic viral hepatitis C: Secondary | ICD-10-CM

## 2016-12-08 DIAGNOSIS — F1910 Other psychoactive substance abuse, uncomplicated: Secondary | ICD-10-CM

## 2016-12-08 DIAGNOSIS — Z5112 Encounter for antineoplastic immunotherapy: Secondary | ICD-10-CM

## 2016-12-08 LAB — CBC WITH DIFFERENTIAL/PLATELET
BASO%: 1 % (ref 0.0–2.0)
Basophils Absolute: 0.1 10*3/uL (ref 0.0–0.1)
EOS%: 0.8 % (ref 0.0–7.0)
Eosinophils Absolute: 0 10*3/uL (ref 0.0–0.5)
HEMATOCRIT: 40.7 % (ref 34.8–46.6)
HEMOGLOBIN: 14 g/dL (ref 11.6–15.9)
LYMPH#: 1.4 10*3/uL (ref 0.9–3.3)
LYMPH%: 27.1 % (ref 14.0–49.7)
MCH: 31.9 pg (ref 25.1–34.0)
MCHC: 34.4 g/dL (ref 31.5–36.0)
MCV: 92.7 fL (ref 79.5–101.0)
MONO#: 0.9 10*3/uL (ref 0.1–0.9)
MONO%: 17.8 % — ABNORMAL HIGH (ref 0.0–14.0)
NEUT%: 53.3 % (ref 38.4–76.8)
NEUTROS ABS: 2.7 10*3/uL (ref 1.5–6.5)
NRBC: 0 % (ref 0–0)
Platelets: 191 10*3/uL (ref 145–400)
RBC: 4.39 10*6/uL (ref 3.70–5.45)
RDW: 16 % — AB (ref 11.2–14.5)
WBC: 5.1 10*3/uL (ref 3.9–10.3)

## 2016-12-08 LAB — COMPREHENSIVE METABOLIC PANEL
ALBUMIN: 2.8 g/dL — AB (ref 3.5–5.0)
ALT: 22 U/L (ref 0–55)
AST: 56 U/L — ABNORMAL HIGH (ref 5–34)
Alkaline Phosphatase: 95 U/L (ref 40–150)
Anion Gap: 6 mEq/L (ref 3–11)
BILIRUBIN TOTAL: 1.29 mg/dL — AB (ref 0.20–1.20)
BUN: 6.6 mg/dL — AB (ref 7.0–26.0)
CO2: 24 mEq/L (ref 22–29)
Calcium: 8.6 mg/dL (ref 8.4–10.4)
Chloride: 105 mEq/L (ref 98–109)
Creatinine: 0.7 mg/dL (ref 0.6–1.1)
Glucose: 109 mg/dl (ref 70–140)
POTASSIUM: 3.6 meq/L (ref 3.5–5.1)
Sodium: 136 mEq/L (ref 136–145)
Total Protein: 7.9 g/dL (ref 6.4–8.3)

## 2016-12-08 LAB — TECHNOLOGIST REVIEW

## 2016-12-08 MED ORDER — SODIUM CHLORIDE 0.9 % IV SOLN
240.0000 mg | Freq: Once | INTRAVENOUS | Status: AC
  Administered 2016-12-08: 240 mg via INTRAVENOUS
  Filled 2016-12-08: qty 20

## 2016-12-08 MED ORDER — SODIUM CHLORIDE 0.9 % IV SOLN
Freq: Once | INTRAVENOUS | Status: AC
  Administered 2016-12-08: 14:00:00 via INTRAVENOUS

## 2016-12-08 MED ORDER — TRAMADOL HCL 50 MG PO TABS: 50.0000 mg | ORAL_TABLET | Freq: Four times a day (QID) | ORAL | 0 refills | Status: AC | PRN

## 2016-12-08 MED FILL — traMADol HCL 50 MG TABS: 50 | 10 days supply | Qty: 40 | Fill #0

## 2016-12-08 NOTE — Patient Instructions (Signed)
[facility] Discharge Instructions for Patients Receiving Chemotherapy  Today you received the following chemotherapy agents:  Nivolumab.  To help prevent nausea and vomiting after your treatment, we encourage you to take your nausea medication as directed.   If you develop nausea and vomiting that is not controlled by your nausea medication, call the clinic.   BELOW ARE SYMPTOMS THAT SHOULD BE REPORTED IMMEDIATELY:  *FEVER GREATER THAN 100.5 F  *CHILLS WITH OR WITHOUT FEVER  NAUSEA AND VOMITING THAT IS NOT CONTROLLED WITH YOUR NAUSEA MEDICATION  *UNUSUAL SHORTNESS OF BREATH  *UNUSUAL BRUISING OR BLEEDING  TENDERNESS IN MOUTH AND THROAT WITH OR WITHOUT PRESENCE OF ULCERS  *URINARY PROBLEMS  *BOWEL PROBLEMS  UNUSUAL RASH Items with * indicate a potential emergency and should be followed up as soon as possible.  Feel free to call the clinic you have any questions or concerns. The clinic phone number is [phone].  Please show the CHEMO ALERT CARD at check-in to the Emergency Department and triage nurse.   

## 2016-12-08 NOTE — Telephone Encounter (Signed)
Gave patients daughter AVS and calender per 12/08/2016 los. Patient in treatment area. Scheduled appts per 12/08/2016 los

## 2016-12-08 NOTE — Progress Notes (Signed)
61 year old female diagnosed with hepatocellular cancer. She is a patient of Dr. Burr Medico.  Past medical history includes hepatitis C, liver cirrhosis, osteopenia, alcohol, hypertension, tobacco, and polysubstance abuse.  Medications include Colace, ferrous sulfate, Compazine, and vitamin B1 and vitamin B12.  Labs include BUN 4.7, and albumin 3.2.  Height: 63 inches. Weight: 123.4 pounds. Usual body weight: 120 pounds. BMI: 21.86.  Patient is receiving palliative immunotherapy. She reports she has nausea with oral intake. States she continues to suffer from constipation. She dislikes eating breakfast.  Nutrition diagnosis:  Food and nutrition related knowledge deficit related to hepatocellular cancer as evidenced by no prior need for nutrition related information.  Intervention: Patient was educated to consume smaller more frequent meals and snacks choosing high-calorie, high-protein foods to promote weight maintenance. Suggested patient try oral nutrition supplements first thing in the morning.  I provided samples. Educated patient on strategies for improving constipation. Provided fact sheets on calories and protein, and constipation. Questions were answered.  Teach back method used.  Contact information given.  Monitoring, evaluation, goals: Patient will tolerate adequate calories and protein to minimize loss of lean body mass.  Next visit: To be scheduled with treatments as needed.  **Disclaimer: This note was dictated with voice recognition software. Similar sounding words can inadvertently be transcribed and this note may contain transcription errors which may not have been corrected upon publication of note.**

## 2016-12-14 ENCOUNTER — Telehealth: Payer: Self-pay | Admitting: *Deleted

## 2016-12-14 ENCOUNTER — Ambulatory Visit: Payer: Medicaid Other

## 2016-12-14 NOTE — Telephone Encounter (Signed)
Pt called requesting a call back from nurse.  Spoke with pt and was informed that last night, pt experienced severe swelling of vaginal lips.  Denied pain, denied burning, able to void fine.  Stated also experienced of breast swelling.   Stated vaginal lips swelling has gone down some but still larger than normal.   Dr. Burr Medico notified.  Spoke with pt and instructed pt to call her PCP and/or her GYN provider for reporting of above symptoms.  Pt voiced understanding. Pt's   Phone     [phone].

## 2016-12-15 ENCOUNTER — Encounter: Payer: Self-pay | Admitting: Student

## 2016-12-15 ENCOUNTER — Ambulatory Visit (INDEPENDENT_AMBULATORY_CARE_PROVIDER_SITE_OTHER): Payer: Medicaid Other | Admitting: Student

## 2016-12-15 DIAGNOSIS — N9489 Other specified conditions associated with female genital organs and menstrual cycle: Secondary | ICD-10-CM | POA: Diagnosis not present

## 2016-12-15 DIAGNOSIS — R238 Other skin changes: Secondary | ICD-10-CM

## 2016-12-15 DIAGNOSIS — J984 Other disorders of lung: Secondary | ICD-10-CM | POA: Insufficient documentation

## 2016-12-15 DIAGNOSIS — R188 Other ascites: Secondary | ICD-10-CM | POA: Insufficient documentation

## 2016-12-15 NOTE — Assessment & Plan Note (Signed)
Now resolved. Will follow if this again becomes an issue

## 2016-12-15 NOTE — Progress Notes (Signed)
   Subjective:    Patient ID: Lisa [name], female    DOB: Aug 27, 1956, 61 y.o.   MRN: [id number]   CC: sore breasts, swollen labia  HPI: 61 y/o F presents for sore breasts bilaterally and swollen labia  Sore breasts - noted over the last several days - she feels they are both tender with itching - she denies irritation else where - denies nipple discharge  Swollen labia - noted that both labia were swollen as well as her mons yesterday - they were not painful or red - she has not had intercourse in several months - she denies putting any thing different on or in her vagina - she feels the swelling has resolved at this time  Restrictive lung disease - has been using dulera with some improvement in her symptoms when she uses it but does have SOB with exertion of she does not use it  HCC - currently undergoing chemotherapy with Nivolumab   Smoking status reviewed 0.5 ppd smoker Review of Systems  Per HPI, else denies recent illness, fever, chest pain,     Objective:  BP 122/60   Pulse 88   Temp 98.8 F (37.1 C) (Oral)   Wt 128 lb (58.1 kg)   SpO2 99%   BMI 22.67 kg/m  Vitals and nursing note reviewed  General: NAD Cardiac: RRR,  Respiratory: CTAB, normal effort Breasts: skin thickening with mild erythema of bilateral breasts, non tender to palpation, no warm, no nipple discharge, no masses palpated Abdomen: distended, soft, nontender, + fluid wave Extremities: no edema or cyanosis. WWP. Skin: as above Pelvic: Normal EGBUS, normal vaginal canal   Assessment & Plan:    Skin irritation Skin irritation of bilateral breasts with exam inconsistent with cellulitis. Skin changes not consistent with contact dermatitis. I am concerned that she is having a skin reaction to her chemotherapeutic agent Nivolumab.  - she will follow wither her oncologist for this - will follow along as needed   Restrictive lung disease Continue Dulera. She was advised on better  compliance as she has not been using it consistently. Will Reassess at next visit  Ascites Mild ascites noted on exam today. She is currently undergoing chemotherapy for HCC.  - will follow with oncology/GI, I imagine she may need paracentesis as her disease progresses  Labial swelling Now resolved. Will follow if this again becomes an issue    Rozell Theiler A. Lincoln Brigham MD, [facility] Resident PGY-3 Pager [phone]

## 2016-12-15 NOTE — Assessment & Plan Note (Signed)
Mild ascites noted on exam today. She is currently undergoing chemotherapy for HCC.  - will follow with oncology/GI, I imagine she may need paracentesis as her disease progresses

## 2016-12-15 NOTE — Assessment & Plan Note (Signed)
Skin irritation of bilateral breasts with exam inconsistent with cellulitis. Skin changes not consistent with contact dermatitis. I am concerned that she is having a skin reaction to her chemotherapeutic agent Nivolumab.  - she will follow wither her oncologist for this - will follow along as needed

## 2016-12-15 NOTE — Assessment & Plan Note (Signed)
Continue Dulera. She was advised on better compliance as she has not been using it consistently. Will Reassess at next visit

## 2016-12-15 NOTE — Patient Instructions (Addendum)
Follow with your oncologist for skin changes over breasts as well as ascites Take Dulera twice per day as prescribed Call the office at [phone] with questions or concernshh

## 2016-12-15 NOTE — Progress Notes (Signed)
[facility] Counseling Note  Per Abby Potash Hock's/LCSW referral, LVM on mobile number explaining [facility]'s resources, including individual counseling.  [place] Pam, [place] [facility] [place]

## 2016-12-22 ENCOUNTER — Telehealth: Payer: Self-pay | Admitting: Hematology

## 2016-12-22 ENCOUNTER — Other Ambulatory Visit (HOSPITAL_BASED_OUTPATIENT_CLINIC_OR_DEPARTMENT_OTHER): Payer: Medicaid Other

## 2016-12-22 ENCOUNTER — Ambulatory Visit (HOSPITAL_BASED_OUTPATIENT_CLINIC_OR_DEPARTMENT_OTHER): Payer: Medicaid Other | Admitting: Hematology

## 2016-12-22 ENCOUNTER — Ambulatory Visit (HOSPITAL_BASED_OUTPATIENT_CLINIC_OR_DEPARTMENT_OTHER): Payer: Medicaid Other

## 2016-12-22 ENCOUNTER — Encounter: Payer: Self-pay | Admitting: Hematology

## 2016-12-22 VITALS — BP 140/58 | HR 92 | Temp 98.0°F | Resp 18 | Ht 63.0 in | Wt 133.8 lb

## 2016-12-22 DIAGNOSIS — R109 Unspecified abdominal pain: Secondary | ICD-10-CM | POA: Diagnosis not present

## 2016-12-22 DIAGNOSIS — Z5112 Encounter for antineoplastic immunotherapy: Secondary | ICD-10-CM | POA: Diagnosis present

## 2016-12-22 DIAGNOSIS — R188 Other ascites: Secondary | ICD-10-CM | POA: Diagnosis not present

## 2016-12-22 DIAGNOSIS — C220 Liver cell carcinoma: Secondary | ICD-10-CM

## 2016-12-22 DIAGNOSIS — I81 Portal vein thrombosis: Secondary | ICD-10-CM | POA: Diagnosis not present

## 2016-12-22 DIAGNOSIS — E876 Hypokalemia: Secondary | ICD-10-CM | POA: Diagnosis not present

## 2016-12-22 DIAGNOSIS — F1910 Other psychoactive substance abuse, uncomplicated: Secondary | ICD-10-CM | POA: Diagnosis not present

## 2016-12-22 DIAGNOSIS — Z720 Tobacco use: Secondary | ICD-10-CM

## 2016-12-22 DIAGNOSIS — K5900 Constipation, unspecified: Secondary | ICD-10-CM

## 2016-12-22 DIAGNOSIS — R609 Edema, unspecified: Secondary | ICD-10-CM

## 2016-12-22 LAB — COMPREHENSIVE METABOLIC PANEL
ALBUMIN: 2.3 g/dL — AB (ref 3.5–5.0)
ALT: 26 U/L (ref 0–55)
ANION GAP: 4 meq/L (ref 3–11)
AST: 79 U/L — ABNORMAL HIGH (ref 5–34)
Alkaline Phosphatase: 108 U/L (ref 40–150)
BILIRUBIN TOTAL: 1.55 mg/dL — AB (ref 0.20–1.20)
BUN: 13.2 mg/dL (ref 7.0–26.0)
CALCIUM: 8.3 mg/dL — AB (ref 8.4–10.4)
CO2: 22 mEq/L (ref 22–29)
CREATININE: 0.8 mg/dL (ref 0.6–1.1)
Chloride: 102 mEq/L (ref 98–109)
EGFR: >90 mL/min/{1.73_m2} (ref >90)
Glucose: 105 mg/dl (ref 70–140)
Potassium: 4.6 mEq/L (ref 3.5–5.1)
Sodium: 128 mEq/L — ABNORMAL LOW (ref 136–145)
TOTAL PROTEIN: 7.4 g/dL (ref 6.4–8.3)

## 2016-12-22 LAB — CBC WITH DIFFERENTIAL/PLATELET
BASO%: 0.5 % (ref 0.0–2.0)
Basophils Absolute: 0 10*3/uL (ref 0.0–0.1)
EOS ABS: 0 10*3/uL (ref 0.0–0.5)
EOS%: 0.4 % (ref 0.0–7.0)
HEMATOCRIT: 42.8 % (ref 34.8–46.6)
HEMOGLOBIN: 15.2 g/dL (ref 11.6–15.9)
LYMPH%: 30.1 % (ref 14.0–49.7)
MCH: 31.4 pg (ref 25.1–34.0)
MCHC: 35.5 g/dL (ref 31.5–36.0)
MCV: 88.4 fL (ref 79.5–101.0)
MONO#: 2.3 10*3/uL — AB (ref 0.1–0.9)
MONO%: 29.5 % — ABNORMAL HIGH (ref 0.0–14.0)
NEUT%: 39.5 % (ref 38.4–76.8)
NEUTROS ABS: 3 10*3/uL (ref 1.5–6.5)
NRBC: 0 % (ref 0–0)
PLATELETS: 108 10*3/uL — AB (ref 145–400)
RBC: 4.84 10*6/uL (ref 3.70–5.45)
RDW: 16 % — ABNORMAL HIGH (ref 11.2–14.5)
WBC: 7.6 10*3/uL (ref 3.9–10.3)
lymph#: 2.3 10*3/uL (ref 0.9–3.3)

## 2016-12-22 MED ORDER — SODIUM CHLORIDE 0.9 % IV SOLN
240.0000 mg | Freq: Once | INTRAVENOUS | Status: AC
  Administered 2016-12-22: 240 mg via INTRAVENOUS
  Filled 2016-12-22: qty 20

## 2016-12-22 MED ORDER — SODIUM CHLORIDE 0.9 % IV SOLN
Freq: Once | INTRAVENOUS | Status: AC
  Administered 2016-12-22: 14:00:00 via INTRAVENOUS

## 2016-12-22 MED ORDER — FUROSEMIDE 20 MG PO TABS: 20.0000 mg | ORAL_TABLET | Freq: Every day | ORAL | 0 refills | Status: AC

## 2016-12-22 MED FILL — FUROSEMIDE 20 MG TABLET: 20 | 30 days supply | Qty: 30 | Fill #0

## 2016-12-22 NOTE — Progress Notes (Signed)
[facility]  [phone] [phone]  Clinic Follow Up Note   Patient Care Team: Veatrice Bourbon, MD as PCP - General (Family Medicine) 12/22/2016   CHIEF COMPLAINTS:  Follow up for hepatocellular carcinoma  Oncology History   Hepatocellular carcinoma ([place])   Staging form: Liver (Excluding Intrahepatic Bile Ducts), AJCC 7th Edition   - Clinical stage from 08/31/2016: Stage II (T2, N0, M0) - Signed by Truitt Merle, MD on 09/09/2016      Hepatocellular carcinoma ([place])   07/29/2016 Tumor Marker    AFP 11.1      08/31/2016 Initial Diagnosis    Hepatocellular carcinoma ([place])      08/31/2016 Imaging    Liver MRI w wo contrast showed progressive multifocal hypervascular tumor within the anterior right hepatic lobe, consistent with [place]. 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 Stable nonspecific prominent lymph nodes within the gastrohepatic ligament and upper retroperitoneum. Mildly increased ascites.       10/11/2016 Imaging    US Renal Ultrasound appearance of the kidneys is within normal limits. Partially visualized liver masses.   Small amount of ascites      10/11/2016 - 10/11/2016 Hospital Admission    Admit date: 10/11/16 Patient reported to ED for right-sided flank pain ongoing for 2 days.      11/10/2016 Imaging    CT Abdomen Pelvis w/ Contrast 1. The previously noted lesion in segment 5 of the liver is similar to slightly larger than the prior examination, while the other lesion is not as well seen. However, the other more central lesion which is located predominantly in segment 8 demonstrates increasing tumor thrombus extension into the portal venous system involving essentially the entire right-sided portal venous system, with some early extension into the left portal vein system on today's examination. These findings indicate progression of disease. In addition, there is worsening retroperitoneal and upper abdominal  lymphadenopathy.  2. Trace volume of perihepatic ascites.  3. Small pericardial effusion.  4. Cardiomegaly.  5. Aortic atherosclerosis.      11/24/2016 -  Chemotherapy    Nivolumab 240mg  every 2 weeks          HISTORY OF PRESENTING ILLNESS:  Lisa [name] 61 y.o. female is here because of her recently diagnosed hepatocellular carcinoma. She is accompanied by her daughter and granddaughter to my clinic today.  She was found to have Hep C in 2006 when she was incarcerated, and she finally received Hep C treatment and was successful last year under the care of Dr. Linus Salmons.  She was found to have liver cirrhosis last year, no history of mucositis, GI bleeding, or other complications. She does not have a gastroenterologist.  During her routine follow-up, her abdominal ultrasound in May 2017 showed cirrhotic change, hypoechoic masslike region in the right lobe measuring 2.3 cm. She underwent liver MRI in May 2017, which showed a 1.7 cm lesion in the right hepatic lobe, with faint rim of arterial phase enhancement, with intermediate probability for [facility]. 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  She has had RUQ pain for the past year, worse lately, she also complains of fatigue, sleeps a lot, able to function at home. She has good appetite, she has gained 5 lbs lately she lives with her daughter now.   Current therapy: Nivolumab, every 2 weeks   INTERIM HISTORY: She returns for follow up  and treatment. She reports swelling in multiple areas of body including ankles, belly vagina lips and  Breast. She also reports to coughing up clear phlegm. She Also complains of fatigue, she is able to take care of herself, but has not been able to do much else daily. She denies any fever, chills, or other new pains   MEDICAL HISTORY:  Past Medical History:  Diagnosis Date  .  Alcohol abuse, daily use   . Cirrhosis of liver ([place])   . Helicobacter pylori gastritis   . Hepatitis C   . Hepatocellular carcinoma ([place]) dx'd 08/2016  . HTN (hypertension)   . Hx of cardiovascular stress test    Lexiscan Myoview 3/16: No ischemia, EF 66%, normal study  . Leg pain    ABIs 3/16:  normal   . Mitral valve prolapse   . Osteopetrosis   . Poor dental hygiene   . Tobacco use     SURGICAL HISTORY: Past Surgical History:  Procedure Laterality Date  . ABDOMINAL HYSTERECTOMY    . CARPAL TUNNEL RELEASE    . IR GENERIC HISTORICAL  09/28/2016   IR RADIOLOGIST EVAL & MGMT 09/28/2016 Sandi Mariscal, MD [facility] INTERV RAD  . LAPAROSCOPY    . rod in right arm      SOCIAL HISTORY: Social History   Social History  . Marital status: Divorced    Spouse name: N/A  . Number of children: 1  . Years of education: N/A   Occupational History  . personal care asst [employer] History Main Topics  . Smoking status: Current Every Day Smoker    Packs/day: 0.50    Years: 46.00    Types: Cigarettes    Start date: 09/26/1969  . Smokeless tobacco: Never Used     Comment: has cut back about 1/2 pk per day  . Alcohol use 25.2 oz/week    42 Standard drinks or equivalent per week     Comment: 40 oz beer a day   . Drug use: Yes    Types: Marijuana, Cocaine     Comment: Still using marijuana.  Past history of IV drug use  . Sexual activity: Not Currently   Other Topics Concern  . Not on file   Social History Narrative   Lives alone in [place] in an apartment. 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 No current source of income. Goes to [employer] for help as needed.   One daughter, age 52. Six grandchildren, one great-grand child.     Rides bus for transportation    FAMILY HISTORY: Family History  Problem Relation Age of Onset  . Cancer Father     Prostate  . Heart disease Mother   . Stroke Sister   . Heart disease  Sister   . Lupus Sister   . Heart attack Neg Hx   . Colon cancer Neg Hx   . Colon polyps Neg Hx     ALLERGIES:  is allergic to celecoxib and sulfonamide derivatives.  MEDICATIONS:  Current Outpatient Prescriptions  Medication Sig Dispense Refill  . aspirin 81 MG tablet Take 81 mg by mouth daily.    Marland Kitchen docusate sodium (COLACE) 100 MG capsule Take 1 capsule (100 mg total) by mouth 2 (two) times daily. (Patient taking differently: Take 100 mg by mouth daily as needed. ) 60 capsule 0  . ferrous sulfate 325 (65 FE) MG tablet Take 1 tablet (325 mg total)  by mouth daily with breakfast. 90 tablet 3  . folic acid (FOLVITE) 1 MG tablet Take 1 tablet (1 mg total) by mouth daily. 60 tablet 0  . hydrOXYzine (ATARAX/VISTARIL) 10 MG tablet Take 1 tablet (10 mg total) by mouth 3 (three) times daily as needed for anxiety (or sleep). 90 tablet 2  . lisinopril (PRINIVIL,ZESTRIL) 20 MG tablet Take 1 tablet (20 mg total) by mouth daily. 90 tablet 2  . methocarbamol (ROBAXIN) 500 MG tablet Take 1 tablet (500 mg total) by mouth 2 (two) times daily. 20 tablet 0  . mometasone-formoterol (DULERA) 200-5 MCG/ACT AERO Inhale 1 puff into the lungs 2 (two) times daily. 1 Inhaler 0  . naphazoline-pheniramine (NAPHCON-A) 0.025-0.3 % ophthalmic solution Place 1 drop into both eyes every 4 (four) hours as needed for irritation. 15 mL 1  . potassium chloride SA (K-DUR,KLOR-CON) 20 MEQ tablet Take 1 tablet (20 mEq total) by mouth 2 (two) times daily. 30 tablet 1  . prochlorperazine (COMPAZINE) 10 MG tablet Take 1 tablet (10 mg total) by mouth every 6 (six) hours as needed for nausea or vomiting. 30 tablet 0  . Thiamine HCl (VITAMIN B-1) 250 MG tablet Take 1 tablet (250 mg total) by mouth daily. 30 tablet 4  . traMADol (ULTRAM) 50 MG tablet Take 1 tablet (50 mg total) by mouth every 6 (six) hours as needed. 40 tablet 0  . vitamin B-12 (CYANOCOBALAMIN) 250 MCG tablet Take 1 tablet (250 mcg total) by mouth daily. (Patient taking  differently: Take 500 mcg by mouth daily. ) 30 tablet 4  . amLODipine (NORVASC) 10 MG tablet Take 1 tablet (10 mg total) by mouth daily. (Patient not taking: Reported on 12/22/2016) 90 tablet 2  . furosemide (LASIX) 20 MG tablet Take 1 tablet (20 mg total) by mouth daily. 30 tablet 0  . hydrochlorothiazide (HYDRODIURIL) 25 MG tablet Take 1 tablet (25 mg total) by mouth daily. 90 tablet 2   No current facility-administered medications for this visit.     REVIEW OF SYSTEMS:   Constitutional: Denies fevers, chills or abnormal night sweats  (+) fatigue, pain in abdominal area Eyes: Denies blurriness of vision, double vision or watery eyes Ears, nose, mouth, throat, and face: Denies mucositis or sore throat (+) coughing up phlegm  Respiratory: Denies dyspnea or wheezes  Cardiovascular: Denies palpitation, chest discomfort or lower extremity swelling Gastrointestinal:  Denies heartburn Skin: Denies abnormal skin rashes Lymphatics: Denies new lymphadenopathy or easy bruising Neurological:Denies numbness, tingling or new weaknesses Behavioral/Psych: Mood is stable, no new changes Musculoskeletal: (+) knee and pain All other systems were reviewed with the patient and are negative. GYN: (+)swollen vagina lips   PHYSICAL EXAMINATION:  ECOG PERFORMANCE STATUS: 2  Vitals:   12/22/16 1234  BP: (!) 140/58  Pulse: 92  Resp: 18  Temp: 98 F (36.7 C)   Filed Weights   12/22/16 1234  Weight: 133 lb 12.8 oz (60.7 kg)    GENERAL:alert, no distress and comfortable SKIN: skin color, texture, turgor are normal, no rashes or significant lesions EYES: normal, conjunctiva are pink and non-injected, sclera clear OROPHARYNX:no exudate, no erythema and lips, buccal mucosa, and tongue normal  NECK: supple, thyroid normal size, non-tender, without nodularity LYMPH:  no palpable lymphadenopathy in the cervical, axillary or inguinal LUNGS: clear to auscultation and percussion with normal breathing  effort HEART: regular rate & rhythm and no murmurs and no lower extremity edema ABDOMEN:abdomen soft, mild tenderness at RUQ, No other palpable abdominal mass, normal bowel  sounds Musculoskeletal:no cyanosis of digits and no clubbing  PSYCH: alert & oriented x 3 with fluent speech NEURO: no focal motor/sensory deficits  LABORATORY DATA:  I have reviewed the data as listed CBC Latest Ref Rng & Units 12/22/2016 12/08/2016 11/24/2016  WBC 3.9 - 10.3 10e3/uL 7.6 5.1 5.0  Hemoglobin 11.6 - 15.9 g/dL 15.2 14.0 13.9  Hematocrit 34.8 - 46.6 % 42.8 40.7 40.1  Platelets 145 - 400 10e3/uL 108(L) 191 256   CMP Latest Ref Rng & Units 12/22/2016 12/08/2016 11/24/2016  Glucose 70 - 140 mg/dl 105 109 83  BUN 7.0 - 26.0 mg/dL 13.2 6.6(L) 4.7(L)  Creatinine 0.6 - 1.1 mg/dL 0.8 0.7 0.7  Sodium 136 - 145 mEq/L 128(L) 136 137  Potassium 3.5 - 5.1 mEq/L 4.6 3.6 3.7  Chloride 101 - 111 mmol/L - - -  CO2 22 - 29 mEq/L 22 24 25   Calcium 8.4 - 10.4 mg/dL 8.3(L) 8.6 9.1  Total Protein 6.4 - 8.3 g/dL 7.4 7.9 8.8(H)  Total Bilirubin 0.20 - 1.20 mg/dL 1.55(H) 1.29(H) 1.03  Alkaline Phos 40 - 150 U/L 108 95 114  AST 5 - 34 U/L 79(H) 56(H) 99(H)  ALT 0 - 55 U/L 26 22 30    AFP 07/29/2016: 11.1 09/01/2016: 15 11/03/2016: 15.7   PATHOLOGY REPORT  None   RADIOGRAPHIC STUDIES:  CT ABD/Pelvis W Contrast 11/10/16 IMPRESSION: 1. The previously noted lesion in segment 5 of the liver is similar to slightly larger than the prior examination, while the other lesion is not as well seen. However, the other more central lesion which is located predominantly in segment 8 demonstrates increasing tumor thrombus extension into the portal venous system involving essentially the entire right-sided portal venous system, with some early extension into the left portal vein system on today's examination. These findings indicate progression of disease. In addition, there is worsening retroperitoneal and upper  abdominal lymphadenopathy. 2. Trace volume of perihepatic ascites. 3. Small pericardial effusion. 4. Cardiomegaly. 5. Aortic atherosclerosis. 6. Additional incidental findings, as above.  I have personally reviewed the radiological images as listed and agreed with the findings in the report. No results found.  ASSESSMENT & PLAN:  61 y.o. African-American female, with past medical history of polysubstance abuse, hepatitis C, liver cirrhosis, presented with right abdominal pain, MRI showed multifocal right lobe liver masses.  1. Hepatocellular carcinoma, multifocal, T2N0Mx, stage II  -I previously reviewed her imaging findings, laboratory results including AFP, and her diagnosis and staging. -Her previous MRI findings is consistent with multifocal hepatocellular carcinoma, giving her underlying hepatitis C, liver cirrhosis, elevated AFP, I think her diagnosis is quite certain. I don't think he needs needle biopsied  to confirm the diagnosis. This is also the consensus form our GI tumor board  -We previously discussed the treatment option of liver transplant versus liver targeted therapy for his [place]. Giving the multifocal disease, and her size of the tumor, motor substance abuse, she is probably not a candidate for surgical resection or liver transplant. Patient is not a very interesting surgery also. -She would be a good candidate for liver targeted therapy. She was seen by IR and Y90 was offered, unfortunately her insurance does not cover Y90 and liver targeted therapy is not available. -We again discussed the systemic therapy options, which includes sorafenib, immunotherapy with Nivolumab, or chemotherapy.  In general, [place] is not very sensitive to chemotherapy. -I reviewed her recent restaging CT from 11/10/2016 which showed disease progression in liver  -due to relatively poor tolerance  of Sorafenib,  I would offer her Nivolumab as first-line therapy, potential side effects, which includes but  not limited to, fatigue, skin rash, hypothyroidism, other endocrine disorders, pneumonitis, etc., were again discussed with patient in details. She agrees to proceed -the goal of therapy is palliative, not curable  --She has started and has been tolerating Nivolumab well, we'll continue every 2 weeks treatment. FDA has recently approved Nivolumab 480 mg every 4 weeks, if she has good response, I'll change her to every 4 weeks if her insurance approves  - Her liver function is a [place] worse today.   -She also has developed ascites, fatigue, possibly related to her liver cirrhosis versus malignancy, - We will continue treatment toady, repeat staging scan in one month   2. Multiple substance abuse -Smoking, alcohol, cocaine, and marijuana -I strongly encouraged her to stop the above, however she is not very interested. She states she self-medicates with those substances due to her pain and low appetite. -She is still smoking marijuana. She has cut back on smoking cigarettes and drinking alcohol.  -I again encouraged her to stop using the above substances, especially when she is on treatment.  3. Abdominal pain, ascites and LE edema  -Probably related to her liver cancer. -Due to her multiple substance abuse, I strongly encouraged her to see a pain specialist. I would be happy to refer her. -I will not prescribe narcotic pain medication or BZ  -she takes tramadol as needed  - I have advised patient to take calcium, once a day, to help with her cramps  - Also advised her to continue drinking enough water - Could be because of constipation - Patient requests pain medication for her pain. We discussed and agreed on Tramadol. I advised the patient on how to take it. Counseled her to avoid overdosing. -I will not prescribe narcotics for now due to her polysubstance abuse -she has developed fluid overload, including ascites, lower extremity and vaginal edema. -I called in Lasix 20 one daily, along  with potassium 20 min equal daily -repeat BMP next week    4. HTN -f/u with PCP -Low potassium due to this medication HCTZ.  - I have advised the patient to stop taking HCTZ.  5. Hepatitis C, treated  -I encouraged her to follow up with Dr. Linus Salmons   6. Hypokalemia  -Labs reviewed. Her potassium was low again. She has not taken her potassium pills due to finances, but she had been eating 2 bananas a day. She tried to get the over the counter potassium.  - Her potassium level is 4.6 today.  7. Portal vein tumor thrombosis -chronic, secondary to [facility] -We discussed the benefit and risk of anticoagulation. Due to the chronic thrombosis, high risk of bleeding with anticoagulation due to portal hypertension, I think the risk of anticoagulation is overweight the benefit   8. Financial Stress -She can not afford drugs that have higher co-pays.  -She will talk to our financial office.  -She is applying to medicaid, but she still has transportation problems. -She previously talked to our Education officer, museum.   9.Goal of care discussion  -We previously discussed the incurable nature of her cancer without surgery, and the overall poor prognosis, especially if she does not have good response to chemotherapy or progress on chemo -The patient understands the goal of care is palliative. -I previously recommend DNR/DNI, she will think about it   10. Constipation - Patient reports to having constipation. She is taking a stool softner.  I recommended taking Miralax or senna-s to help    Plan  - Continue Nivolumab today and every 2 weeks  - f/u with lab in 1 wwek - I will prescribe Lasix 20mg  daily for her fluid overload -F/U in 2 weeks, repeat scan in 4 weeks   All questions were answered. The patient knows to call the clinic with any problems, questions or concerns.  I spent 25 minutes counseling the patient face to face. The total time spent in the appointment was 30 minutes and more than 50% was on  counseling.  I have reviewed the above documentation for accuracy and completeness and I agree with the above.  This document serves as a record of services personally performed by Truitt Merle, MD. It was created on her behalf by Brandt Loosen, a trained medical scribe. The creation of this record is based on the scribe's personal observations and the provider's statements to them. This document has been checked and approved by the attending provider.    Truitt Merle, MD 12/22/2016

## 2016-12-22 NOTE — Telephone Encounter (Signed)
Gave patient AVS and scheduled lab only appt for next week per 12/22/2016 los.

## 2016-12-22 NOTE — Telephone Encounter (Signed)
Per Dr. Burr Medico - scheduled add on appt from today 12/22/2016 los.

## 2016-12-22 NOTE — Patient Instructions (Signed)
[facility] Discharge Instructions for Patients Receiving Chemotherapy  Today you received the following chemotherapy agents:  Nivolumab.  To help prevent nausea and vomiting after your treatment, we encourage you to take your nausea medication as directed.   If you develop nausea and vomiting that is not controlled by your nausea medication, call the clinic.   BELOW ARE SYMPTOMS THAT SHOULD BE REPORTED IMMEDIATELY:  *FEVER GREATER THAN 100.5 F  *CHILLS WITH OR WITHOUT FEVER  NAUSEA AND VOMITING THAT IS NOT CONTROLLED WITH YOUR NAUSEA MEDICATION  *UNUSUAL SHORTNESS OF BREATH  *UNUSUAL BRUISING OR BLEEDING  TENDERNESS IN MOUTH AND THROAT WITH OR WITHOUT PRESENCE OF ULCERS  *URINARY PROBLEMS  *BOWEL PROBLEMS  UNUSUAL RASH Items with * indicate a potential emergency and should be followed up as soon as possible.  Feel free to call the clinic you have any questions or concerns. The clinic phone number is [phone].  Please show the CHEMO ALERT CARD at check-in to the Emergency Department and triage nurse.   

## 2016-12-26 ENCOUNTER — Encounter (HOSPITAL_COMMUNITY): Payer: Self-pay | Admitting: Emergency Medicine

## 2016-12-26 ENCOUNTER — Observation Stay (HOSPITAL_COMMUNITY): Payer: Medicaid Other

## 2016-12-26 ENCOUNTER — Observation Stay (HOSPITAL_BASED_OUTPATIENT_CLINIC_OR_DEPARTMENT_OTHER): Payer: Medicaid Other

## 2016-12-26 ENCOUNTER — Inpatient Hospital Stay (HOSPITAL_COMMUNITY)
Admission: EM | Admit: 2016-12-26 | Discharge: 2017-01-24 | DRG: 186 | Disposition: E | Payer: Medicaid Other | Attending: Family Medicine | Admitting: Family Medicine

## 2016-12-26 ENCOUNTER — Encounter (HOSPITAL_COMMUNITY): Admission: EM | Disposition: E | Payer: Self-pay | Source: Home / Self Care | Attending: Family Medicine

## 2016-12-26 ENCOUNTER — Emergency Department (HOSPITAL_COMMUNITY): Payer: Medicaid Other

## 2016-12-26 DIAGNOSIS — I313 Pericardial effusion (noninflammatory): Secondary | ICD-10-CM | POA: Diagnosis present

## 2016-12-26 DIAGNOSIS — I314 Cardiac tamponade: Secondary | ICD-10-CM | POA: Diagnosis not present

## 2016-12-26 DIAGNOSIS — I81 Portal vein thrombosis: Secondary | ICD-10-CM | POA: Diagnosis present

## 2016-12-26 DIAGNOSIS — R188 Other ascites: Secondary | ICD-10-CM | POA: Diagnosis present

## 2016-12-26 DIAGNOSIS — F1910 Other psychoactive substance abuse, uncomplicated: Secondary | ICD-10-CM

## 2016-12-26 DIAGNOSIS — F1010 Alcohol abuse, uncomplicated: Secondary | ICD-10-CM | POA: Diagnosis present

## 2016-12-26 DIAGNOSIS — I959 Hypotension, unspecified: Secondary | ICD-10-CM | POA: Diagnosis not present

## 2016-12-26 DIAGNOSIS — Z4659 Encounter for fitting and adjustment of other gastrointestinal appliance and device: Secondary | ICD-10-CM

## 2016-12-26 DIAGNOSIS — Z9889 Other specified postprocedural states: Secondary | ICD-10-CM | POA: Diagnosis not present

## 2016-12-26 DIAGNOSIS — I10 Essential (primary) hypertension: Secondary | ICD-10-CM | POA: Diagnosis present

## 2016-12-26 DIAGNOSIS — G8929 Other chronic pain: Secondary | ICD-10-CM | POA: Diagnosis present

## 2016-12-26 DIAGNOSIS — K7460 Unspecified cirrhosis of liver: Secondary | ICD-10-CM

## 2016-12-26 DIAGNOSIS — R4182 Altered mental status, unspecified: Secondary | ICD-10-CM

## 2016-12-26 DIAGNOSIS — R601 Generalized edema: Secondary | ICD-10-CM

## 2016-12-26 DIAGNOSIS — Z9221 Personal history of antineoplastic chemotherapy: Secondary | ICD-10-CM

## 2016-12-26 DIAGNOSIS — F1410 Cocaine abuse, uncomplicated: Secondary | ICD-10-CM | POA: Diagnosis present

## 2016-12-26 DIAGNOSIS — Z7951 Long term (current) use of inhaled steroids: Secondary | ICD-10-CM

## 2016-12-26 DIAGNOSIS — Z66 Do not resuscitate: Secondary | ICD-10-CM | POA: Diagnosis present

## 2016-12-26 DIAGNOSIS — I509 Heart failure, unspecified: Secondary | ICD-10-CM

## 2016-12-26 DIAGNOSIS — J90 Pleural effusion, not elsewhere classified: Principal | ICD-10-CM | POA: Diagnosis present

## 2016-12-26 DIAGNOSIS — E871 Hypo-osmolality and hyponatremia: Secondary | ICD-10-CM

## 2016-12-26 DIAGNOSIS — Z720 Tobacco use: Secondary | ICD-10-CM | POA: Diagnosis present

## 2016-12-26 DIAGNOSIS — C220 Liver cell carcinoma: Secondary | ICD-10-CM | POA: Diagnosis not present

## 2016-12-26 DIAGNOSIS — I471 Supraventricular tachycardia: Secondary | ICD-10-CM | POA: Diagnosis not present

## 2016-12-26 DIAGNOSIS — Z832 Family history of diseases of the blood and blood-forming organs and certain disorders involving the immune mechanism: Secondary | ICD-10-CM

## 2016-12-26 DIAGNOSIS — Z515 Encounter for palliative care: Secondary | ICD-10-CM | POA: Diagnosis present

## 2016-12-26 DIAGNOSIS — K7031 Alcoholic cirrhosis of liver with ascites: Secondary | ICD-10-CM | POA: Diagnosis present

## 2016-12-26 DIAGNOSIS — Z79899 Other long term (current) drug therapy: Secondary | ICD-10-CM

## 2016-12-26 DIAGNOSIS — M549 Dorsalgia, unspecified: Secondary | ICD-10-CM | POA: Diagnosis present

## 2016-12-26 DIAGNOSIS — J449 Chronic obstructive pulmonary disease, unspecified: Secondary | ICD-10-CM | POA: Diagnosis present

## 2016-12-26 DIAGNOSIS — R1114 Bilious vomiting: Secondary | ICD-10-CM | POA: Diagnosis not present

## 2016-12-26 DIAGNOSIS — F17210 Nicotine dependence, cigarettes, uncomplicated: Secondary | ICD-10-CM | POA: Diagnosis present

## 2016-12-26 DIAGNOSIS — Z90710 Acquired absence of both cervix and uterus: Secondary | ICD-10-CM

## 2016-12-26 DIAGNOSIS — K7290 Hepatic failure, unspecified without coma: Secondary | ICD-10-CM | POA: Diagnosis present

## 2016-12-26 DIAGNOSIS — E875 Hyperkalemia: Secondary | ICD-10-CM | POA: Diagnosis not present

## 2016-12-26 DIAGNOSIS — R32 Unspecified urinary incontinence: Secondary | ICD-10-CM | POA: Diagnosis not present

## 2016-12-26 DIAGNOSIS — D6959 Other secondary thrombocytopenia: Secondary | ICD-10-CM | POA: Diagnosis present

## 2016-12-26 DIAGNOSIS — R000 Tachycardia, unspecified: Secondary | ICD-10-CM | POA: Diagnosis not present

## 2016-12-26 DIAGNOSIS — E873 Alkalosis: Secondary | ICD-10-CM | POA: Diagnosis not present

## 2016-12-26 DIAGNOSIS — Z823 Family history of stroke: Secondary | ICD-10-CM

## 2016-12-26 DIAGNOSIS — B1920 Unspecified viral hepatitis C without hepatic coma: Secondary | ICD-10-CM | POA: Diagnosis present

## 2016-12-26 DIAGNOSIS — I3139 Other pericardial effusion (noninflammatory): Secondary | ICD-10-CM

## 2016-12-26 DIAGNOSIS — Z8249 Family history of ischemic heart disease and other diseases of the circulatory system: Secondary | ICD-10-CM

## 2016-12-26 DIAGNOSIS — J984 Other disorders of lung: Secondary | ICD-10-CM

## 2016-12-26 HISTORY — PX: PERICARDIOCENTESIS: CATH118255

## 2016-12-26 LAB — COMPREHENSIVE METABOLIC PANEL
ALK PHOS: 117 U/L (ref 38–126)
ALT: 38 U/L (ref 14–54)
ANION GAP: 4 — AB (ref 5–15)
AST: 115 U/L — ABNORMAL HIGH (ref 15–41)
Albumin: 2.4 g/dL — ABNORMAL LOW (ref 3.5–5.0)
BUN: 21 mg/dL — ABNORMAL HIGH (ref 6–20)
CALCIUM: 8.3 mg/dL — AB (ref 8.9–10.3)
CO2: 24 mmol/L (ref 22–32)
Chloride: 101 mmol/L (ref 101–111)
Creatinine, Ser: 0.85 mg/dL (ref 0.44–1.00)
Glucose, Bld: 77 mg/dL (ref 65–99)
Potassium: 4.9 mmol/L (ref 3.5–5.1)
SODIUM: 129 mmol/L — AB (ref 135–145)
TOTAL PROTEIN: 7.6 g/dL (ref 6.5–8.1)
Total Bilirubin: 1.7 mg/dL — ABNORMAL HIGH (ref 0.3–1.2)

## 2016-12-26 LAB — BODY FLUID CELL COUNT WITH DIFFERENTIAL
Eos, Fluid: 0 %
Lymphs, Fluid: 31 %
Monocyte-Macrophage-Serous Fluid: 67 % (ref 50–90)
Neutrophil Count, Fluid: 2 % (ref 0–25)
WBC FLUID: 954 uL (ref 0–1000)
WBC FLUID: 4 uL (ref 0–1000)

## 2016-12-26 LAB — CBC WITH DIFFERENTIAL/PLATELET
BASOS ABS: 0 10*3/uL (ref 0.0–0.1)
Basophils Relative: 0 %
EOS ABS: 0 10*3/uL (ref 0.0–0.7)
EOS PCT: 0 %
HCT: 39.3 % (ref 36.0–46.0)
Hemoglobin: 14.2 g/dL (ref 12.0–15.0)
LYMPHS ABS: 1.8 10*3/uL (ref 0.7–4.0)
Lymphocytes Relative: 17 %
MCH: 30.9 pg (ref 26.0–34.0)
MCHC: 36.1 g/dL — ABNORMAL HIGH (ref 30.0–36.0)
MCV: 85.4 fL (ref 78.0–100.0)
Monocytes Absolute: 3.8 10*3/uL — ABNORMAL HIGH (ref 0.1–1.0)
Monocytes Relative: 36 %
Neutro Abs: 4.9 10*3/uL (ref 1.7–7.7)
Neutrophils Relative %: 46 %
PLATELETS: 103 10*3/uL — AB (ref 150–400)
RBC: 4.6 MIL/uL (ref 3.87–5.11)
RDW: 15.9 % — AB (ref 11.5–15.5)
WBC: 10.5 10*3/uL (ref 4.0–10.5)

## 2016-12-26 LAB — APTT: aPTT: 39 seconds — ABNORMAL HIGH (ref 24–36)

## 2016-12-26 LAB — CBC
HEMATOCRIT: 38.5 % (ref 36.0–46.0)
HEMOGLOBIN: 13.8 g/dL (ref 12.0–15.0)
MCH: 30.7 pg (ref 26.0–34.0)
MCHC: 35.8 g/dL (ref 30.0–36.0)
MCV: 85.7 fL (ref 78.0–100.0)
Platelets: 108 10*3/uL — ABNORMAL LOW (ref 150–400)
RBC: 4.49 MIL/uL (ref 3.87–5.11)
RDW: 15.8 % — ABNORMAL HIGH (ref 11.5–15.5)
WBC: 9.7 10*3/uL (ref 4.0–10.5)

## 2016-12-26 LAB — GRAM STAIN

## 2016-12-26 LAB — PROTIME-INR
INR: 1.23
PROTHROMBIN TIME: 15.6 s — AB (ref 11.4–15.2)

## 2016-12-26 LAB — LACTATE DEHYDROGENASE, PLEURAL OR PERITONEAL FLUID: LD, Fluid: 91 U/L — ABNORMAL HIGH (ref 3–23)

## 2016-12-26 LAB — ECHOCARDIOGRAM COMPLETE
HEIGHTINCHES: 63 in
Weight: 2179.91 oz

## 2016-12-26 LAB — RAPID URINE DRUG SCREEN, HOSP PERFORMED
AMPHETAMINES: NOT DETECTED
BARBITURATES: NOT DETECTED
Benzodiazepines: NOT DETECTED
COCAINE: POSITIVE — AB
OPIATES: NOT DETECTED
TETRAHYDROCANNABINOL: NOT DETECTED

## 2016-12-26 LAB — CREATININE, SERUM
CREATININE: 0.88 mg/dL (ref 0.44–1.00)
GFR calc Af Amer: >60 mL/min (ref >60)
GFR calc non Af Amer: >60 mL/min (ref >60)

## 2016-12-26 LAB — PROTEIN, PLEURAL OR PERITONEAL FLUID: TOTAL PROTEIN, FLUID: 3.8 g/dL

## 2016-12-26 LAB — LACTATE DEHYDROGENASE: LDH: 305 U/L — ABNORMAL HIGH (ref 98–192)

## 2016-12-26 LAB — GLUCOSE, RANDOM: GLUCOSE: 97 mg/dL (ref 65–99)

## 2016-12-26 SURGERY — PERICARDIOCENTESIS
Anesthesia: LOCAL

## 2016-12-26 MED ORDER — HEPARIN (PORCINE) IN NACL 2-0.9 UNIT/ML-% IJ SOLN
INTRAMUSCULAR | Status: DC | PRN
  Administered 2016-12-26: 1000 mL

## 2016-12-26 MED ORDER — LISINOPRIL 20 MG PO TABS
20.0000 mg | ORAL_TABLET | Freq: Every day | ORAL | Status: DC
  Administered 2016-12-26: 20 mg via ORAL
  Filled 2016-12-26: qty 1

## 2016-12-26 MED ORDER — ENOXAPARIN SODIUM 40 MG/0.4ML ~~LOC~~ SOLN
40.0000 mg | SUBCUTANEOUS | Status: DC
  Administered 2016-12-26: 40 mg via SUBCUTANEOUS
  Filled 2016-12-26: qty 0.4

## 2016-12-26 MED ORDER — ONDANSETRON HCL 4 MG/2ML IJ SOLN
4.0000 mg | Freq: Four times a day (QID) | INTRAMUSCULAR | Status: DC | PRN
  Administered 2016-12-28 (×2): 4 mg via INTRAVENOUS

## 2016-12-26 MED ORDER — SODIUM CHLORIDE 0.9% FLUSH
3.0000 mL | Freq: Two times a day (BID) | INTRAVENOUS | Status: DC
  Administered 2016-12-26 – 2016-12-29 (×6): 3 mL via INTRAVENOUS
  Administered 2016-12-29: 10 mL via INTRAVENOUS
  Administered 2016-12-30 – 2016-12-31 (×4): 3 mL via INTRAVENOUS

## 2016-12-26 MED ORDER — MIDAZOLAM HCL 2 MG/2ML IJ SOLN
INTRAMUSCULAR | Status: DC | PRN
  Administered 2016-12-26: 1 mg via INTRAVENOUS

## 2016-12-26 MED ORDER — SODIUM CHLORIDE 0.9% FLUSH: 3.0000 mL | INTRAVENOUS | Status: DC | PRN

## 2016-12-26 MED ORDER — MOMETASONE FURO-FORMOTEROL FUM 200-5 MCG/ACT IN AERO
1.0000 | INHALATION_SPRAY | Freq: Two times a day (BID) | RESPIRATORY_TRACT | Status: DC
  Administered 2016-12-26 – 2016-12-30 (×7): 1 via RESPIRATORY_TRACT
  Filled 2016-12-26 (×3): qty 8.8

## 2016-12-26 MED ORDER — LIDOCAINE HCL (PF) 1 % IJ SOLN
INTRAMUSCULAR | Status: AC
Start: 2016-12-26 — End: 2016-12-26
  Filled 2016-12-26: qty 30

## 2016-12-26 MED ORDER — LIDOCAINE HCL (PF) 1 % IJ SOLN
INTRAMUSCULAR | Status: DC | PRN
  Administered 2016-12-26: 20 mL

## 2016-12-26 MED ORDER — FUROSEMIDE 10 MG/ML IJ SOLN
40.0000 mg | Freq: Two times a day (BID) | INTRAMUSCULAR | Status: DC
  Administered 2016-12-26 (×2): 40 mg via INTRAVENOUS
  Filled 2016-12-26 (×2): qty 4

## 2016-12-26 MED ORDER — CYANOCOBALAMIN 250 MCG PO TABS
250.0000 ug | ORAL_TABLET | Freq: Every day | ORAL | Status: DC
  Administered 2016-12-26: 250 ug via ORAL
  Filled 2016-12-26 (×2): qty 1

## 2016-12-26 MED ORDER — ASPIRIN EC 81 MG PO TBEC
81.0000 mg | DELAYED_RELEASE_TABLET | Freq: Every day | ORAL | Status: DC
  Administered 2016-12-26 – 2016-12-31 (×6): 81 mg via ORAL
  Filled 2016-12-26 (×6): qty 1

## 2016-12-26 MED ORDER — ALBUTEROL SULFATE (2.5 MG/3ML) 0.083% IN NEBU: 2.5000 mg | INHALATION_SOLUTION | RESPIRATORY_TRACT | Status: DC | PRN

## 2016-12-26 MED ORDER — POTASSIUM CHLORIDE CRYS ER 20 MEQ PO TBCR
20.0000 meq | EXTENDED_RELEASE_TABLET | Freq: Two times a day (BID) | ORAL | Status: DC
  Administered 2016-12-26 (×2): 20 meq via ORAL
  Filled 2016-12-26 (×2): qty 1

## 2016-12-26 MED ORDER — SODIUM CHLORIDE 0.9 % IV SOLN: 250.0000 mL | INTRAVENOUS | Status: DC | PRN

## 2016-12-26 MED ORDER — VITAMIN B-1 100 MG PO TABS
250.0000 mg | ORAL_TABLET | Freq: Every day | ORAL | Status: DC
  Administered 2016-12-26 – 2016-12-31 (×6): 250 mg via ORAL
  Filled 2016-12-26 (×6): qty 3

## 2016-12-26 MED ORDER — MORPHINE SULFATE (PF) 4 MG/ML IV SOLN
2.0000 mg | INTRAVENOUS | Status: DC | PRN
  Administered 2016-12-26 – 2016-12-30 (×7): 2 mg via INTRAVENOUS
  Filled 2016-12-26 (×9): qty 1

## 2016-12-26 MED ORDER — ONDANSETRON HCL 4 MG PO TABS: 4.0000 mg | ORAL_TABLET | Freq: Four times a day (QID) | ORAL | Status: DC | PRN

## 2016-12-26 MED ORDER — FENTANYL CITRATE (PF) 100 MCG/2ML IJ SOLN
INTRAMUSCULAR | Status: DC | PRN
  Administered 2016-12-26: 25 ug via INTRAVENOUS

## 2016-12-26 MED ORDER — HYDROXYZINE HCL 10 MG PO TABS
10.0000 mg | ORAL_TABLET | Freq: Three times a day (TID) | ORAL | Status: DC | PRN
  Filled 2016-12-26: qty 1

## 2016-12-26 MED ORDER — BISACODYL 10 MG RE SUPP
10.0000 mg | Freq: Every day | RECTAL | Status: DC | PRN
  Administered 2016-12-30: 10 mg via RECTAL
  Filled 2016-12-26: qty 1

## 2016-12-26 MED ORDER — SODIUM CHLORIDE 0.9 % IV SOLN
INTRAVENOUS | Status: AC
  Administered 2016-12-26: 23:00:00 via INTRAVENOUS

## 2016-12-26 MED ORDER — MIDAZOLAM HCL 2 MG/2ML IJ SOLN
INTRAMUSCULAR | Status: AC
  Filled 2016-12-26: qty 2

## 2016-12-26 MED ORDER — TRAMADOL HCL 50 MG PO TABS
50.0000 mg | ORAL_TABLET | Freq: Four times a day (QID) | ORAL | Status: DC | PRN
  Administered 2016-12-26 – 2016-12-30 (×5): 50 mg via ORAL
  Filled 2016-12-26 (×5): qty 1

## 2016-12-26 MED ORDER — ALBUMIN HUMAN 25 % IV SOLN
50.0000 g | Freq: Once | INTRAVENOUS | Status: AC
  Administered 2016-12-26: 50 g via INTRAVENOUS
  Filled 2016-12-26: qty 200

## 2016-12-26 MED ORDER — ONDANSETRON HCL 4 MG/2ML IJ SOLN
4.0000 mg | Freq: Four times a day (QID) | INTRAMUSCULAR | Status: DC | PRN
  Filled 2016-12-26 (×3): qty 2

## 2016-12-26 MED ORDER — ACETAMINOPHEN 650 MG RE SUPP
650.0000 mg | Freq: Four times a day (QID) | RECTAL | Status: DC | PRN
Start: 2016-12-26 — End: 2016-12-26

## 2016-12-26 MED ORDER — HEPARIN SODIUM (PORCINE) 5000 UNIT/ML IJ SOLN
5000.0000 [IU] | Freq: Three times a day (TID) | INTRAMUSCULAR | Status: DC
  Administered 2016-12-27: 5000 [IU] via SUBCUTANEOUS
  Filled 2016-12-26: qty 1

## 2016-12-26 MED ORDER — PROCHLORPERAZINE MALEATE 10 MG PO TABS
10.0000 mg | ORAL_TABLET | Freq: Four times a day (QID) | ORAL | Status: DC | PRN
  Filled 2016-12-26: qty 1

## 2016-12-26 MED ORDER — IPRATROPIUM-ALBUTEROL 0.5-2.5 (3) MG/3ML IN SOLN
3.0000 mL | Freq: Once | RESPIRATORY_TRACT | Status: AC
  Administered 2016-12-26: 3 mL via RESPIRATORY_TRACT
  Filled 2016-12-26: qty 3

## 2016-12-26 MED ORDER — HEPARIN (PORCINE) IN NACL 2-0.9 UNIT/ML-% IJ SOLN
INTRAMUSCULAR | Status: AC
  Filled 2016-12-26: qty 1000

## 2016-12-26 MED ORDER — ACETAMINOPHEN 325 MG PO TABS: 650.0000 mg | ORAL_TABLET | Freq: Four times a day (QID) | ORAL | Status: DC | PRN

## 2016-12-26 MED ORDER — FAMOTIDINE 20 MG PO TABS: 10.0000 mg | ORAL_TABLET | Freq: Two times a day (BID) | ORAL | Status: DC

## 2016-12-26 MED ORDER — FENTANYL CITRATE (PF) 100 MCG/2ML IJ SOLN
INTRAMUSCULAR | Status: AC
  Filled 2016-12-26: qty 2

## 2016-12-26 MED ORDER — ACETAMINOPHEN 325 MG PO TABS: 650.0000 mg | ORAL_TABLET | ORAL | Status: DC | PRN

## 2016-12-26 SURGICAL SUPPLY — 3 items
EVACUATOR 1/8 PVC DRAIN (DRAIN) ×2 IMPLANT
PACK CARDIAC CATHETERIZATION (CUSTOM PROCEDURE TRAY) ×1 IMPLANT
PERIVAC PERICARDIOCENTESIS 8.3 (TRAY / TRAY PROCEDURE) ×1 IMPLANT

## 2016-12-26 NOTE — H&P (Addendum)
TRH H&P   Patient Demographics:    Calysta Craigo, is a 61 y.o. female  MRN: [id number]   DOB - Feb 19, 1956  Admit Date - 01/09/2017  Outpatient Primary MD for the patient is Marina Goodell, MD  Referring MD: Dr. Christy Gentles  Outpatient Specialists: Dr.Feng  Patient coming from: Home  Chief Complaint  Patient presents with  . Leg Swelling      HPI:    Gisel Vipond  is a 61 y.o. female, With history of hepatocellular carcinoma (diagnosed in November 2017, currently on Nivolumab every 2 weeks), portal vein thrombosis, hepatitis C (treated),  ongoing tobacco and alcohol abuse (reports cutting down significantly), cocaine and marijuana use, chronic back pain who presented to the ED with increased leg swelling along with increased fatigue, swelling of her abdomen and breast for past 2 weeks. She also reports cough with whitish phlegm. She was seen by her oncologist last week who thought this was likely due to her liver cancer with cirrhosis. She wrote her prescription for Lasix and potassium supplement with plan on repeat scan in 4 weeks. Patient reports having a Pap smear recently which was unremarkable. Patient denies any headache, blurred vision, fever, chills, dizziness, chest pain, palpitations, orthopnea or PND, diarrhea or dysuria. Patient reports nausea and being constipated.  Patient returned to the ED with increased swelling with some pain in her bilateral lower extremities. Also reported increased swelling in her face, abdomen, breast, labia for almost 2 weeks. She also reports almost 5 pound weight gain recently.  In the ED vitals were stable. Given her increased dyspnea she was given trial of nebulizer. Labs showed WBC of 10.5, normal hemoglobin, platelets of 103, sodium of 129, BUN of 21 and creatinine 0.85. INR was 1.23. Chest x-ray showed moderate left-sided pleural effusion  with atelectasis versus consolidation.    Review of systems:    In addition to the HPI above,(Positive symptoms in bold) No Fever-chills, No Headache, No changes with Vision or hearing, No problems swallowing food or Liquids, No Chest pain, Cough And Shortness of Breath exertion, Nausea present, abdominal swelling and constipation No Abdominal pain, No vomiting No Blood in stool or Urine, No dysuria, No new skin rashes or bruises, No new joints pains-aches,  Increased weakness,, no tingling, numbness in any extremity, Recent weight gain (5 pounds) No polyuria, polydypsia or polyphagia, No significant Mental Stressors.  A full 10 point Review of Systems was done, except as stated above, all other Review of Systems were negative.   With Past History of the following :    Past Medical History:  Diagnosis Date  . Alcohol abuse, daily use   . Cirrhosis of liver ([place])   . Helicobacter pylori gastritis   . Hepatitis C   . Hepatocellular carcinoma ([place]) dx'd 08/2016  . HTN (hypertension)   . Hx of cardiovascular stress test    [place]  Myoview 3/16: No ischemia, EF 66%, normal study  . Leg pain    ABIs 3/16:  normal   . Mitral valve prolapse   . Osteopetrosis   . Poor dental hygiene   . Tobacco use       Past Surgical History:  Procedure Laterality Date  . ABDOMINAL HYSTERECTOMY    . CARPAL TUNNEL RELEASE    . IR GENERIC HISTORICAL  09/28/2016   IR RADIOLOGIST EVAL & MGMT 09/28/2016 Sandi Mariscal, MD [facility] INTERV RAD  . LAPAROSCOPY    . rod in right arm        Social History:     Social History  Substance Use Topics  . Smoking status: Current Every Day Smoker    Packs/day: 0.50    Years: 46.00    Types: Cigarettes    Start date: 09/26/1969  . Smokeless tobacco: Never Used     Comment: has cut back about 1/2 pk per day  . Alcohol use 25.2 oz/week    42 Standard drinks or equivalent per week     Comment: 40 oz beer a day      Lives - At home  Mobility -  ambulatory   Family History :     Family History  Problem Relation Age of Onset  . Cancer Father     Prostate  . Heart disease Mother   . Stroke Sister   . Heart disease Sister   . Lupus Sister   . Heart attack Neg Hx   . Colon cancer Neg Hx   . Colon polyps Neg Hx       Home Medications:   Prior to Admission medications   Medication Sig Start Date End Date Taking? Authorizing Provider  aspirin 81 MG tablet Take 81 mg by mouth daily.   Yes Historical Provider, MD  hydrochlorothiazide (HYDRODIURIL) 25 MG tablet Take 1 tablet (25 mg total) by mouth daily. 08/30/16  Yes Jettie Booze, MD  hydrOXYzine (ATARAX/VISTARIL) 10 MG tablet Take 1 tablet (10 mg total) by mouth 3 (three) times daily as needed for anxiety (or sleep). 12/07/16  Yes Alyssa A Haney, MD  lisinopril (PRINIVIL,ZESTRIL) 20 MG tablet Take 1 tablet (20 mg total) by mouth daily. 11/25/16  Yes Smiley Houseman, MD  mometasone-formoterol (DULERA) 200-5 MCG/ACT AERO Inhale 1 puff into the lungs 2 (two) times daily. 12/07/16  Yes Alyssa A Haney, MD  Potassium 99 MG TABS Take 99 mg by mouth daily.   Yes Historical Provider, MD  prochlorperazine (COMPAZINE) 10 MG tablet Take 1 tablet (10 mg total) by mouth every 6 (six) hours as needed for nausea or vomiting. 11/17/16  Yes Truitt Merle, MD  Thiamine HCl (VITAMIN B-1) 250 MG tablet Take 1 tablet (250 mg total) by mouth daily. 06/10/16  Yes Alyssa A Lincoln Brigham, MD  traMADol (ULTRAM) 50 MG tablet Take 1 tablet (50 mg total) by mouth every 6 (six) hours as needed. 12/08/16  Yes Truitt Merle, MD  vitamin B-12 (CYANOCOBALAMIN) 250 MCG tablet Take 1 tablet (250 mcg total) by mouth daily. Patient taking differently: Take 500 mcg by mouth daily.  01/01/16  Yes York Ram Melancon, MD  amLODipine (NORVASC) 10 MG tablet Take 1 tablet (10 mg total) by mouth daily. Patient not taking: Reported on 12/22/2016 08/30/16   Jettie Booze, MD  docusate sodium (COLACE) 100 MG capsule Take 1 capsule (100 mg  total) by mouth 2 (two) times daily. Patient not taking: Reported on 01/12/2017 11/25/16  Smiley Houseman, MD  ferrous sulfate 325 (65 FE) MG tablet Take 1 tablet (325 mg total) by mouth daily with breakfast. Patient not taking: Reported on 12/31/2016 01/09/15   Leone Haven, MD  folic acid (FOLVITE) 1 MG tablet Take 1 tablet (1 mg total) by mouth daily. Patient not taking: Reported on 01/16/2017 08/03/16   Veatrice Bourbon, MD  furosemide (LASIX) 20 MG tablet Take 1 tablet (20 mg total) by mouth daily. Patient not taking: Reported on 01/10/2017 12/22/16   Truitt Merle, MD  methocarbamol (ROBAXIN) 500 MG tablet Take 1 tablet (500 mg total) by mouth 2 (two) times daily. Patient not taking: Reported on 01/03/2017 10/11/16   Lacretia Leigh, MD  naphazoline-pheniramine (NAPHCON-A) 0.025-0.3 % ophthalmic solution Place 1 drop into both eyes every 4 (four) hours as needed for irritation. Patient not taking: Reported on 01/11/2017 06/02/16   Konrad Felix, PA  potassium chloride SA (K-DUR,KLOR-CON) 20 MEQ tablet Take 1 tablet (20 mEq total) by mouth 2 (two) times daily. Patient not taking: Reported on 01/19/2017 11/03/16   Truitt Merle, MD     Allergies:     Allergies  Allergen Reactions  . Celecoxib Hives  . Sulfonamide Derivatives Hives, Rash and Other (See Comments)    Sulfamethoxazole      Physical Exam:   Vitals  Blood pressure 133/73, pulse 94, temperature 97.9 F (36.6 C), temperature source Oral, resp. rate 12, weight (S) 61.3 kg (135 lb 2 oz), SpO2 95 %.   1. General Middle aged female lying in bed not in distress HEENT: No pallor, no icterus, moist mucosa, supple neck Chest: Diminished breath sounds over left lung base, no rhonchi, wheeze or crackles CVS: Normal S1 and S2, no murmurs rub or gallop GI: Soft, mild distention with ascites, nontender, bowel sounds present Musculoskeletal: 1+ pitting edema bilaterally, nontender, no skin rash or bruise. CNS: Alert and oriented, no tremors       Data Review:    CBC  Recent Labs Lab 12/22/16 1205 01/21/2017 0445  WBC 7.6 10.5  HGB 15.2 14.2  HCT 42.8 39.3  PLT 108* 103*  MCV 88.4 85.4  MCH 31.4 30.9  MCHC 35.5 36.1*  RDW 16.0* 15.9*  LYMPHSABS 2.3 1.8  MONOABS 2.3* 3.8*  EOSABS 0.0 0.0  BASOSABS 0.0 0.0   ------------------------------------------------------------------------------------------------------------------  Chemistries   Recent Labs Lab 12/22/16 1205 01/19/2017 0445  NA 128* 129*  K 4.6 4.9  CL  --  101  CO2 22 24  GLUCOSE 105 77  BUN 13.2 21*  CREATININE 0.8 0.85  CALCIUM 8.3* 8.3*  AST 79* 115*  ALT 26 38  ALKPHOS 108 117  BILITOT 1.55* 1.7*   ------------------------------------------------------------------------------------------------------------------ estimated creatinine clearance is 57.5 mL/min (by C-G formula based on SCr of 0.85 mg/dL). ------------------------------------------------------------------------------------------------------------------ No results for input(s): TSH, T4TOTAL, T3FREE, THYROIDAB in the last 72 hours.  Invalid input(s): FREET3  Coagulation profile  Recent Labs Lab 12/30/2016 0445  INR 1.23   ------------------------------------------------------------------------------------------------------------------- No results for input(s): DDIMER in the last 72 hours. -------------------------------------------------------------------------------------------------------------------  Cardiac Enzymes No results for input(s): CKMB, TROPONINI, MYOGLOBIN in the last 168 hours.  Invalid input(s): CK ------------------------------------------------------------------------------------------------------------------    Component Value Date/Time   BNP 106.4 (H) 07/28/2016 1617     ---------------------------------------------------------------------------------------------------------------  Urinalysis    Component Value Date/Time   COLORURINE YELLOW 10/11/2016  1834   APPEARANCEUR CLEAR 10/11/2016 1834   LABSPEC 1.016 10/11/2016 1834   PHURINE 7.0 10/11/2016 1834   GLUCOSEU NEGATIVE 10/11/2016 1834  HGBUR NEGATIVE 10/11/2016 [place] 10/11/2016 [place] 10/11/2016 1834   PROTEINUR 30 (A) 10/11/2016 1834   UROBILINOGEN 1.0 06/01/2011 1123   NITRITE NEGATIVE 10/11/2016 1834   LEUKOCYTESUR NEGATIVE 10/11/2016 1834    ----------------------------------------------------------------------------------------------------------------   Imaging Results:    Dg Chest 2 View  Result Date: 01/20/2017 CLINICAL DATA:  Bilateral lower extremity swelling. Slight improvement with recent initiation of diuretic. EXAM: CHEST  2 VIEW COMPARISON:  07/28/2016 FINDINGS: Moderate left pleural effusion. Mild atelectasis or consolidation adjacent to the pleural effusion. The right lung is clear. Pulmonary vasculature is normal. IMPRESSION: New left pleural effusion with adjacent left lung base atelectasis or consolidation. Electronically Signed   By: Andreas Newport M.D.   On: 01/03/2017 04:21    My personal review of EKG: not done   Assessment & Plan:    Principal Problem:   Pleural effusion on left New findings. Differential includes malignant pleural effusion versus transudative versus parapneumonic effusion. Patient maintaining O2 sat on room air. Ordered ultrasound thoracentesis. Follow labs for cell count, cultures and cytology. (Check serum LDH and protein) I will obtain a repeat 2-D echo.. I would place her on empiric doxycycline for possible underlying pneumonia..  Active Problems: Hepatocellular carcinoma stage II Was diagnosed in November 2017. Patient is on Nivolumab every 2 weeks. Recent restaging CT showed disease progression in the liver. The goal of therapy is palliative. Also patient's inadequate insurance and inability to afford medications have been a barrier in her treatment.  Cirrhosis of liver with  ascites Likely associated with the liver cancer. Does not need paracentesis at this time. I will put her on Lasix 40 mg twice daily and also give her a does of IV albumin. If symptoms do not improve she should receive therapeutic paracentesis tomorrow.  Polysubstance abuse Patient reports she is cutting down on smoking (6-7 cigarettes a day), also has been cutting down on alcohol ( 12 ounces beer every other day), smokes marijuana daily (says it helps her with her pain and stimulus appetite). Last cocaine use was 2 months ago. Counseled strongly on cessation. Monitor on CIWA. Check urine drug screen. Will order nicotine patch.  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 Her oncologist has decided not to put her on anticoagulation since the bleeding risk outweighs benefit)  Hyponatremia Suspected due to liver cirrhosis with ascites and HCTZ use. HCTZ was discontinued by her oncologist recently.  Chronic back pain Patient is on tramadol which is continued. Do not escalate pain medications. Her oncologist was planning to refer her to pain clinic if needed.  DVT Prophylaxis Lovenox  AM Labs Ordered, also please review Full Orders  Family Communication: Admission, patients condition and plan of care including tests being ordered have been discussed with the patient at bedside  Code Status full code  Likely DC to  home  Condition fair  Consults called: None  Admission status: Observation    Time spent in minutes : 60   Louellen Molder M.D on 12/31/2016 at 8:28 AM  Between 7am to 7pm - Pager - [phone]. After 7pm go to [email or web address] - password [facility]  [facility] - Office  [phone]

## 2016-12-26 NOTE — Progress Notes (Addendum)
Notified by cardiologist Dr. Debara Pickett has the that patient's echo shows significant pericardial effusion with tamponade physiology. Also noted for mass on the RV free wall.  transfer patient to stepdown at [facility]. Cardiology consulted for evaluation.  Sign out given to family medicine teaching service resident who are patient PCP as outpt. Dr Wendy Poet will be the attending for the patient.

## 2016-12-26 NOTE — Progress Notes (Signed)
Family Medicine Teaching Service Daily Progress Note Intern Pager: [phone]  Patient name: Lisa [name] Medical record number: [id number] Date of birth: 08-Apr-1956 Age: 61 y.o. Gender: female  Primary Care Provider: Marina Goodell, MD Consultants: Cardiology  Code Status: Full   Pt Overview and Major Events to Date:  4/2: Admit to [facility] in the morning, received left thoracentesis, transferred to [facility] for pericardiocentesis  Transfer of care from [facility] at [facility] to family medicine teaching service at [facility].  Assessment and Plan: Lisa [name] is a 61 year old female presenting withworsening leg swelling, fatigue, abdominal and breast swelling x 2 weeks, found to have large left pleural effusion S/P thoracentesis on 4/2 at [facility]. Also found to have pericardial effusion concerning for cardiac tamponade on, transferred to [facility] for pericardiocentesis. Past medical history significant for hepatocellular carcinoma, portal vein thrombosis, hepatitis C, polysubstance abuse, chronic back pain.  Patient admitted to [facility] and was transferred to [facility] on April 2 for further care.  Dyspnea secondary to left-sided pleural effusion, s/p thoracentesis: Dyspnea improving. Likely malignant pleural effusion versus transudative. Status post thoracentesis on 4/2 yielding 800 mL of clear yellow fluid. Labs collected. Gram stain of fluid showing no organisms. Repeat chest x-ray showing improved effusion after paracentesis. -Vital signs per for protocol -Continue to monitor respiratory status -Can consider repeat chest x-ray dyspnea returns -Follow-up on thoracentesis studies- calculate lights criteria  Enlarging pericardial effusion with concerns for tamponade: Seen on echocardiogram on April 2. Likely contributing to dyspnea. Vital signs stable at this time. 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 Possibly malignant in etiology given history of hepatocellular carcinoma -Cardiology consulted, appreciate recommendations  Multifocal Hepatocellular carcinoma, stage II: Diagnosed in November 2017 and is on Nivolumab every 2 weeks with oncologist. Lisa [name] started on 11/24/16. Status post IV albumin and IV Lasix at [facility]. -Continue to monitor vital signs and edema -After thoracentesis and pericardiocentesis, will be cautious with diuretics at this time as blood pressure soft with diastolics in the 74-25Z.  Liver cirrhosis with ascites, Hx of Treated Hep C: Secondary to liver cancer, no signs of SBP. AST 1 elevated at 115. ALT and ALP normal. -Decreasing Lasix to 20 mg IV due to blood pressure and thoracentesis and pericardiocentesis -Consider increasing Lasix if patient's blood pressure tolerates and if she continues to have ascites - Daily CMP  Polysubstance abuse: History of tobacco abuse as well as alcohol, cocaine, and marijuana. UDS positive for cocaine on admission. - Consult CSW  Portal vein tumor thrombosis: Secondary to hepatocellular carcinoma. Not on chronic anticoagulation per her discussion with oncology with risks outweighing benefits  HTN: BP slightly soft with systolic in 56-38V. Home medications include lisinopril and Amlodipine - Hold home antihypertensives, continue to watch BP closely - Lasix as above  Chronic back pain -Continue home tramadol  Thrombocytopenia: Platelets 103 likely secondary to hepatic cirrhosis. INR at 1.23 with slightly elevated PT and PTT. -Will need to be cautious and monitor platelets daily especially while on heparin -monitor for any signs of bleeding  Hyponatremia: Likely secondary to cirrhosis and or HCTZ use which was recently discontinued by her oncologist. -Daily CMP  Potassium: K normal. On KDUR  at home. - Hold home [place] - Daily CMP  Possible COPD: On dulera and albuterol at home. - Resume home meds  FEN/GI: Clear  liquid diet, saline lock IV PPx: Heparin  Disposition: Admit to family practice teaching service, stepdown unit  Subjective:  Patient seen after her pericardiocentesis. Did not receive a page that patient had arrived to floor so patient seen later than desired. She is clenching pillow to her chest, states pain is controlled but she is very tired. States shortness of breath is better than before.   Objective: Temp:  [97.8 F (36.6 C)-98.4 F (36.9 C)] 97.8 F (36.6 C) (04/02 1950) Pulse Rate:  [80-98] 85 (04/02 2000) Resp:  [11-22] 20 (04/02 2000) BP: (126-160)/(50-98) 140/50 (04/02 2000) SpO2:  [94 %-100 %] 100 % (04/02 2000) Weight:  [135 lb 2 oz (61.3 kg)-136 lb 11 oz (62 kg)] 136 lb 11 oz (62 kg) (04/02 1950) Physical Exam: General: In NAD, sitting up in bed with eyes closed Cardiovascular: regular rate and rhythm, 1+ pitting edema in ankles, palpable DP pulses bilaterally Chest: Drain on anterior chest Respiratory: normal work of breathing  Abdomen: soft, slight distention, normal bowel sounds Extremities: 1+ pitting edema in ankles  Laboratory:  Recent Labs Lab 12/22/16 1205 01/16/2017 0445  WBC 7.6 10.5  HGB 15.2 14.2  HCT 42.8 39.3  PLT 108* 103*    Recent Labs Lab 12/22/16 1205 01/06/2017 0445  NA 128* 129*  K 4.6 4.9  CL  --  101  CO2 22 24  BUN 13.2 21*  CREATININE 0.8 0.85  CALCIUM 8.3* 8.3*  PROT 7.4 7.6  BILITOT 1.55* 1.7*  ALKPHOS 108 117  ALT 26 38  AST 79* 115*  GLUCOSE 105 77    Imaging/Diagnostic Tests: Dg Chest 1 View  Result Date: 12/31/2016 CLINICAL DATA:  Status post left thoracentesis. EXAM: CHEST 1 VIEW COMPARISON:  Radiographs of same day. FINDINGS: Stable cardiomegaly. Atherosclerosis of thoracic aorta is noted. No pneumothorax is seen status post left thoracentesis. Left pleural effusion is significantly  smaller. Left basilar atelectasis or infiltrate is noted. Mild right basilar atelectasis is noted as well. IMPRESSION: No pneumothorax status post left-sided thoracentesis. Left pleural effusion is significantly smaller. Electronically Signed   By: Marijo Conception, M.D.   On: 01/18/2017 15:53   Dg Chest 2 View  Result Date: 12/27/2016 CLINICAL DATA:  Bilateral lower extremity swelling. Slight improvement with recent initiation of diuretic. EXAM: CHEST  2 VIEW COMPARISON:  07/28/2016 FINDINGS: Moderate left pleural effusion. Mild atelectasis or consolidation adjacent to the pleural effusion. The right lung is clear. Pulmonary vasculature is normal. IMPRESSION: New left pleural effusion with adjacent left lung base atelectasis or consolidation. Electronically Signed   By: Andreas Newport M.D.   On: 12/28/2016 04:21   US Thoracentesis Asp Pleural Space W/img Guide  Result Date: 01/06/2017 INDICATION: Patient with history of hepatocellular carcinoma and cirrhosis presents with left pleural effusion. Request is made for diagnostic and therapeutic left thoracentesis. EXAM: ULTRASOUND GUIDED DIAGNOSTIC AND THERAPEUTIC LEFT THORACENTESIS MEDICATIONS: 10 mL 1% lidocaine COMPLICATIONS: None immediate. PROCEDURE: An ultrasound guided thoracentesis was thoroughly discussed with the patient and questions answered. The benefits, risks, alternatives and complications were also discussed. The patient understands and wishes to proceed with the procedure. Written consent was obtained. Ultrasound was performed to localize and mark an adequate pocket of fluid in the left chest. The area was then prepped and draped in the normal sterile fashion. 1% Lidocaine was used for local anesthesia. Under ultrasound guidance a Safe-T-centesis catheter was introduced. Thoracentesis was performed. The catheter was removed and a dressing applied. FINDINGS: A total of approximately 800  mL of clear, yellow fluid was removed. Samples were sent  to the laboratory as requested by the clinical team. IMPRESSION: Successful ultrasound guided diagnostic and therapeutic left thoracentesis yielding 800 mL of pleural fluid. Read by:  Brynda Greathouse PA-C Electronically Signed   By: Markus Daft M.D.   On: 01/09/2017 15:59     Carlyle Dolly, MD 01/21/2017, 8:47 PM PGY-2, [place] Intern pager: [phone], text pages welcome

## 2016-12-26 NOTE — Progress Notes (Signed)
  Echocardiogram 2D Echocardiogram has been performed.  Darlina Sicilian M 12/25/2016, 2:21 PM

## 2016-12-26 NOTE — Procedures (Signed)
PROCEDURE SUMMARY:  Successful US guided left thoracentesis. Yielded 800 mL of clear, yellow fluid. Pt tolerated procedure well. No immediate complications.  Specimen was sent for labs. CXR ordered.  Docia Barrier PA-C 01/07/2017 3:37 PM

## 2016-12-26 NOTE — Progress Notes (Signed)
Family Medicine Teaching Service Transfer Acceptance Note  Brief summary of reason for admission/transfer: shortness of breath, found to have a large pleural effusion.  Time accepted for transfer: 5:32 AM ED provider with whom patient discussed: Dr. Young Berry  Please note that patients transferring to [facility] under the [facility] Medicine Teaching Service must arrive at [facility] within two hours of acceptance for transfer.  Smiley Houseman, MD PGY-2, Family Medicine Teaching Service Service Pager [phone]

## 2016-12-26 NOTE — ED Provider Notes (Signed)
[facility] DEPT Provider Note   CSN: [id number] Arrival date & time: 12/28/2016  0308   By signing my name below, I, Eunice Blase, attest that this documentation has been prepared under the direction and in the presence of Ripley Fraise, MD. Electronically signed, Eunice Blase, ED Scribe. 01/11/2017. 3:58 AM.   History   Chief Complaint Chief Complaint  Patient presents with  . Leg Swelling   The history is provided by the patient and medical records. No language interpreter was used.  Leg Pain   This is a new problem. The current episode started more than 1 week ago. The problem occurs daily. The problem has been gradually worsening. Pain location: lower extremities. The pain is at a severity of 6/10. The pain is moderate. Associated symptoms include stiffness. The symptoms are aggravated by activity, standing and contact.    HPI Comments: Lisa [name] is a 61 y.o. female with Hx of liver cancer BIB EMS from home who presents to the Emergency Department complaining of swelling to the breasts, face, feet and pubic area x ~2 weeks. She states that she received her second round of chemotherapy to treat liver cancer just prior to the onset of her symptoms. Pt notes associated productive cough restricting her breathing, 6/10 tight R foot pain, fatigue, abdominal pain and recent significant weight gain. She states she was seen by her PCP 2 weeks ago when she noticed aforementioned pelvic region swelling, and she adds her PCP performed a pap smear at that time with no abnormal result reported. Pt reportedly started on lasix 12/21/2016.  Past Medical History:  Diagnosis Date  . Alcohol abuse, daily use   . Cirrhosis of liver ([place])   . Helicobacter pylori gastritis   . Hepatitis C   . Hepatocellular carcinoma ([place]) dx'd 08/2016  . HTN (hypertension)   . Hx of cardiovascular stress test    Lexiscan Myoview 3/16: No ischemia, EF 66%, normal study  . Leg pain    ABIs 3/16:  normal   .  Mitral valve prolapse   . Osteopetrosis   . Poor dental hygiene   . Tobacco use     Patient Active Problem List   Diagnosis Date Noted  . Restrictive lung disease 12/15/2016  . Skin irritation 12/15/2016  . Ascites 12/15/2016  . Labial swelling 12/15/2016  . Poor dentition 12/07/2016  . Goals of care, counseling/discussion 11/17/2016  . Hepatocellular carcinoma ([place]) 09/09/2016  . Cardiovascular disease 08/03/2016  . Health care maintenance 08/03/2016  . Aortic atherosclerosis ([place]) 07/28/2016  . Polysubstance abuse 07/28/2016  . Liver mass 06/10/2016  . Cirrhosis of liver without ascites ([place]) 05/26/2015  . Aching pain 03/10/2015  . Mitral regurgitation 02/16/2015  . Anemia 12/29/2014  . Abdominal pain 12/23/2014  . Thigh pain 12/10/2014  . Productive cough 12/10/2014  . Allergic rhinitis 11/07/2014  . Tobacco abuse 07/24/2014  . Heart murmur 07/24/2014  . Neuropathic pain 07/07/2014  . Hypertensive urgency 05/21/2014  . Back pain 05/21/2014  . Chest pain 03/31/2014  . Alcohol abuse 08/19/2013  . Anxiety state, unspecified 08/19/2013  . Depression 01/24/2013  . Osteopetrosis   . Hep C w/o coma, chronic ([place])   . HYPERTENSION, BENIGN 03/18/2010    Past Surgical History:  Procedure Laterality Date  . ABDOMINAL HYSTERECTOMY    . CARPAL TUNNEL RELEASE    . IR GENERIC HISTORICAL  09/28/2016   IR RADIOLOGIST EVAL & MGMT 09/28/2016 Sandi Mariscal, MD [facility] INTERV RAD  . LAPAROSCOPY    .  rod in right arm      OB History    Gravida Para Term Preterm AB Living   1 1 1     1    SAB TAB Ectopic Multiple Live Births                   Home Medications    Prior to Admission medications   Medication Sig Start Date End Date Taking? Authorizing Provider  amLODipine (NORVASC) 10 MG tablet Take 1 tablet (10 mg total) by mouth daily. Patient not taking: Reported on 12/22/2016 08/30/16   Jettie Booze, MD  aspirin 81 MG tablet Take 81 mg by mouth daily.    Historical  Provider, MD  docusate sodium (COLACE) 100 MG capsule Take 1 capsule (100 mg total) by mouth 2 (two) times daily. Patient taking differently: Take 100 mg by mouth daily as needed.  11/25/16   Smiley Houseman, MD  ferrous sulfate 325 (65 FE) MG tablet Take 1 tablet (325 mg total) by mouth daily with breakfast. 01/09/15   Leone Haven, MD  folic acid (FOLVITE) 1 MG tablet Take 1 tablet (1 mg total) by mouth daily. 08/03/16   Veatrice Bourbon, MD  furosemide (LASIX) 20 MG tablet Take 1 tablet (20 mg total) by mouth daily. 12/22/16   Truitt Merle, MD  hydrochlorothiazide (HYDRODIURIL) 25 MG tablet Take 1 tablet (25 mg total) by mouth daily. 08/30/16   Jettie Booze, MD  hydrOXYzine (ATARAX/VISTARIL) 10 MG tablet Take 1 tablet (10 mg total) by mouth 3 (three) times daily as needed for anxiety (or sleep). 12/07/16   Alyssa A Haney, MD  lisinopril (PRINIVIL,ZESTRIL) 20 MG tablet Take 1 tablet (20 mg total) by mouth daily. 11/25/16   Smiley Houseman, MD  methocarbamol (ROBAXIN) 500 MG tablet Take 1 tablet (500 mg total) by mouth 2 (two) times daily. 10/11/16   Lacretia Leigh, MD  mometasone-formoterol (DULERA) 200-5 MCG/ACT AERO Inhale 1 puff into the lungs 2 (two) times daily. 12/07/16   Alyssa A Haney, MD  naphazoline-pheniramine (NAPHCON-A) 0.025-0.3 % ophthalmic solution Place 1 drop into both eyes every 4 (four) hours as needed for irritation. 06/02/16   Konrad Felix, PA  potassium chloride SA (K-DUR,KLOR-CON) 20 MEQ tablet Take 1 tablet (20 mEq total) by mouth 2 (two) times daily. 11/03/16   Truitt Merle, MD  prochlorperazine (COMPAZINE) 10 MG tablet Take 1 tablet (10 mg total) by mouth every 6 (six) hours as needed for nausea or vomiting. 11/17/16   Truitt Merle, MD  Thiamine HCl (VITAMIN B-1) 250 MG tablet Take 1 tablet (250 mg total) by mouth daily. 06/10/16   Veatrice Bourbon, MD  traMADol (ULTRAM) 50 MG tablet Take 1 tablet (50 mg total) by mouth every 6 (six) hours as needed. 12/08/16   Truitt Merle, MD  vitamin  B-12 (CYANOCOBALAMIN) 250 MCG tablet Take 1 tablet (250 mcg total) by mouth daily. Patient taking differently: Take 500 mcg by mouth daily.  01/01/16   Aquilla Hacker, MD    Family History Family History  Problem Relation Age of Onset  . Cancer Father     Prostate  . Heart disease Mother   . Stroke Sister   . Heart disease Sister   . Lupus Sister   . Heart attack Neg Hx   . Colon cancer Neg Hx   . Colon polyps Neg Hx     Social History Social History  Substance Use Topics  . Smoking status:  Current Every Day Smoker    Packs/day: 0.50    Years: 46.00    Types: Cigarettes    Start date: 09/26/1969  . Smokeless tobacco: Never Used     Comment: has cut back about 1/2 pk per day  . Alcohol use 25.2 oz/week    42 Standard drinks or equivalent per week     Comment: 40 oz beer a day      Allergies   Celecoxib and Sulfonamide derivatives   Review of Systems Review of Systems  Constitutional: Positive for fatigue and unexpected weight change.  Respiratory: Positive for cough.   Cardiovascular: Positive for leg swelling.  Gastrointestinal: Positive for abdominal distention and abdominal pain.  Musculoskeletal: Positive for stiffness.  Skin: Negative for wound.  All other systems reviewed and are negative.    Physical Exam Updated Vital Signs BP (!) 150/98   Pulse 98   Temp 97.9 F (36.6 C) (Oral)   Resp 20   SpO2 95%   Physical Exam CONSTITUTIONAL: chronically ill appearing HEAD: Normocephalic/atraumatic EYES: EOMI ENMT: Mucous membranes moist, no significant facial edema NECK: supple no meningeal signs SPINE/BACK:entire spine nontender CV: S1/S2 noted LUNGS: coarse breath sounds bilaterally ABDOMEN: soft, mild diffuse tenderness, mild abdominal distension noted, no rebound or guarding, bowel sounds noted throughout abdomen GU:no cva tenderness, no significant edema to pelvic region (nurse chaperone present) NEURO: Pt is awake/alert/appropriate, moves all  extremitiesx4.  No facial droop.   EXTREMITIES: pulses normal/equal, full ROM, pitting edema to bilateral lower extremities SKIN: warm, color normal PSYCH: no abnormalities of mood noted, alert and oriented to situation   ED Treatments / Results  DIAGNOSTIC STUDIES: Oxygen Saturation is 95% on RA, adequate by my interpretation.    COORDINATION OF CARE: 3:51 AM Discussed treatment plan with pt at bedside and pt agreed to plan. Will order imaging.  Labs (all labs ordered are listed, but only abnormal results are displayed) Labs Reviewed  CBC WITH DIFFERENTIAL/PLATELET - Abnormal; Notable for the following:       Result Value   MCHC 36.1 (*)    RDW 15.9 (*)    Monocytes Absolute 3.8 (*)    All other components within normal limits  PROTIME-INR - Abnormal; Notable for the following:    Prothrombin Time 15.6 (*)    All other components within normal limits  APTT - Abnormal; Notable for the following:    aPTT 39 (*)    All other components within normal limits  COMPREHENSIVE METABOLIC PANEL - Abnormal; Notable for the following:    Sodium 129 (*)    BUN 21 (*)    Calcium 8.3 (*)    Albumin 2.4 (*)    AST 115 (*)    Total Bilirubin 1.7 (*)    Anion gap 4 (*)    All other components within normal limits    EKG  EKG Interpretation None       Radiology Dg Chest 2 View  Result Date: 12/31/2016 CLINICAL DATA:  Bilateral lower extremity swelling. Slight improvement with recent initiation of diuretic. EXAM: CHEST  2 VIEW COMPARISON:  07/28/2016 FINDINGS: Moderate left pleural effusion. Mild atelectasis or consolidation adjacent to the pleural effusion. The right lung is clear. Pulmonary vasculature is normal. IMPRESSION: New left pleural effusion with adjacent left lung base atelectasis or consolidation. Electronically Signed   By: Andreas Newport M.D.   On: 01/12/2017 04:21    Procedures Procedures (including critical care time)  Medications Ordered in ED Medications  ipratropium-albuterol (DUONEB) 0.5-2.5 (3) MG/3ML nebulizer solution 3 mL (3 mLs Nebulization Given 01/04/2017 0430)     Initial Impression / Assessment and Plan / ED Course  I have reviewed the triage vital signs and the nursing notes.  Pertinent  imaging results that were available during my care of the patient were reviewed by me and considered in my medical decision making (see chart for details).     4:07 AM Pt with h/o liver CA and polysubstance abuse She is currently on chemo for her liver CA She reports increased edema No signs of SVC syndrome She has edema to LE abdomen, but no peritoneal signs to suggest SBP She has been seen for this as an outpatient and had labs on 3/29 Due to cough/coarse BS will give nebs and check CXR Pt up/ambulatory, no distress  Per oncology, symptoms likely due to liver disease as well as her meds 5:06 AM Pt with new/moderate LEFT pleural effusion She reports increased dysnpea Will admit to family medicine for monitoring/thoracentesis  5:34 AM D/w family medicine resident, for admssion to [facility] for new pleural effusion with dyspnea  Final Clinical Impressions(s) / ED Diagnoses   Final diagnoses:  Pleural effusion on left    New Prescriptions New Prescriptions   No medications on file   I personally performed the services described in this documentation, which was scribed in my presence. The recorded information has been reviewed and is accurate.        Ripley Fraise, MD 01/10/2017 [phone]

## 2016-12-26 NOTE — Progress Notes (Addendum)
Received from [facility] alert and on monitor.  O2 on and pt able to ambulate a short distance to the bathroom without any discomfort.. PA in to see Pt. Pt to procedure area via stretcher. Dr Irish Lack in to see pt and talk to her about the procedure.

## 2016-12-26 NOTE — ED Provider Notes (Signed)
Due to lack of beds at [facility], will admit to [place] D/w dr Clementeen Graham for admission    Ripley Fraise, MD 01/08/2017 [phone]

## 2016-12-26 NOTE — ED Notes (Signed)
Bed: WA09 Expected date:  Expected time:  Means of arrival:  Comments: Cancer pt, leg swelling

## 2016-12-26 NOTE — ED Triage Notes (Signed)
Brought in by EMS from home with c/o bilateral lower leg swelling.  Pt reports that ever since she got her 2 rounds of chemotherapy for her liver cancer, both her lower legs have been "swelling".  She was started on Lasix last Thursday; pt reports that swelling has subsided some but "legs still feel tight".

## 2016-12-26 NOTE — Consult Note (Signed)
Cardiology Consult    Patient ID: Lisa [name] MRN: [id number], DOB/AGE: Nov 01, 1955   Admit date: 01/09/2017 Date of Consult: 12/28/2016  Primary Physician: Marina Goodell, MD Primary Cardiologist: Dr. Irish Lack Requesting Provider: Dr. Clementeen Graham  Reason for Consult: pericardial effusion  Patient Profile    Lisa [name] is a 61 yo female with a  PMH significant for HTN, tobacco use, mitral valve prolapse, liver cancer, and hepatitis C.   Lisa [name] is a 61 y.o. female who is being seen today for the evaluation of pericardial effusion at the request of Dr. Clementeen Graham.   Past Medical History   Past Medical History:  Diagnosis Date  . Alcohol abuse, daily use   . Cirrhosis of liver ([place])   . Helicobacter pylori gastritis   . Hepatitis C   . Hepatocellular carcinoma ([place]) dx'd 08/2016  . HTN (hypertension)   . Hx of cardiovascular stress test    Lexiscan Myoview 3/16: No ischemia, EF 66%, normal study  . Leg pain    ABIs 3/16:  normal   . Mitral valve prolapse   . Osteopetrosis   . Poor dental hygiene   . Tobacco use     Past Surgical History:  Procedure Laterality Date  . ABDOMINAL HYSTERECTOMY    . CARPAL TUNNEL RELEASE    . IR GENERIC HISTORICAL  09/28/2016   IR RADIOLOGIST EVAL & MGMT 09/28/2016 Sandi Mariscal, MD [facility] INTERV RAD  . LAPAROSCOPY    . rod in right arm       Allergies  Allergies  Allergen Reactions  . Celecoxib Hives  . Sulfonamide Derivatives Hives, Rash and Other (See Comments)    Sulfamethoxazole     History of Present Illness    Lisa [name] was last seen in clinic on 08/30/16 with Dr. Irish Lack and was in her usual state of health at that time. She had a remote history of chest pain in the ED 08/12/16. At that time, she tested positive for cocaine; enzymes and BNP were negative.   She presented to [facility] ED with worsening leg swelling, fatigue, abdominal and breast swelling for two weeks. She was found to have a left-sided pleural effusion and  underwent thoracentesis. She was also found to have an enlarging pericardial effusion. She was transferred to [facility] Cath lab for pericardiocentesis. She reports productive cough and dyspnea; she denies recent illness, nausea, vomiting, dizziness, palpitations, and chest pain. She appear in NAD prior to pericardiocentesis.    Inpatient Medications    . aspirin EC  81 mg Oral Daily  . enoxaparin (LOVENOX) injection  40 mg Subcutaneous Q24H  . furosemide  40 mg Intravenous Q12H  . lisinopril  20 mg Oral Daily  . mometasone-formoterol  1 puff Inhalation BID  . potassium chloride SA  20 mEq Oral BID  . vitamin B-1  250 mg Oral Daily  . vitamin B-12  250 mcg Oral Daily     Outpatient Medications    Prior to Admission medications   Medication Sig Start Date End Date Taking? Authorizing Provider  aspirin 81 MG tablet Take 81 mg by mouth daily.   Yes Historical Provider, MD  hydrochlorothiazide (HYDRODIURIL) 25 MG tablet Take 1 tablet (25 mg total) by mouth daily. 08/30/16  Yes Jettie Booze, MD  hydrOXYzine (ATARAX/VISTARIL) 10 MG tablet Take 1 tablet (10 mg total) by mouth 3 (three) times daily as needed for anxiety (or sleep). 12/07/16  Yes Alyssa A Haney, MD  lisinopril (PRINIVIL,ZESTRIL) 20 MG tablet Take 1  tablet (20 mg total) by mouth daily. 11/25/16  Yes Smiley Houseman, MD  mometasone-formoterol (DULERA) 200-5 MCG/ACT AERO Inhale 1 puff into the lungs 2 (two) times daily. 12/07/16  Yes Alyssa A Haney, MD  Potassium 99 MG TABS Take 99 mg by mouth daily.   Yes Historical Provider, MD  prochlorperazine (COMPAZINE) 10 MG tablet Take 1 tablet (10 mg total) by mouth every 6 (six) hours as needed for nausea or vomiting. 11/17/16  Yes Truitt Merle, MD  Thiamine HCl (VITAMIN B-1) 250 MG tablet Take 1 tablet (250 mg total) by mouth daily. 06/10/16  Yes Alyssa A Lincoln Brigham, MD  traMADol (ULTRAM) 50 MG tablet Take 1 tablet (50 mg total) by mouth every 6 (six) hours as needed. 12/08/16  Yes Truitt Merle, MD    vitamin B-12 (CYANOCOBALAMIN) 250 MCG tablet Take 1 tablet (250 mcg total) by mouth daily. Patient taking differently: Take 500 mcg by mouth daily.  01/01/16  Yes York Ram Melancon, MD  amLODipine (NORVASC) 10 MG tablet Take 1 tablet (10 mg total) by mouth daily. Patient not taking: Reported on 12/22/2016 08/30/16   Jettie Booze, MD  docusate sodium (COLACE) 100 MG capsule Take 1 capsule (100 mg total) by mouth 2 (two) times daily. Patient not taking: Reported on 01/21/2017 11/25/16   Smiley Houseman, MD  ferrous sulfate 325 (65 FE) MG tablet Take 1 tablet (325 mg total) by mouth daily with breakfast. Patient not taking: Reported on 01/19/2017 01/09/15   Leone Haven, MD  folic acid (FOLVITE) 1 MG tablet Take 1 tablet (1 mg total) by mouth daily. Patient not taking: Reported on 12/25/2016 08/03/16   Veatrice Bourbon, MD  furosemide (LASIX) 20 MG tablet Take 1 tablet (20 mg total) by mouth daily. Patient not taking: Reported on 01/15/2017 12/22/16   Truitt Merle, MD  methocarbamol (ROBAXIN) 500 MG tablet Take 1 tablet (500 mg total) by mouth 2 (two) times daily. Patient not taking: Reported on 01/12/2017 10/11/16   Lacretia Leigh, MD  naphazoline-pheniramine (NAPHCON-A) 0.025-0.3 % ophthalmic solution Place 1 drop into both eyes every 4 (four) hours as needed for irritation. Patient not taking: Reported on 01/06/2017 06/02/16   Konrad Felix, PA  potassium chloride SA (K-DUR,KLOR-CON) 20 MEQ tablet Take 1 tablet (20 mEq total) by mouth 2 (two) times daily. Patient not taking: Reported on 01/08/2017 11/03/16   Truitt Merle, MD     Family History     Family History  Problem Relation Age of Onset  . Cancer Father     Prostate  . Heart disease Mother   . Stroke Sister   . Heart disease Sister   . Lupus Sister   . Heart attack Neg Hx   . Colon cancer Neg Hx   . Colon polyps Neg Hx     Social History    Social History   Social History  . Marital status: Divorced    Spouse name: N/A  . Number of  children: 1  . Years of education: N/A   Occupational History  . personal care asst [employer] History Main Topics  . Smoking status: Current Every Day Smoker    Packs/day: 0.50    Years: 46.00    Types: Cigarettes    Start date: 09/26/1969  . Smokeless tobacco: Never Used     Comment: has cut back about 1/2 pk per day  . Alcohol use 25.2 oz/week    42 Standard  drinks or equivalent per week     Comment: 40 oz beer a day   . Drug use: Yes    Types: Marijuana, Cocaine     Comment: Still using marijuana.  Past history of IV drug use  . Sexual activity: Not Currently   Other Topics Concern  . Not on file   Social History Narrative   Lives alone in [place] in an apartment. 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 No current source of income. Goes to [employer] for help as needed.   One daughter, age 72. Six grandchildren, one great-grand child.     Rides bus for transportation     Review of Systems    General:  No chills, fever, night sweats or weight changes. Positive for fatigue and overall feeling of "unwell" Cardiovascular:  No chest pain, dyspnea on exertion, Positive for edema and orthopnea, no palpitations, paroxysmal nocturnal dyspnea. Dermatological: No rash, lesions/masses Respiratory: Positive for cough, dyspnea Urologic: No hematuria, dysuria Abdominal:   No nausea, vomiting, diarrhea, bright red blood per rectum, melena, or hematemesis Neurologic:  No visual changes, changes in mental status. All other systems reviewed and are otherwise negative except as noted above.  Physical Exam    Blood pressure 131/70, pulse 80, temperature 98.3 F (36.8 C), temperature source Oral, resp. rate 18, height 5\' 3"  (1.6 m), weight 136 lb 3.9 oz (61.8 kg), SpO2 96 %.  General: Pleasant, NAD Psych: Normal affect. Neuro: Alert and oriented X 3. Moves all extremities spontaneously. HEENT: Normal  Neck: Supple without  bruits, + JVD to jaw. Lungs:  Resp regular and unlabored, diminished in bases. Heart: RRR no murmurs. Abdomen: Soft, tender to palpation, non-distended, BS + x 4.  Extremities: No clubbing, cyanosis, 1+ edema. DP/PT/Radials 1+ and equal bilaterally.  Labs    Troponin (Point of Care Test) No results for input(s): TROPIPOC in the last 72 hours. No results for input(s): CKTOTAL, CKMB, TROPONINI in the last 72 hours. Lab Results  Component Value Date   WBC 10.5 12/31/2016   HGB 14.2 01/05/2017   HCT 39.3 01/18/2017   MCV 85.4 01/23/2017   PLT 103 (L) 01/16/2017    Recent Labs Lab 12/28/2016 0445  NA 129*  K 4.9  CL 101  CO2 24  BUN 21*  CREATININE 0.85  CALCIUM 8.3*  PROT 7.6  BILITOT 1.7*  ALKPHOS 117  ALT 38  AST 115*  GLUCOSE 77   Lab Results  Component Value Date   CHOL 152 04/24/2014   HDL 46 04/24/2014   LDLCALC 88 04/24/2014   TRIG 88 04/24/2014   No results found for: [facility]   Radiology Studies    Dg Chest 1 View  Result Date: 01/02/2017 CLINICAL DATA:  Status post left thoracentesis. EXAM: CHEST 1 VIEW COMPARISON:  Radiographs of same day. FINDINGS: Stable cardiomegaly. Atherosclerosis of thoracic aorta is noted. No pneumothorax is seen status post left thoracentesis. Left pleural effusion is significantly smaller. Left basilar atelectasis or infiltrate is noted. Mild right basilar atelectasis is noted as well. IMPRESSION: No pneumothorax status post left-sided thoracentesis. Left pleural effusion is significantly smaller. Electronically Signed   By: Marijo Conception, M.D.   On: 01/22/2017 15:53   Dg Chest 2 View  Result Date: 01/10/2017 CLINICAL DATA:  Bilateral lower extremity swelling. Slight improvement with recent initiation of diuretic. EXAM: CHEST  2 VIEW COMPARISON:  07/28/2016 FINDINGS: Moderate left pleural effusion. Mild atelectasis or consolidation adjacent to the  pleural effusion. The right lung is clear. Pulmonary vasculature is normal. IMPRESSION:  New left pleural effusion with adjacent left lung base atelectasis or consolidation. Electronically Signed   By: Andreas Newport M.D.   On: 12/25/2016 04:21    ECG & Cardiac Imaging    Echocardiogram 01/05/2017: Study Conclusions - Left ventricle: The cavity size was normal. Wall thickness was   increased in a pattern of severe LVH. Systolic function was   vigorous. The estimated ejection fraction was in the range of 65%   to 70%. Wall motion was normal; there were no regional wall   motion abnormalities. Doppler parameters are consistent with   abnormal left ventricular relaxation (grade 1 diastolic   dysfunction). The E/e&' ratio is >15, suggesting elevated LV   filling pressure. - Aortic valve: Trileaflet. Mobile strands at the leaflet tips -   could be Lambl&'s excrescences (normal variant) or less likely   thrombus/vegetation. - Mitral valve: Calcified annulus. Mildly thickened leaflets . Mild   regurgitation. There is >25% mitral inflow variation with   respiration. - Left atrium: Severely dilated. - Right ventricle: The cavity size was normal. There is thickening   of the RV free wall with a mass noted at the RV/RA junction -   this could represent thrombus or malignancy. Systolic function   was normal. Diastolic collapse is noted. - Right atrium: Normal size. Diastolic collapse of the free wall is   noted. - Tricuspid valve: There was trivial regurgitation. Greater than   35% tricuspid inflow variation with respiration. - Pulmonary arteries: Dilated. - Systemic veins: The IVC measures <2.1 cm, but does not collapse   >50%, suggesting an elevated RA pressure of 8 mmHg. - Pericardium, extracardiac: Moderate to large circumferential   pericardial effusion. There was a right pleural effusion. There   was a left pleural effusion. Ascites is noted.  Impressions: - Compared to a prior study in 07/2016, the pericardial effusion is   larger - now moderate to severe with  associated tamponade   features (dilated IVC, RV and RA free wall collapse, mitral and   tricuspid inflow variation), bilateral pleural effusions and   ascites are noted. There is thickening of the RV free wall and a   gelatinous mass noted at the RA/RV junction measuring about 1.5   cm in diameter. Clinical correlation with tamponade physiology is   highly recommended.  Recommendations:  This procedure has been discussed with Dr. Clementeen Graham at 2:56 pm on 12/30/2016, given the echocardiographic features of tamponade.   Myoview 12/12/14: Normal stress nuclear study. LV Ejection Fraction: 66%.  LV Wall Motion:  NL LV Function; NL Wall Motion    Assessment & Plan    1. Pericardial effusion - patient is [facility] for pericardiocentesis - currently, hemodynamically stable and able to participate in interview - continue to monitor on telemetry - plan for pericardiocentesis this evening - INR 1.23; Hb 14.2, Platelets 103  2. Left-sided pleural effusion - thoracentesis today with 800 cc of clear, yellow fluid - repeat CXR with improved lung volume, improved left pleural effusion - pt states SOB is improved with supplemental O2 in place after procedure  3. Hepatocellular carcinoma stage II - per primary team, continue nivolumab per oncology   4. Ascites - primary team started lasix - will consider paracentesis if symptoms do not improve  5. Polysubstance abuse - pt states she is compliant on her medications - UDS was positive for cocaine today   Signed,  Tami Lin Duke, PA-C 01/23/2017, 5:08 PM  I have examined the patient and reviewed assessment and plan and discussed with patient.  Agree with above as stated.  I personally reviewed the echo images.  She is stable now, but clearly has a significant effusion with echo features worrisome for tamponade.  In addition, she has significant ascites as well.  THis makes finding anatomic landmarks more difficult.  Will  attempt subxiphoid pericardiocentesis.  Cirrotic liver noted on CT scan. Send fluid for analysis.  I explained the procedure to the patient and all questions were answered.   Larae Grooms

## 2016-12-27 ENCOUNTER — Observation Stay (HOSPITAL_COMMUNITY): Payer: Medicaid Other

## 2016-12-27 ENCOUNTER — Encounter (HOSPITAL_COMMUNITY): Payer: Self-pay | Admitting: Interventional Cardiology

## 2016-12-27 DIAGNOSIS — F1010 Alcohol abuse, uncomplicated: Secondary | ICD-10-CM | POA: Diagnosis present

## 2016-12-27 DIAGNOSIS — G8929 Other chronic pain: Secondary | ICD-10-CM | POA: Diagnosis present

## 2016-12-27 DIAGNOSIS — R109 Unspecified abdominal pain: Secondary | ICD-10-CM | POA: Diagnosis not present

## 2016-12-27 DIAGNOSIS — E875 Hyperkalemia: Secondary | ICD-10-CM | POA: Diagnosis not present

## 2016-12-27 DIAGNOSIS — I319 Disease of pericardium, unspecified: Secondary | ICD-10-CM | POA: Diagnosis not present

## 2016-12-27 DIAGNOSIS — R600 Localized edema: Secondary | ICD-10-CM | POA: Diagnosis not present

## 2016-12-27 DIAGNOSIS — E873 Alkalosis: Secondary | ICD-10-CM | POA: Diagnosis not present

## 2016-12-27 DIAGNOSIS — E871 Hypo-osmolality and hyponatremia: Secondary | ICD-10-CM | POA: Diagnosis present

## 2016-12-27 DIAGNOSIS — I313 Pericardial effusion (noninflammatory): Secondary | ICD-10-CM | POA: Diagnosis present

## 2016-12-27 DIAGNOSIS — B1920 Unspecified viral hepatitis C without hepatic coma: Secondary | ICD-10-CM | POA: Diagnosis present

## 2016-12-27 DIAGNOSIS — F1910 Other psychoactive substance abuse, uncomplicated: Secondary | ICD-10-CM | POA: Diagnosis not present

## 2016-12-27 DIAGNOSIS — I314 Cardiac tamponade: Secondary | ICD-10-CM | POA: Diagnosis present

## 2016-12-27 DIAGNOSIS — Z720 Tobacco use: Secondary | ICD-10-CM | POA: Diagnosis not present

## 2016-12-27 DIAGNOSIS — Z7951 Long term (current) use of inhaled steroids: Secondary | ICD-10-CM | POA: Diagnosis not present

## 2016-12-27 DIAGNOSIS — K7460 Unspecified cirrhosis of liver: Secondary | ICD-10-CM | POA: Diagnosis not present

## 2016-12-27 DIAGNOSIS — Z832 Family history of diseases of the blood and blood-forming organs and certain disorders involving the immune mechanism: Secondary | ICD-10-CM | POA: Diagnosis not present

## 2016-12-27 DIAGNOSIS — K7290 Hepatic failure, unspecified without coma: Secondary | ICD-10-CM | POA: Diagnosis not present

## 2016-12-27 DIAGNOSIS — C220 Liver cell carcinoma: Secondary | ICD-10-CM | POA: Diagnosis present

## 2016-12-27 DIAGNOSIS — Z79899 Other long term (current) drug therapy: Secondary | ICD-10-CM | POA: Diagnosis not present

## 2016-12-27 DIAGNOSIS — D6959 Other secondary thrombocytopenia: Secondary | ICD-10-CM | POA: Diagnosis present

## 2016-12-27 DIAGNOSIS — I471 Supraventricular tachycardia: Secondary | ICD-10-CM | POA: Diagnosis not present

## 2016-12-27 DIAGNOSIS — Z9889 Other specified postprocedural states: Secondary | ICD-10-CM | POA: Diagnosis not present

## 2016-12-27 DIAGNOSIS — R601 Generalized edema: Secondary | ICD-10-CM | POA: Diagnosis not present

## 2016-12-27 DIAGNOSIS — Z8249 Family history of ischemic heart disease and other diseases of the circulatory system: Secondary | ICD-10-CM | POA: Diagnosis not present

## 2016-12-27 DIAGNOSIS — R188 Other ascites: Secondary | ICD-10-CM | POA: Diagnosis present

## 2016-12-27 DIAGNOSIS — I81 Portal vein thrombosis: Secondary | ICD-10-CM | POA: Diagnosis present

## 2016-12-27 DIAGNOSIS — J90 Pleural effusion, not elsewhere classified: Secondary | ICD-10-CM | POA: Diagnosis present

## 2016-12-27 DIAGNOSIS — I10 Essential (primary) hypertension: Secondary | ICD-10-CM | POA: Diagnosis present

## 2016-12-27 DIAGNOSIS — F17210 Nicotine dependence, cigarettes, uncomplicated: Secondary | ICD-10-CM | POA: Diagnosis present

## 2016-12-27 DIAGNOSIS — M549 Dorsalgia, unspecified: Secondary | ICD-10-CM | POA: Diagnosis present

## 2016-12-27 DIAGNOSIS — F1410 Cocaine abuse, uncomplicated: Secondary | ICD-10-CM | POA: Diagnosis present

## 2016-12-27 DIAGNOSIS — Z9221 Personal history of antineoplastic chemotherapy: Secondary | ICD-10-CM | POA: Diagnosis not present

## 2016-12-27 DIAGNOSIS — R0600 Dyspnea, unspecified: Secondary | ICD-10-CM | POA: Diagnosis not present

## 2016-12-27 DIAGNOSIS — Z823 Family history of stroke: Secondary | ICD-10-CM | POA: Diagnosis not present

## 2016-12-27 HISTORY — PX: IR PARACENTESIS: IMG2679

## 2016-12-27 LAB — MRSA PCR SCREENING: MRSA BY PCR: NEGATIVE

## 2016-12-27 LAB — COMPREHENSIVE METABOLIC PANEL
ALBUMIN: 2.4 g/dL — AB (ref 3.5–5.0)
ALK PHOS: 103 U/L (ref 38–126)
ALT: 37 U/L (ref 14–54)
AST: 109 U/L — AB (ref 15–41)
Anion gap: 6 (ref 5–15)
BUN: 19 mg/dL (ref 6–20)
CALCIUM: 8 mg/dL — AB (ref 8.9–10.3)
CHLORIDE: 100 mmol/L — AB (ref 101–111)
CO2: 22 mmol/L (ref 22–32)
CREATININE: 0.94 mg/dL (ref 0.44–1.00)
GFR calc non Af Amer: >60 mL/min (ref >60)
GLUCOSE: 119 mg/dL — AB (ref 65–99)
Potassium: 5.5 mmol/L — ABNORMAL HIGH (ref 3.5–5.1)
SODIUM: 128 mmol/L — AB (ref 135–145)
Total Bilirubin: 1.7 mg/dL — ABNORMAL HIGH (ref 0.3–1.2)
Total Protein: 7.7 g/dL (ref 6.5–8.1)

## 2016-12-27 LAB — GRAM STAIN

## 2016-12-27 LAB — ALBUMIN, PLEURAL OR PERITONEAL FLUID: Albumin, Fluid: <1 g/dL

## 2016-12-27 LAB — AMYLASE, PLEURAL OR PERITONEAL FLUID: AMYLASE FL: 122 U/L

## 2016-12-27 LAB — HIV ANTIBODY (ROUTINE TESTING W REFLEX): HIV Screen 4th Generation wRfx: NONREACTIVE

## 2016-12-27 LAB — LACTATE DEHYDROGENASE, PLEURAL OR PERITONEAL FLUID: LD, Fluid: 79 U/L — ABNORMAL HIGH (ref 3–23)

## 2016-12-27 LAB — GLUCOSE, PLEURAL OR PERITONEAL FLUID: GLUCOSE FL: 151 mg/dL

## 2016-12-27 MED ORDER — FUROSEMIDE 10 MG/ML IJ SOLN
20.0000 mg | Freq: Every day | INTRAMUSCULAR | Status: DC
  Administered 2016-12-27: 20 mg via INTRAVENOUS
  Filled 2016-12-27: qty 2

## 2016-12-27 MED ORDER — LIDOCAINE HCL 1 % IJ SOLN
INTRAMUSCULAR | Status: AC
  Filled 2016-12-27: qty 20

## 2016-12-27 MED ORDER — VITAMIN B-12 1000 MCG PO TABS
1000.0000 ug | ORAL_TABLET | Freq: Every day | ORAL | Status: DC
  Administered 2016-12-27 – 2016-12-31 (×5): 1000 ug via ORAL
  Filled 2016-12-27 (×5): qty 1

## 2016-12-27 NOTE — Plan of Care (Signed)
Patient sent to CT via bed with tech for procedure.

## 2016-12-27 NOTE — Plan of Care (Signed)
Fluid sent to lab 12/25/2016 confirmed with Dr. Tamala Julian as pericardial fluid (not pleural fluid), also pt may go to ultrasound without ICU RN

## 2016-12-27 NOTE — Progress Notes (Signed)
Progress Note  Patient Name: Lisa [name] Date of Encounter: 12/27/2016  Primary Cardiologist: [place] much better today. Throat pain.  Inpatient Medications    Scheduled Meds: . aspirin EC  81 mg Oral Daily  . mometasone-formoterol  1 puff Inhalation BID  . sodium chloride flush  3 mL Intravenous Q12H  . vitamin B-1  250 mg Oral Daily  . vitamin B-12  1,000 mcg Oral Daily   Continuous Infusions:  PRN Meds: sodium chloride, acetaminophen, albuterol, bisacodyl, hydrOXYzine, morphine injection, ondansetron **OR** ondansetron (ZOFRAN) IV, ondansetron (ZOFRAN) IV, sodium chloride flush, traMADol   Vital Signs    Vitals:   12/27/16 0700 12/27/16 0800 12/27/16 0900 12/27/16 1000  BP: (!) 104/57 (!) 123/100 136/71 (!) 118/55  Pulse: 89 (!) 102 99 92  Resp: 11 (!) 26 (!) 24 13  Temp: 97.6 F (36.4 C)     TempSrc: Oral     SpO2: 98% 98% 97% 97%  Weight:      Height:        Intake/Output Summary (Last 24 hours) at 12/27/16 1049 Last data filed at 12/27/16 1000  Gross per 24 hour  Intake           634.67 ml  Output             1180 ml  Net          -545.33 ml   Filed Weights   12/27/2016 0423 01/09/2017 0835 12/30/2016 1950  Weight: (S) 135 lb 2 oz (61.3 kg) 136 lb 3.9 oz (61.8 kg) 136 lb 11 oz (62 kg)    Telemetry    Sinus tach, ~ 105 bpm - Personally Reviewed  ECG    No new data - Personally Reviewed  Physical Exam  AAF, no distress GEN: No acute distress.   Neck: No visible JVD lying at 30 degrees. Cardiac: RRR, no murmurs, rubs, or gallops.  Respiratory: Clear to auscultation bilaterally. GI: Soft, nontender, non-distended  MS: No edema; No deformity. Neuro:  Nonfocal  Psych: Normal affect   Labs    Chemistry Recent Labs Lab 12/22/16 1205 12/31/2016 0445 12/27/2016 2005 12/27/16 0242  NA 128* 129*  --  128*  K 4.6 4.9  --  5.5*  CL  --  101  --  100*  CO2 22 24  --  22  GLUCOSE 105 77 97 119*  BUN 13.2 21*  --  19    CREATININE 0.8 0.85 0.88 0.94  CALCIUM 8.3* 8.3*  --  8.0*  PROT 7.4 7.6  --  7.7  ALBUMIN 2.3* 2.4*  --  2.4*  AST 79* 115*  --  109*  ALT 26 38  --  37  ALKPHOS 108 117  --  103  BILITOT 1.55* 1.7*  --  1.7*  GFRNONAA  --  >60 >60 >60  GFRAA  --  >60 >60 >60  ANIONGAP 4 4*  --  6     Hematology Recent Labs Lab 12/22/16 1205 01/06/2017 0445 12/31/2016 2005  WBC 7.6 10.5 9.7  RBC 4.84 4.60 4.49  HGB 15.2 14.2 13.8  HCT 42.8 39.3 38.5  MCV 88.4 85.4 85.7  MCH 31.4 30.9 30.7  MCHC 35.5 36.1* 35.8  RDW 16.0* 15.9* 15.8*  PLT 108* 103* 108*    Cardiac EnzymesNo results for input(s): TROPONINI in the last 168 hours. No results for input(s): TROPIPOC in the last 168 hours.   BNPNo results for input(s): BNP, PROBNP  in the last 168 hours.   DDimer No results for input(s): DDIMER in the last 168 hours.   Radiology    Dg Chest 1 View  Result Date: 12/25/2016 CLINICAL DATA:  Status post left thoracentesis. EXAM: CHEST 1 VIEW COMPARISON:  Radiographs of same day. FINDINGS: Stable cardiomegaly. Atherosclerosis of thoracic aorta is noted. No pneumothorax is seen status post left thoracentesis. Left pleural effusion is significantly smaller. Left basilar atelectasis or infiltrate is noted. Mild right basilar atelectasis is noted as well. IMPRESSION: No pneumothorax status post left-sided thoracentesis. Left pleural effusion is significantly smaller. Electronically Signed   By: Marijo Conception, M.D.   On: 12/25/2016 15:53   Dg Chest 2 View  Result Date: 01/03/2017 CLINICAL DATA:  Bilateral lower extremity swelling. Slight improvement with recent initiation of diuretic. EXAM: CHEST  2 VIEW COMPARISON:  07/28/2016 FINDINGS: Moderate left pleural effusion. Mild atelectasis or consolidation adjacent to the pleural effusion. The right lung is clear. Pulmonary vasculature is normal. IMPRESSION: New left pleural effusion with adjacent left lung base atelectasis or consolidation. Electronically  Signed   By: Andreas Newport M.D.   On: 12/30/2016 04:21   US Thoracentesis Asp Pleural Space W/img Guide  Result Date: 01/12/2017 INDICATION: Patient with history of hepatocellular carcinoma and cirrhosis presents with left pleural effusion. Request is made for diagnostic and therapeutic left thoracentesis. EXAM: ULTRASOUND GUIDED DIAGNOSTIC AND THERAPEUTIC LEFT THORACENTESIS MEDICATIONS: 10 mL 1% lidocaine COMPLICATIONS: None immediate. PROCEDURE: An ultrasound guided thoracentesis was thoroughly discussed with the patient and questions answered. The benefits, risks, alternatives and complications were also discussed. The patient understands and wishes to proceed with the procedure. Written consent was obtained. Ultrasound was performed to localize and mark an adequate pocket of fluid in the left chest. The area was then prepped and draped in the normal sterile fashion. 1% Lidocaine was used for local anesthesia. Under ultrasound guidance a Safe-T-centesis catheter was introduced. Thoracentesis was performed. The catheter was removed and a dressing applied. FINDINGS: A total of approximately 800 mL of clear, yellow fluid was removed. Samples were sent to the laboratory as requested by the clinical team. IMPRESSION: Successful ultrasound guided diagnostic and therapeutic left thoracentesis yielding 800 mL of pleural fluid. Read by:  Brynda Greathouse PA-C Electronically Signed   By: Markus Daft M.D.   On: 12/30/2016 15:59    Cardiac Studies   No new data  Patient Profile     61 y.o. female  Female with hepatocellular cancer, metastatic with presentation including pleural effusion, pericardial effusion and ascites. Now s/p pericardiocentesis and pleural tap. Feels better.  Assessment & Plan    1. Large pericardial effusion treated with pericardiocentesis with improvement in dyspnea. Still draing straw colored fluid. Plan repeat echo in AM and pull drain if no significant effusion remains.  Overall  plan is per primary care team. If pericardial effusion recurs after pull, will need sub-xyphoid window by cardiac surgeon.  Signed, Sinclair Grooms, MD  12/27/2016, 10:49 AM

## 2016-12-27 NOTE — Procedures (Signed)
PROCEDURE SUMMARY:  Successful US guided paracentesis from right lateral abdomen.  Yielded 450 mL of clear yellow fluid.  No immediate complications.  Pt tolerated well.   Specimen was sent for labs.  Kevionna Heffler S Braeton Wolgamott PA-C 12/27/2016 2:07 PM

## 2016-12-27 NOTE — Plan of Care (Signed)
Patient back from x-ray 

## 2016-12-27 NOTE — Discharge Summary (Signed)
[facility] Discharge Summary  Patient name: Lisa [name] Medical record number: [id number] Date of birth: Sep 18, 1956 Age: 61 y.o. Gender: female Date of Admission: 01/09/2017  Date of Discharge: 2017-01-20 (deceased) Admitting Physician: No admitting provider for patient encounter.  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 Also found to have pericardial effusion concerning for cardiac tamponade on, transferred to [facility] for pericardiocentesis. Past medical history significant for hepatocellular carcinoma, portal vein thrombosis, hepatitis C, polysubstance abuse, chronic back pain. Patient admitted to [facility] and was transferred to [facility] on April 2 for further care.  Patient initially admitted to [facility] with complaints of progressive fatigue and diffuse swelling of lower extremities, abdomen, and breast for 2 weeks. She was given a trial of nebulizer given dyspnea. Pertinent labs include WBC of 10.5, normal hemoglobin, platelets 103, sodium 129, BUN of 21, and creatinine 0.85. INR 1.23. CXR consistent with moderate left-sided pleural effusion with atelectasis first consolidation. Patient subsequently underwent echocardiogram given lower blood  pressure and continued dyspnea showing pericardial effusion requiring thoracocentesis on 4/2 requiring drain. Patient was stable for transfer to [facility] for further management.  Upon arrival to [facility], patient PS remained stable. Pleural effusion was drained by pleurocentesis on 4/2 followed by paracentesis on 4/3 given abdominal distention and pain. Patient's symptoms improved after removal of effusions. Labs revealed all effusions were transudate fluid. Pericardial drain removed on 4/5. Blood pressures improved slightly however left pleural effusion returned requiring diuresis. Patient subsequently became obtunded secondary to suspected hepatic encephalopathy. Patient was given lactulose but mentation did not improve. Critical care consulted given tachycardia and 160s and agonal respiratory pattern. Given DO NOT RESUSCITATE status, comfort measures were taken and patient passed 2017/01/20 with family at bedside.  Significant Procedures:  Pericardiocentesis with drain placement: Performed 4/2, drain removed 4/5 Pleurocentesis: Performed 4/2 Paracentesis: Performed 4/3  Significant Labs and Imaging:   Recent Labs Lab 12/22/16 1205 12/31/2016 0445 12/25/2016 2005  WBC 7.6 10.5 9.7  HGB 15.2 14.2 13.8  HCT 42.8 39.3 38.5  PLT 108* 103* 108*    Recent Labs Lab 12/22/16 1205 01/15/2017 0445 12/30/2016 2005 12/27/16 0242  NA 128* 129*  --  128*  K 4.6 4.9  --  5.5*  CL  --  101  --  100*  CO2 22 24  --  22  GLUCOSE 105 77 97 119*  BUN 13.2 21*  --  19  CREATININE 0.8 0.85 0.88 0.94  CALCIUM 8.3* 8.3*  --  8.0*  ALKPHOS 108 117  --  103  AST 79* 115*  --  109*  ALT 26 38  --  37  ALBUMIN 2.3* 2.4*  --  2.4*   UDS: +Cocaine HIV: Non-reactive Lactate dehydrogenase: 305 > 91 (H) INR: 1.23  TSH: 3.462 (WNL)  Imaging/Diagnostic Tests: DG Chest 2 View (12/30/2016) FINDINGS: Low lung volumes. Stable cardiomediastinal silhouette with normal heart size and aortic atherosclerosis. No  pneumothorax.  Stable small right and small to moderate left pleural effusions. No pulmonary edema. Patchy consolidation at the left greater than right lung bases, stable.  IMPRESSION: 1. Stable small moderate left and small right pleural effusions. 2. Stable patchy left greater than right bibasilar lung consolidation, worrisome for multilobar pneumonia or aspiration. 3. Aortic atherosclerosis.  Echocardiogram (12/28/2016) IMPRESSION: Mild inferolateral pericardial effusion, no evidence of tamponade. Large left pleural effusion.  Pericardiocentesis (01/03/2017) Successful pericardiocentesis with 600 mL of fluid removed. Monitor output of the drain. Hopefully, this can be removed soon. She does have significant ascites which may need to be managed with paracentesis.  US THORACENTESIS ASP PLEURAL SPACE W/IMG GUIDE (01/10/2017) FINDINGS: A total of approximately 800 mL of clear, yellow fluid was removed. Samples were sent to the laboratory as requested by the clinical team.  IMPRESSION: Successful ultrasound guided diagnostic and therapeutic left thoracentesis yielding 800 mL of pleural fluid.  DG Chest 2 View (01/19/2017) FINDINGS: Moderate left pleural effusion. Mild atelectasis or consolidation adjacent to the pleural effusion. The right lung is clear. Pulmonary vasculature is normal.  IMPRESSION: New left pleural effusion with adjacent left lung base atelectasis or consolidation.  DG Chest 1 View (12/25/2016) FINDINGS: Stable cardiomegaly. Atherosclerosis of thoracic aorta is noted. No pneumothorax is seen status post left thoracentesis. Left pleural effusion is significantly smaller. Left basilar atelectasis or infiltrate is noted. Mild right basilar atelectasis is noted as well.  IMPRESSION: No pneumothorax status post left-sided thoracentesis. Left pleural effusion is significantly smaller.  Echocardiogram (01/12/2017) Study Conclusions - Left ventricle: The cavity  size was normal. Wall thickness wasincreased in a pattern of severe LVH. Systolic function wasvigorous. The estimated ejection fraction was in the range of65%to 70%.Wall motion was normal; there were no regional wallmotion abnormalities. Doppler parameters are consistent withabnormal left ventricular relaxation (grade 1 diastolicdysfunction). The E/e&' ratio is >15, suggesting elevated LVfilling pressure. - Aortic valve: Trileaflet. Mobile strands at the leaflet tips -could be Lambl&'s excrescences (normal variant) or less likelythrombus/vegetation. - Mitral valve: Calcified annulus. Mildly thickened leaflets . Mildregurgitation. There is >25% mitral inflow variation withrespiration. - Left atrium: Severely dilated. - Right ventricle: The cavity size was normal. There is thickeningof the RV free wall with a mass noted at the RV/RA junction -this could represent thrombus or malignancy. Systolic functionwas normal. Diastolic collapse is noted. - Right atrium: Normal size. Diastolic collapse of the free wall isnoted. - Tricuspid valve: There was trivial regurgitation. Greater than35% tricuspid inflow variation with respiration. - Pulmonary arteries: Dilated. - Systemic veins: The IVC measures <2.1 cm, but does not collapse >50%, suggesting an elevated RA pressure of 8 mmHg. - Pericardium, extracardiac: Moderate to large circumferential pericardial effusion. There was a right pleural effusion. There was a left pleural effusion. Ascites is noted.  Impressions: Compared to a prior study in 07/2016, the pericardial effusion is larger - now moderate to severe with associated tamponade features (dilated IVC, RV and RA free wall collapse, mitral and tricuspid inflow variation),bilateral pleural effusions and ascites are noted. There is thickening of the RV free wall and a gelatinous mass noted at the RA/RV junction measuring about 1.5 cm in diameter. Clinical correlation with tamponade  physiology is highly recommended.  Steward Bing, DO 12/27/2016, 1:27 PM PGY-1, [place]

## 2016-12-27 NOTE — Progress Notes (Signed)
Family Medicine Teaching Service Daily Progress Note Intern Pager: [phone]  Patient name: Lisa [name] Medical record number: [id number] Date of birth: [birth year] Age: 61 y.o. Gender: female  Primary Care Provider: Marina Goodell, MD Consultants: Cardiology  Code Status: Full   Pt Overview and Major Events to Date:  04/02: Admit to [facility] in the morning, received left thoracentesis, transferred to [facility] for pericardiocentesis, then transferred to [place] and Plan: Lisa [name] is a 61 year old female presenting with worsening leg swelling, fatigue, abdominal and breast swelling x 2 weeks, found to have large left pleural effusion S/P thoracentesis on 4/2 at [facility]. Also found to have pericardial effusion concerning for cardiac tamponade on, transferred to [facility] for pericardiocentesis. Past medical history significant for hepatocellular carcinoma, portal vein thrombosis, hepatitis C, polysubstance abuse, chronic back pain. Patient admitted to [facility] and was transferred to [facility] on April 2 for further care.  #Dyspnea  Left-Sided Pleural Effusion: Acute. S/P thoracocentesis with 100 mL of clear yellow fluid drawn. Dyspnea improved. Likely malignant. No organisms seen. Lights criteria pending. Repeat CXR showed improved effusion. --Monitor respiratory status --Consider repeat CXR if dyspnea returns --Repeat echo in morning and pull drain if no significant effusion remains --Follow-up Lights criteria  #Pericardial effusion with tamponade:  Found on echo 4/2. Pericardiocentesis performed 4/2 with 600 mL with drain placed. VS remained stable. Also concerning for malignancy. --Cardiology consulted, appreciate recommendations --Continuous cardiac monitoring --Morphine 2 mq q4h PRN pain  #Cardiac Mass: Gelatinous mass noted at the RA/RV junction measuring 1.5 cm in diameter. Possibly malignant in etiology given history of hepatocellular carcinoma. --See above  #Multifocal  Hepatocellular Carcinoma, Stage II: Diagnosed 07/2016, on Nivolumab q2 weeks with oncologist. Lisa [name] initiated on 11/24/2016. Status post IV albumin and IV Lasix at [facility]. --Monitor vitals and for signs of edema --Maintain caution using diuretics given pleural and pericardial tap  #Liver Cirrhosis with Ascites, H/o Treated Hep C: Secondary to liver cancer, no signs of SBP. AST 115, ALT and ALP WNL. --Paracentesis pending --Discontinuing Lasix given low BPs --Daily CMET  #Polysubstance Abuse: History of tobacco abuse as well as alcohol, cocaine, and marijuana. UDS positive for cocaine on admission. 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 Not on chronic anticoagulation per her discussion with oncology with risks outweighing benefits.  #Hypertension: BP stabilizing 100s/60s. Home med include ACE-I and CCB. --Hold home lisinopril 20 mg QD and amlodipine 10 mg QD --Lasix per above  #Chronic back pain: --Continue home tramadol 50 mg q6h PRN  #Thrombocytopenia: Platelets 108 likely secondary to hepatic cirrhosis. INR at 1.23.  --Monitor for any signs of bleeding while on heparin and ASA  #Hyponatremia: Likely secondary to cirrhosis and or HCTZ use which was recently discontinued by her oncologist. --CMET daily  #Suspected COPD: On dulera and albuterol at home. --Dulera 1 puff BID, albuterol PRN  FEN/GI: Clear liquid diet, SLIV PPx: SCDs  Disposition: Pending workup for fluid overload and new finding for heart mass.  Subjective:  Patient states she feels much better since paracentesis and her pericardiocentesis. No complaints morning.  Objective: Temp:  [97.8 F (36.6 C)-98.4 F (36.9 C)] 98.1 F (36.7 C) (04/03 0400) Pulse Rate:  [78-100] 94 (04/03 0500) Resp:  [11-22] 11 (04/03 0500) BP: (101-160)/(41-78) 107/61 (04/03 0500) SpO2:  [94 %-100 %] 97 % (04/03 0500) Weight:  [136 lb 3.9 oz (61.8  kg)-136 lb 11 oz (62 kg)] 136 lb 11 oz (62 kg) (  04/02 1950) Physical Exam: General: well nourished, well developed, in no acute distress with non-toxic appearance HEENT: normocephalic, atraumatic, moist mucous membranes Neck: supple, non-tender without lymphadenopathy CV: regular rate and rhythm without murmurs, rubs, or gallops Lungs: clear to auscultation bilaterally with normal work of breathing Abdomen: soft, non-tender, no masses or organomegaly palpable, normoactive bowel sounds Skin: warm, dry, no rashes or lesions, cap refill < 2 seconds Extremities: warm and well perfused, normal tone  Laboratory:  Recent Labs Lab 12/22/16 1205 01/07/2017 0445 12/31/2016 2005  WBC 7.6 10.5 9.7  HGB 15.2 14.2 13.8  HCT 42.8 39.3 38.5  PLT 108* 103* 108*    Recent Labs Lab 12/22/16 1205 12/25/2016 0445 01/05/2017 2005 12/27/16 0242  NA 128* 129*  --  128*  K 4.6 4.9  --  5.5*  CL  --  101  --  100*  CO2 22 24  --  22  BUN 13.2 21*  --  19  CREATININE 0.8 0.85 0.88 0.94  CALCIUM 8.3* 8.3*  --  8.0*  PROT 7.4 7.6  --  7.7  BILITOT 1.55* 1.7*  --  1.7*  ALKPHOS 108 117  --  103  ALT 26 38  --  37  AST 79* 115*  --  109*  GLUCOSE 105 77 97 119*   UDS: +Cocaine HIV: Non-reactive Lactate dehydrogenase: 305 > 91 (H) INR: 1.23   Imaging/Diagnostic Tests: US THORACENTESIS ASP PLEURAL SPACE W/IMG GUIDE (01/12/2017) FINDINGS: A total of approximately 800 mL of clear, yellow fluid was removed. Samples were sent to the laboratory as requested by the clinical team.  IMPRESSION: Successful ultrasound guided diagnostic and therapeutic left thoracentesis yielding 800 mL of pleural fluid.  DG Chest 2 View (12/25/2016) FINDINGS: Moderate left pleural effusion. Mild atelectasis or consolidation adjacent to the pleural effusion. The right lung is clear. Pulmonary vasculature is normal.  IMPRESSION: New left pleural effusion with adjacent left lung base atelectasis or consolidation.  DG  Chest 1 View (12/28/2016) FINDINGS: Stable cardiomegaly. Atherosclerosis of thoracic aorta is noted. No pneumothorax is seen status post left thoracentesis. Left pleural effusion is significantly smaller. Left basilar atelectasis or infiltrate is noted. Mild right basilar atelectasis is noted as well.  IMPRESSION: No pneumothorax status post left-sided thoracentesis. Left pleural effusion is significantly smaller.  Echocardiogram (01/22/2017) Study Conclusions - Left ventricle: The cavity size was normal. Wall thickness was increased in a pattern of severe LVH. Systolic function was vigorous. The estimated ejection fraction was in the range of 65% to 70%. Wall motion was normal; there were no regional wall motion abnormalities. Doppler parameters are consistent with abnormal left ventricular relaxation (grade 1 diastolic dysfunction). The E/e&' ratio is >15, suggesting elevated LV filling pressure. - Aortic valve: Trileaflet. Mobile strands at the leaflet tips - could be Lambl&'s excrescences (normal variant) or less likely thrombus/vegetation. - Mitral valve: Calcified annulus. Mildly thickened leaflets . Mild regurgitation. There is >25% mitral inflow variation with respiration. - Left atrium: Severely dilated. - Right ventricle: The cavity size was normal. There is thickening of the RV free wall with a mass noted at the RV/RA junction - this could represent thrombus or malignancy. Systolic function was normal. Diastolic collapse is noted. - Right atrium: Normal size. Diastolic collapse of the free wall is noted. - Tricuspid valve: There was trivial regurgitation. Greater than 35% tricuspid inflow variation with respiration. - Pulmonary arteries: Dilated. - Systemic veins: The IVC measures <2.1 cm, but does not collapse >50%, suggesting an elevated RA  pressure of 8 mmHg. - Pericardium, extracardiac: Moderate to large  circumferential pericardial effusion. There was a right pleural effusion. There was a left pleural effusion. Ascites is noted.  Impressions: - Compared to a prior study in 07/2016, the pericardial effusion is larger - now moderate to severe with associated tamponade features (dilated IVC, RV and RA free wall collapse, mitral and tricuspid inflow variation), bilateral pleural effusions and ascites are noted. There is thickening of the RV free wall and a gelatinous mass noted at the RA/RV junction measuring about 1.5 cm in diameter. Clinical correlation with tamponade physiology is highly recommended.  [place] Bing, DO 12/27/2016, 6:05 AM PGY-1, [place] Intern pager: [phone], text pages welcome

## 2016-12-28 ENCOUNTER — Other Ambulatory Visit: Payer: Self-pay | Admitting: Hematology

## 2016-12-28 ENCOUNTER — Ambulatory Visit: Payer: Medicaid Other | Admitting: Hematology

## 2016-12-28 ENCOUNTER — Inpatient Hospital Stay (HOSPITAL_COMMUNITY): Payer: Medicaid Other

## 2016-12-28 DIAGNOSIS — R0600 Dyspnea, unspecified: Secondary | ICD-10-CM

## 2016-12-28 DIAGNOSIS — I10 Essential (primary) hypertension: Secondary | ICD-10-CM

## 2016-12-28 DIAGNOSIS — R109 Unspecified abdominal pain: Secondary | ICD-10-CM

## 2016-12-28 DIAGNOSIS — I319 Disease of pericardium, unspecified: Secondary | ICD-10-CM

## 2016-12-28 DIAGNOSIS — R601 Generalized edema: Secondary | ICD-10-CM

## 2016-12-28 DIAGNOSIS — R600 Localized edema: Secondary | ICD-10-CM

## 2016-12-28 LAB — COMPREHENSIVE METABOLIC PANEL
ALT: 32 U/L (ref 14–54)
AST: 85 U/L — AB (ref 15–41)
Albumin: 2.1 g/dL — ABNORMAL LOW (ref 3.5–5.0)
Alkaline Phosphatase: 103 U/L (ref 38–126)
Anion gap: 5 (ref 5–15)
BUN: 17 mg/dL (ref 6–20)
CHLORIDE: 99 mmol/L — AB (ref 101–111)
CO2: 22 mmol/L (ref 22–32)
CREATININE: 0.74 mg/dL (ref 0.44–1.00)
Calcium: 8.1 mg/dL — ABNORMAL LOW (ref 8.9–10.3)
GFR calc non Af Amer: >60 mL/min (ref >60)
Glucose, Bld: 103 mg/dL — ABNORMAL HIGH (ref 65–99)
POTASSIUM: 5.6 mmol/L — AB (ref 3.5–5.1)
SODIUM: 126 mmol/L — AB (ref 135–145)
Total Bilirubin: 1.9 mg/dL — ABNORMAL HIGH (ref 0.3–1.2)
Total Protein: 7.8 g/dL (ref 6.5–8.1)

## 2016-12-28 LAB — PH, BODY FLUID: pH, Body Fluid: 7.9

## 2016-12-28 LAB — CBC
HCT: 43.4 % (ref 36.0–46.0)
Hemoglobin: 15.5 g/dL — ABNORMAL HIGH (ref 12.0–15.0)
MCH: 30.3 pg (ref 26.0–34.0)
MCHC: 35.7 g/dL (ref 30.0–36.0)
MCV: 84.9 fL (ref 78.0–100.0)
PLATELETS: 119 10*3/uL — AB (ref 150–400)
RBC: 5.11 MIL/uL (ref 3.87–5.11)
RDW: 15.6 % — AB (ref 11.5–15.5)
WBC: 15.7 10*3/uL — AB (ref 4.0–10.5)

## 2016-12-28 LAB — ECHOCARDIOGRAM LIMITED
FS: 43 % (ref 28–44)
Height: 63 in
IVS/LV PW RATIO, ED: 0.9
LA diam index: 1.86 cm/m2
LASIZE: 31 mm
LEFT ATRIUM END SYS DIAM: 31 mm
PW: 18.9 mm — AB (ref 0.6–1.1)
Weight: 2186.96 oz

## 2016-12-28 LAB — MISC LABCORP TEST (SEND OUT): Labcorp test code: 100156

## 2016-12-28 LAB — PROTEIN, BODY FLUID (OTHER): TOTAL PROTEIN, BODY FLUID OTHER: 5.6 g/dL

## 2016-12-28 LAB — TSH: TSH: 3.462 u[IU]/mL (ref 0.350–4.500)

## 2016-12-28 MED ORDER — FUROSEMIDE 20 MG PO TABS: 20.0000 mg | ORAL_TABLET | Freq: Every day | ORAL | Status: DC

## 2016-12-28 NOTE — Progress Notes (Addendum)
Progress Note  Patient Name: Lisa [name] Date of Encounter: 12/28/2016  Primary Cardiologist: Lendell Caprice  Subjective   Feels better. Throat discomfort has improved.  Main complaint today is constipation.  Inpatient Medications    Scheduled Meds: . aspirin EC  81 mg Oral Daily  . mometasone-formoterol  1 puff Inhalation BID  . sodium chloride flush  3 mL Intravenous Q12H  . vitamin B-1  250 mg Oral Daily  . vitamin B-12  1,000 mcg Oral Daily   Continuous Infusions:  PRN Meds: sodium chloride, acetaminophen, albuterol, bisacodyl, hydrOXYzine, morphine injection, ondansetron **OR** ondansetron (ZOFRAN) IV, ondansetron (ZOFRAN) IV, sodium chloride flush, traMADol   Vital Signs    Vitals:   12/28/16 0600 12/28/16 0700 12/28/16 0724 12/28/16 0848  BP: (!) 161/76 136/67    Pulse: (!) 54 (!) 103    Resp: (!) 28 11    Temp:   98.1 F (36.7 C)   TempSrc:   Oral   SpO2: 97% 97%  99%  Weight:      Height:        Intake/Output Summary (Last 24 hours) at 12/28/16 0951 Last data filed at 12/28/16 0926  Gross per 24 hour  Intake              973 ml  Output              775 ml  Net              198 ml   Filed Weights   01/22/2017 0423 01/10/2017 0835 01/08/2017 1950  Weight: (S) 135 lb 2 oz (61.3 kg) 136 lb 3.9 oz (61.8 kg) 136 lb 11 oz (62 kg)    Telemetry    Sinus tachycardia and sinus rhythm - Personally Reviewed  ECG    No EKG performed during this admission. Will get one performed today. - Personally Reviewed  Physical Exam  Comfortable, awakened her from sleep. GEN: No acute distress.   Neck: No JVD Cardiac: RRR, no murmurs or gallops. Soft paracardial rub is heard. Respiratory: Clear to auscultation bilaterally. GI: Soft, nontender, non-distended  MS: No edema; No deformity. Neuro:  Nonfocal  Psych: Normal affect   Labs    Chemistry Recent Labs Lab 01/10/2017 0445 01/17/2017 2005 12/27/16 0242 12/28/16 0204  NA 129*  --  128* 126*  K 4.9  --  5.5*  5.6*  CL 101  --  100* 99*  CO2 24  --  22 22  GLUCOSE 77 97 119* 103*  BUN 21*  --  19 17  CREATININE 0.85 0.88 0.94 0.74  CALCIUM 8.3*  --  8.0* 8.1*  PROT 7.6  --  7.7 7.8  ALBUMIN 2.4*  --  2.4* 2.1*  AST 115*  --  109* 85*  ALT 38  --  37 32  ALKPHOS 117  --  103 103  BILITOT 1.7*  --  1.7* 1.9*  GFRNONAA >60 >60 >60 >60  GFRAA >60 >60 >60 >60  ANIONGAP 4*  --  6 5     Hematology Recent Labs Lab 01/11/2017 0445 12/25/2016 2005 12/28/16 0204  WBC 10.5 9.7 15.7*  RBC 4.60 4.49 5.11  HGB 14.2 13.8 15.5*  HCT 39.3 38.5 43.4  MCV 85.4 85.7 84.9  MCH 30.9 30.7 30.3  MCHC 36.1* 35.8 35.7  RDW 15.9* 15.8* 15.6*  PLT 103* 108* 119*    Cardiac EnzymesNo results for input(s): TROPONINI in the last 168 hours. No results for input(s):  TROPIPOC in the last 168 hours.   BNPNo results for input(s): BNP, PROBNP in the last 168 hours.   DDimer No results for input(s): DDIMER in the last 168 hours.   Radiology    Dg Chest 1 View  Result Date: 01/08/2017 CLINICAL DATA:  Status post left thoracentesis. EXAM: CHEST 1 VIEW COMPARISON:  Radiographs of same day. FINDINGS: Stable cardiomegaly. Atherosclerosis of thoracic aorta is noted. No pneumothorax is seen status post left thoracentesis. Left pleural effusion is significantly smaller. Left basilar atelectasis or infiltrate is noted. Mild right basilar atelectasis is noted as well. IMPRESSION: No pneumothorax status post left-sided thoracentesis. Left pleural effusion is significantly smaller. Electronically Signed   By: Marijo Conception, M.D.   On: 12/25/2016 15:53   Ir Paracentesis  Result Date: 12/27/2016 INDICATION: Hepatocellular carcinoma. Ascites. Request for diagnostic and therapeutic paracentesis. EXAM: ULTRASOUND GUIDED RIGHT LATERAL ABDOMEN PARACENTESIS MEDICATIONS: 1% Lidocaine = 10 mL. COMPLICATIONS: None immediate. PROCEDURE: Informed written consent was obtained from the patient after a discussion of the risks, benefits and  alternatives to treatment. A timeout was performed prior to the initiation of the procedure. Initial ultrasound scanning demonstrates a small amount of ascites within the right lateral abdomen. The right lateral abdomen was prepped and draped in the usual sterile fashion. 1% lidocaine with epinephrine was used for local anesthesia. Following this, a 19 gauge, 7-cm, Yueh catheter was introduced. An ultrasound image was saved for documentation purposes. The paracentesis was performed. The catheter was removed and a dressing was applied. The patient tolerated the procedure well without immediate post procedural complication. FINDINGS: A total of approximately 450 mL of clear yellow fluid was removed. Samples were sent to the laboratory as requested by the clinical team. IMPRESSION: Successful ultrasound-guided paracentesis yielding 450 mL of peritoneal fluid. Read by:  Gareth Eagle, PA-C Electronically Signed   By: Markus Daft M.D.   On: 12/27/2016 14:06   US Thoracentesis Asp Pleural Space W/img Guide  Result Date: 12/25/2016 INDICATION: Patient with history of hepatocellular carcinoma and cirrhosis presents with left pleural effusion. Request is made for diagnostic and therapeutic left thoracentesis. EXAM: ULTRASOUND GUIDED DIAGNOSTIC AND THERAPEUTIC LEFT THORACENTESIS MEDICATIONS: 10 mL 1% lidocaine COMPLICATIONS: None immediate. PROCEDURE: An ultrasound guided thoracentesis was thoroughly discussed with the patient and questions answered. The benefits, risks, alternatives and complications were also discussed. The patient understands and wishes to proceed with the procedure. Written consent was obtained. Ultrasound was performed to localize and mark an adequate pocket of fluid in the left chest. The area was then prepped and draped in the normal sterile fashion. 1% Lidocaine was used for local anesthesia. Under ultrasound guidance a Safe-T-centesis catheter was introduced. Thoracentesis was performed. The catheter  was removed and a dressing applied. FINDINGS: A total of approximately 800 mL of clear, yellow fluid was removed. Samples were sent to the laboratory as requested by the clinical team. IMPRESSION: Successful ultrasound guided diagnostic and therapeutic left thoracentesis yielding 800 mL of pleural fluid. Read by:  Brynda Greathouse PA-C Electronically Signed   By: Markus Daft M.D.   On: 01/05/2017 15:59    Cardiac Studies   Personally reviewed echo of 12/31/2016.  Echo ordered today, not yet performed.  Patient Profile     61 y.o. female with hepatocellular cancer, metastatic with presentation including pleural effusion, pericardial effusion and ascites. Now s/p pericardiocentesis and pleural tap. Feels better.  Assessment & Plan    1. Large pericardial effusion with tamponade physiology, relieved after pericardiocentesis. Plan  today is repeat the echo to assess effusion size prior to pulling drain. If effusion rapidly recurs, we'll have to consider palliative  for it pericardial window by thoracic surgery. No obvious cancer cells noted in pericardial fluid analysis.  2. Hepatocellular carcinoma, widely metastatic    Addendum:  Late afternoon repeat echo demonstrated resolution of pericardial effusion.  Pericardial drain pulled without complications at around 2:13 PM 12/28/16   Signed, Sinclair Grooms, MD  12/28/2016, 9:51 AM

## 2016-12-28 NOTE — Progress Notes (Signed)
  Echocardiogram 2D Echocardiogram has been performed.  Lisa [name] 12/28/2016, 3:47 PM

## 2016-12-28 NOTE — Progress Notes (Signed)
Family Medicine Teaching Service Daily Progress Note Intern Pager: [phone]  Patient name: Lisa [name] Medical record number: [id number] Date of birth: [birth year] Age: 61 y.o. Gender: female  Primary Care Provider: Marina Goodell, MD Consultants: Cardiology  Code Status: Full   Pt Overview and Major Events to Date:  04/02: Admit to [facility] in [facility] morning, received left thoracentesis, transferred to [facility] for pericardiocentesis, then transferred to [facility] and Plan: Lisa [name] is a 61 year old female presenting with worsening leg swelling, fatigue, abdominal and breast swelling x 2 weeks, found to have large left pleural effusion S/P thoracentesis on 4/2 at [facility]. Also found to have pericardial effusion concerning for cardiac tamponade on, transferred to [facility] for pericardiocentesis. Past medical history significant for hepatocellular carcinoma, portal vein thrombosis, hepatitis C, polysubstance abuse, chronic back pain. Patient admitted to [facility] and was transferred to [facility] on April 2 for further care.  #Dyspnea  Left-Sided Pleural Effusion: Resolved. S/P thoracocentesis and pleurocentesis. No organisms seen. Lights criteria transudative. Repeat CXR showed improved effusion. --Monitor respiratory status --Consider repeat CXR if dyspnea returns --Repeat echo in morning and pull drain if no significant effusion remains  #Pericardial Effusion with Tamponade:  Found on echo 4/2. Pericardiocentesis performed 4/2 with 600 mL with drain placed. VS remained stable. Could be secondary to chemotherapy versus malignancy though fluid transudative. --Cardiology consulted, appreciate recommendations --Continuous cardiac monitoring --Morphine 2 mq q4h PRN pain  #Cardiac Mass: Gelatinous mass noted at the RA/RV junction measuring 1.5 cm in diameter. Possibly malignant in etiology given history of hepatocellular carcinoma. --See above  #Multifocal Hepatocellular Carcinoma,  Stage II: Diagnosed 07/2016, on Nivolumab q2 weeks with oncologist. Lisa [name] initiated on 11/24/2016. Status post IV albumin and IV Lasix at [facility]. --Monitor vitals and for signs of edema --Maintain caution using diuretics given pleural and pericardial tap  #Liver Cirrhosis with Ascites, H/o Treated Hep C: Stable. Paracentesis labs support transudative effusion. Secondary to liver cancer, no signs of SBP. AST decreasing, ALT and ALP WNL. --Discontinuing Lasix given low BPs --Daily CMET  #Polysubstance Abuse: History of tobacco abuse as well as alcohol, cocaine, and marijuana. UDS positive for cocaine on admission. 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 Not on chronic anticoagulation per her discussion with oncology with risks outweighing benefits.  #Hypertension: Elevated highest 749 systolic and 449 diastolic overnight. Normalized 136/67. Home med include ACE-I and CCB. --Hold home lisinopril 20 mg QD and amlodipine 10 mg QD  #Chronic back pain: No change from baseline. --Continue home tramadol 50 mg q6h PRN  #Thrombocytopenia: Improving. Platelets 119 today. Likely secondary to hepatic cirrhosis. INR at 1.23.  --Monitor for any signs of bleeding while on heparin and ASA  #Hyponatremia: Persists at 126 today. Likely secondary to cirrhosis and or HCTZ use which was recently discontinued by her oncologist. --CMET daily  #Suspected COPD: On dulera and albuterol at home. --Dulera 1 puff BID, albuterol PRN  FEN/GI: Clear liquid diet, SLIV PPx: SCDs  Disposition: Pending workup for fluid overload and new finding for heart mass.  Subjective:  Patient's she feels better today. Trying to have a bowel movement, last 3 days ago. Continues to pass gas. Has some abdominal pain when bearing down. Denies shortness of breath or chest pain. Asking questions about heart mass.  Objective: Temp:  [97 F (36.1 C)-98.9  F (37.2 C)] 98.9 F (37.2 C) (04/04 0400) Pulse Rate:  [47-109] 103 (04/04 0700) Resp:  [10-28] 11 (  04/04 0700) BP: (114-184)/(55-118) 136/67 (04/04 0700) SpO2:  [94 %-99 %] 97 % (04/04 0700) Physical Exam: General: well nourished, well developed, in no acute distress with non-toxic appearance HEENT: normocephalic, atraumatic, moist mucous membranes CV: regular rate and rhythm without murmurs, rubs, or gallops Lungs: clear to auscultation bilaterally with normal work of breathing Abdomen: soft, non-tender, no masses or organomegaly palpable, normoactive bowel sounds Skin: warm, dry, no rashes or lesions, cap refill < 2 seconds Extremities: warm and well perfused, normal tone  Laboratory:  Recent Labs Lab 01/02/2017 0445 01/06/2017 2005 12/28/16 0204  WBC 10.5 9.7 15.7*  HGB 14.2 13.8 15.5*  HCT 39.3 38.5 43.4  PLT 103* 108* 119*    Recent Labs Lab 01/09/2017 0445 01/06/2017 2005 12/27/16 0242 12/28/16 0204  NA 129*  --  128* 126*  K 4.9  --  5.5* 5.6*  CL 101  --  100* 99*  CO2 24  --  22 22  BUN 21*  --  19 17  CREATININE 0.85 0.88 0.94 0.74  CALCIUM 8.3*  --  8.0* 8.1*  PROT 7.6  --  7.7 7.8  BILITOT 1.7*  --  1.7* 1.9*  ALKPHOS 117  --  103 103  ALT 38  --  37 32  AST 115*  --  109* 85*  GLUCOSE 77 97 119* 103*   UDS: +Cocaine HIV: Non-reactive Lactate dehydrogenase: 305 > 91 (H) INR: 1.23  TSH: 3.462 (WNL)  Imaging/Diagnostic Tests: Pericardiocentesis (01/23/2017) Successful pericardiocentesis with 600 mL of fluid removed. Monitor output of the drain. Hopefully, this can be removed soon. She does have significant ascites which may need to be managed with paracentesis.  US THORACENTESIS ASP PLEURAL SPACE W/IMG GUIDE (01/21/2017) FINDINGS: A total of approximately 800 mL of clear, yellow fluid was removed. Samples were sent to the laboratory as requested by the clinical team.  IMPRESSION: Successful ultrasound guided diagnostic and therapeutic left  thoracentesis yielding 800 mL of pleural fluid.  DG Chest 2 View (01/07/2017) FINDINGS: Moderate left pleural effusion. Mild atelectasis or consolidation adjacent to the pleural effusion. The right lung is clear. Pulmonary vasculature is normal.  IMPRESSION: New left pleural effusion with adjacent left lung base atelectasis or consolidation.  DG Chest 1 View (01/17/2017) FINDINGS: Stable cardiomegaly. Atherosclerosis of thoracic aorta is noted. No pneumothorax is seen status post left thoracentesis. Left pleural effusion is significantly smaller. Left basilar atelectasis or infiltrate is noted. Mild right basilar atelectasis is noted as well.  IMPRESSION: No pneumothorax status post left-sided thoracentesis. Left pleural effusion is significantly smaller.  Echocardiogram (01/19/2017) Study Conclusions - Left ventricle: The cavity size was normal. Wall thickness was increased in a pattern of severe LVH. Systolic function was vigorous. The estimated ejection fraction was in the range of 65% to 70%. Wall motion was normal; there were no regional wall motion abnormalities. Doppler parameters are consistent with abnormal left ventricular relaxation (grade 1 diastolic dysfunction). The E/e&' ratio is >15, suggesting elevated LV filling pressure. - Aortic valve: Trileaflet. Mobile strands at the leaflet tips - could be Lambl&'s excrescences (normal variant) or less likely thrombus/vegetation. - Mitral valve: Calcified annulus. Mildly thickened leaflets . Mild regurgitation. There is >25% mitral inflow variation with respiration. - Left atrium: Severely dilated. - Right ventricle: The cavity size was normal. There is thickening of the RV free wall with a mass noted at the RV/RA junction - this could represent thrombus or malignancy. Systolic function was normal. Diastolic collapse is noted. - Right atrium: Normal  size. Diastolic collapse of the free wall  is noted. - Tricuspid valve: There was trivial regurgitation. Greater than 35% tricuspid inflow variation with respiration. - Pulmonary arteries: Dilated. - Systemic veins: The IVC measures <2.1 cm, but does not collapse >50%, suggesting an elevated RA pressure of 8 mmHg. - Pericardium, extracardiac: Moderate to large circumferential pericardial effusion. There was a right pleural effusion. There was a left pleural effusion. Ascites is noted.  Impressions: - Compared to a prior study in 07/2016, the pericardial effusion is larger - now moderate to severe with associated tamponade features (dilated IVC, RV and RA free wall collapse, mitral and tricuspid inflow variation), bilateral pleural effusions and ascites are noted. There is thickening of the RV free wall and a gelatinous mass noted at the RA/RV junction measuring about 1.5 cm in diameter. Clinical correlation with tamponade physiology is highly recommended.  [place], DO 12/28/2016, 7:05 AM PGY-1, [place] Intern pager: [phone], text pages welcome

## 2016-12-28 NOTE — Progress Notes (Signed)
Lisa [name]   DOB:Apr 24, 1956   NI#:[id number]   FGH#:[id number]  Oncology follow up   Subjective: Pt was admitted for worsening abdominal and leg swelling and dyspnea in 01/09/2017. Workup showed pericardiac effusion and left pleural effusion, which have all be drained, and also had paracentesis since her admission. She states her dyspnea has improved, she still feels quite fatigued, and reports gastric discomfort, especially after eating.   Objective:  Vitals:   12/28/16 1900 12/28/16 2000  BP: (!) 151/96 (!) 146/76  Pulse: 91 98  Resp: 15 19  Temp: 97.5 F (36.4 C)     Body mass index is 24.21 kg/m.  Intake/Output Summary (Last 24 hours) at 12/28/16 2102 Last data filed at 12/28/16 1000  Gross per 24 hour  Intake              113 ml  Output              375 ml  Net             -262 ml     Sclerae unicteric  Oropharynx clear  No peripheral adenopathy  Lungs clear -- no rales or rhonchi  Heart regular rate and rhythm  Abdomen soft, nontender   MSK no focal spinal tenderness, no peripheral edema  Neuro nonfocal  CBG (last 3)  No results for input(s): GLUCAP in the last 72 hours.   Labs:  Lab Results  Component Value Date   WBC 15.7 (H) 12/28/2016   HGB 15.5 (H) 12/28/2016   HCT 43.4 12/28/2016   MCV 84.9 12/28/2016   PLT 119 (L) 12/28/2016   NEUTROABS 4.9 01/06/2017   CMP Latest Ref Rng & Units 12/28/2016 12/27/2016 12/27/2016  Glucose 65 - 99 mg/dL 103(H) 119(H) 97  BUN 6 - 20 mg/dL 17 19 -  Creatinine 0.44 - 1.00 mg/dL 0.74 0.94 0.88  Sodium 135 - 145 mmol/L 126(L) 128(L) -  Potassium 3.5 - 5.1 mmol/L 5.6(H) 5.5(H) -  Chloride 101 - 111 mmol/L 99(L) 100(L) -  CO2 22 - 32 mmol/L 22 22 -  Calcium 8.9 - 10.3 mg/dL 8.1(L) 8.0(L) -  Total Protein 6.5 - 8.1 g/dL 7.8 7.7 -  Total Bilirubin 0.3 - 1.2 mg/dL 1.9(H) 1.7(H) -  Alkaline Phos 38 - 126 U/L 103 103 -  AST 15 - 41 U/L 85(H) 109(H) -  ALT 14 - 54 U/L 32 37 -    Urine Studies No results for input(s): UHGB, CRYS  in the last 72 hours.  Invalid input(s): UACOL, UAPR, USPG, UPH, UTP, UGL, UKET, UBIL, UNIT, UROB, [place], UEPI, UWBC, [place], [place], [place], [place], [place]  Basic Metabolic Panel:  Recent Labs Lab 12/22/16 1205  12/25/2016 0445 01/05/2017 2005 12/27/16 0242 12/28/16 0204  NA 128*  --  129*  --  128* 126*  K 4.6  < > 4.9  --  5.5* 5.6*  CL  --   --  101  --  100* 99*  CO2 22  --  24  --  22 22  GLUCOSE 105  --  77 97 119* 103*  BUN 13.2  --  21*  --  19 17  CREATININE 0.8  --  0.85 0.88 0.94 0.74  CALCIUM 8.3*  --  8.3*  --  8.0* 8.1*  < > = values in this interval not displayed. GFR Estimated Creatinine Clearance: 61.1 mL/min (by C-G formula based on SCr of 0.74 mg/dL). Liver Function Tests:  Recent Labs Lab 12/22/16 1205 01/18/2017 0445  12/27/16 0242 12/28/16 0204  AST 79* 115* 109* 85*  ALT 26 38 37 32  ALKPHOS 108 117 103 103  BILITOT 1.55* 1.7* 1.7* 1.9*  PROT 7.4 7.6 7.7 7.8  ALBUMIN 2.3* 2.4* 2.4* 2.1*   No results for input(s): LIPASE, AMYLASE in the last 168 hours. No results for input(s): AMMONIA in the last 168 hours. Coagulation profile  Recent Labs Lab 01/13/2017 0445  INR 1.23    CBC:  Recent Labs Lab 12/22/16 1205 01/12/2017 0445 01/09/2017 2005 12/28/16 0204  WBC 7.6 10.5 9.7 15.7*  NEUTROABS 3.0 4.9  --   --   HGB 15.2 14.2 13.8 15.5*  HCT 42.8 39.3 38.5 43.4  MCV 88.4 85.4 85.7 84.9  PLT 108* 103* 108* 119*   Cardiac Enzymes: No results for input(s): CKTOTAL, CKMB, CKMBINDEX, TROPONINI in the last 168 hours. BNP: Invalid input(s): POCBNP CBG: No results for input(s): GLUCAP in the last 168 hours. D-Dimer No results for input(s): DDIMER in the last 72 hours. Hgb A1c No results for input(s): HGBA1C in the last 72 hours. Lipid Profile No results for input(s): CHOL, HDL, LDLCALC, TRIG, CHOLHDL, LDLDIRECT in the last 72 hours. Thyroid function studies  Recent Labs  12/28/16 0204  TSH 3.462   Anemia work up No results for input(s): VITAMINB12,  FOLATE, FERRITIN, TIBC, IRON, RETICCTPCT in the last 72 hours. Microbiology Recent Results (from the past 240 hour(s))  Body fluid culture     Status: None (Preliminary result)   Collection Time: 12/31/2016  3:45 PM  Result Value Ref Range Status   Specimen Description PLEURAL LEFT  Final   Special Requests NONE  Final   Gram Stain   Final    RARE WBC PRESENT, PREDOMINANTLY MONONUCLEAR NO ORGANISMS SEEN    Culture   Final    NO GROWTH 2 DAYS Performed at [facility], [address], [place], [place] [address]    Report Status PENDING  Incomplete  Culture, body fluid-bottle     Status: None (Preliminary result)   Collection Time: 01/12/2017  6:23 PM  Result Value Ref Range Status   Specimen Description FLUID PERICARDIAL  Final   Special Requests BOTTLES DRAWN AEROBIC AND ANAEROBIC 10CC  Final   Culture NO GROWTH 2 DAYS  Final   Report Status PENDING  Incomplete  Gram stain     Status: None   Collection Time: 01/06/2017  6:23 PM  Result Value Ref Range Status   Specimen Description FLUID PERICARDIAL  Final   Special Requests NONE  Final   Gram Stain   Final    WBC PRESENT, PREDOMINANTLY MONONUCLEAR WBC PRESENT, PREDOMINANTLY PMN NO ORGANISMS SEEN CYTOSPIN SMEAR    Report Status 01/17/2017 FINAL  Final  MRSA PCR Screening     Status: None   Collection Time: 01/07/2017  7:35 PM  Result Value Ref Range Status   MRSA by PCR NEGATIVE NEGATIVE Final    Comment:        The GeneXpert MRSA Assay (FDA approved for NASAL specimens only), is one component of a comprehensive MRSA colonization surveillance program. It is not intended to diagnose MRSA infection nor to guide or monitor treatment for MRSA infections.   Gram stain     Status: None   Collection Time: 12/27/16 12:31 PM  Result Value Ref Range Status   Specimen Description PERITONEAL  Final   Special Requests NONE  Final   Gram Stain   Final    FEW WBC PRESENT,BOTH PMN  AND MONONUCLEAR NO ORGANISMS SEEN    Report  Status 12/27/2016 FINAL  Final  Culture, body fluid-bottle     Status: None (Preliminary result)   Collection Time: 12/27/16 12:31 PM  Result Value Ref Range Status   Specimen Description PERITONEAL  Final   Special Requests NONE  Final   Culture NO GROWTH < 24 HOURS  Final   Report Status PENDING  Incomplete   PATHOLOGY Diagnosis 01/07/2017 PERICARDIAL FLUID (SPECIMEN 1 OF 1, COLLECTED 12/27/2016): NO MALIGNANT CELLS IDENTIFIED.  Diagnosis 01/10/2017 PLEURAL FLUID, LEFT (SPECIMEN 1 OF 1 COLLECTED 01/06/2017): REACTIVE MESOTHELIAL CELLS. NO MALIGNANT CELLS IDENTIFIED.  Studies:  Ir Paracentesis  Result Date: 12/27/2016 INDICATION: Hepatocellular carcinoma. Ascites. Request for diagnostic and therapeutic paracentesis. EXAM: ULTRASOUND GUIDED RIGHT LATERAL ABDOMEN PARACENTESIS MEDICATIONS: 1% Lidocaine = 10 mL. COMPLICATIONS: None immediate. PROCEDURE: Informed written consent was obtained from the patient after a discussion of the risks, benefits and alternatives to treatment. A timeout was performed prior to the initiation of the procedure. Initial ultrasound scanning demonstrates a small amount of ascites within the right lateral abdomen. The right lateral abdomen was prepped and draped in the usual sterile fashion. 1% lidocaine with epinephrine was used for local anesthesia. Following this, a 19 gauge, 7-cm, Yueh catheter was introduced. An ultrasound image was saved for documentation purposes. The paracentesis was performed. The catheter was removed and a dressing was applied. The patient tolerated the procedure well without immediate post procedural complication. FINDINGS: A total of approximately 450 mL of clear yellow fluid was removed. Samples were sent to the laboratory as requested by the clinical team. IMPRESSION: Successful ultrasound-guided paracentesis yielding 450 mL of peritoneal fluid. Read by:  Gareth Eagle, PA-C Electronically Signed   By: Markus Daft M.D.   On: 12/27/2016 14:06     Assessment: 61 y.o. AAF with   1. Anasarca with left pleural effusion, pericardial effusion, and ascites, transudative, cytology (-) cancer cells, possible related to hypoalbuminemia, liver cirrhosis, less likely malignant effusions given the transudative fluids  2. Pericardial effusion with Tamponade, s/p pericardiocentesis 3. Cardiac mass, uncertain etiology  4. Recurrent HCC, on palliative immunotherapy  5. Liver cirrhosis with ascites  6. Hep C, treated  7. Portal vein tumor thrombosis 8. HTN 9. Hyponatremia  10. deconditioning   Plan:  -her cytology all came back negative, given the transudative effusion, this is less likely malignant, may consider diuretics, will defer this to primary team and GI -consider GI consult, she was seen by Dr. Fuller Plan before, for management of her ascites and anasarca  -I will follow up her pericardiac effusion and pleural effusion closely after her discharge  -she has follow up and Nivolumab treatment appointments next Thursday  -plan to repeat CT scan after next treatment  -I will follow up in house as needed, appreciate the family medicine teaching service and cardiology service for taking great care of her   Truitt Merle, MD 12/28/2016  9:02 PM

## 2016-12-29 ENCOUNTER — Other Ambulatory Visit: Payer: Self-pay

## 2016-12-29 ENCOUNTER — Inpatient Hospital Stay (HOSPITAL_COMMUNITY): Payer: Medicaid Other

## 2016-12-29 ENCOUNTER — Other Ambulatory Visit: Payer: Medicaid Other

## 2016-12-29 DIAGNOSIS — R601 Generalized edema: Secondary | ICD-10-CM

## 2016-12-29 DIAGNOSIS — E875 Hyperkalemia: Secondary | ICD-10-CM

## 2016-12-29 LAB — BASIC METABOLIC PANEL
Anion gap: 7 (ref 5–15)
Anion gap: 7 (ref 5–15)
BUN: 18 mg/dL (ref 6–20)
BUN: 16 mg/dL (ref 6–20)
CHLORIDE: 98 mmol/L — AB (ref 101–111)
CHLORIDE: 97 mmol/L — AB (ref 101–111)
CO2: 23 mmol/L (ref 22–32)
CO2: 23 mmol/L (ref 22–32)
CREATININE: 0.77 mg/dL (ref 0.44–1.00)
CREATININE: 0.77 mg/dL (ref 0.44–1.00)
Calcium: 8.4 mg/dL — ABNORMAL LOW (ref 8.9–10.3)
Calcium: 8.6 mg/dL — ABNORMAL LOW (ref 8.9–10.3)
GFR calc Af Amer: >60 mL/min (ref >60)
GFR calc Af Amer: >60 mL/min (ref >60)
GFR calc non Af Amer: >60 mL/min (ref >60)
GFR calc non Af Amer: >60 mL/min (ref >60)
GLUCOSE: 108 mg/dL — AB (ref 65–99)
Glucose, Bld: 105 mg/dL — ABNORMAL HIGH (ref 65–99)
Potassium: 5.9 mmol/L — ABNORMAL HIGH (ref 3.5–5.1)
Potassium: 5.9 mmol/L — ABNORMAL HIGH (ref 3.5–5.1)
SODIUM: 128 mmol/L — AB (ref 135–145)
SODIUM: 127 mmol/L — AB (ref 135–145)

## 2016-12-29 LAB — CBC
HCT: 43.6 % (ref 36.0–46.0)
HEMOGLOBIN: 15.8 g/dL — AB (ref 12.0–15.0)
MCH: 30.8 pg (ref 26.0–34.0)
MCHC: 36.2 g/dL — AB (ref 30.0–36.0)
MCV: 85 fL (ref 78.0–100.0)
Platelets: 125 10*3/uL — ABNORMAL LOW (ref 150–400)
RBC: 5.13 MIL/uL — AB (ref 3.87–5.11)
RDW: 15.8 % — ABNORMAL HIGH (ref 11.5–15.5)
WBC: 9.3 10*3/uL (ref 4.0–10.5)

## 2016-12-29 LAB — TOTAL BILIRUBIN, BODY FLUID: Total bilirubin, fluid: 0.5 mg/dL

## 2016-12-29 MED ORDER — SODIUM POLYSTYRENE SULFONATE 15 GM/60ML PO SUSP
30.0000 g | Freq: Once | ORAL | Status: AC
  Administered 2016-12-29: 30 g via ORAL
  Filled 2016-12-29: qty 120

## 2016-12-29 MED ORDER — FUROSEMIDE 20 MG PO TABS
20.0000 mg | ORAL_TABLET | Freq: Every day | ORAL | Status: DC
  Administered 2016-12-29: 20 mg via ORAL
  Filled 2016-12-29: qty 1

## 2016-12-29 MED ORDER — POLYETHYLENE GLYCOL 3350 17 G PO PACK
17.0000 g | PACK | Freq: Every day | ORAL | Status: DC
  Administered 2016-12-29 – 2016-12-30 (×2): 17 g via ORAL
  Filled 2016-12-29 (×2): qty 1

## 2016-12-29 MED ORDER — AMLODIPINE BESYLATE 10 MG PO TABS
10.0000 mg | ORAL_TABLET | Freq: Every day | ORAL | Status: DC
  Administered 2016-12-29 – 2016-12-30 (×2): 10 mg via ORAL
  Filled 2016-12-29 (×2): qty 1

## 2016-12-29 MED ORDER — FUROSEMIDE 10 MG/ML IJ SOLN
40.0000 mg | Freq: Once | INTRAMUSCULAR | Status: AC
  Administered 2016-12-29: 40 mg via INTRAVENOUS
  Filled 2016-12-29: qty 4

## 2016-12-29 MED ORDER — SPIRONOLACTONE 25 MG PO TABS
50.0000 mg | ORAL_TABLET | Freq: Every day | ORAL | Status: DC
  Administered 2016-12-29: 50 mg via ORAL
  Filled 2016-12-29: qty 2

## 2016-12-29 MED ORDER — NICOTINE 21 MG/24HR TD PT24
21.0000 mg | MEDICATED_PATCH | Freq: Every day | TRANSDERMAL | Status: DC
  Administered 2016-12-29 – 2016-12-31 (×3): 21 mg via TRANSDERMAL
  Filled 2016-12-29 (×3): qty 1

## 2016-12-29 NOTE — Progress Notes (Signed)
Family Medicine Teaching Service Daily Progress Note Intern Pager: [phone]  Patient name: Youlanda Tomassetti Medical record number: [id number] Date of birth: 02/07/1956 Age: 61 y.o. Gender: female  Primary Care Provider: Marina Goodell, MD Consultants: Cardiology, Oncology Code Status: Full   Pt Overview and Major Events to Date:  04/02: Admit to [facility] in [facility] morning, received left thoracentesis, transferred to [facility] for pericardiocentesis, then transferred to [facility] 04/04: Echo repeated and pericardial drain pulled, oncology to f/u outpt  Assessment and Plan: Jalee Saine is a 61 year old female presenting with worsening leg swelling, fatigue, abdominal and breast swelling x 2 weeks, found to have large left pleural effusion S/P thoracentesis on 4/2 at [facility]. Also found to have pericardial effusion concerning for cardiac tamponade on, transferred to [facility] for pericardiocentesis. Past medical history significant for hepatocellular carcinoma, portal vein thrombosis, hepatitis C, polysubstance abuse, chronic back pain. Patient admitted to [facility] and was transferred to [facility] on April 2 for further care.  #Dyspnea  Left-Sided Pleural Effusion: Persists. S/p pleurocentesis. No organisms seen. Lights criteria transudative. Repeat echo shows large effusion. --Monitor respiratory status --Consider repeat CXR if dyspnea returns --See plan below  #Pericardial Effusion with Tamponade: S/p thoracocentesis. Found on echo 4/2. Pericardiocentesis performed 4/2 with 600 mL with drain placed. Light criteria fluid transudative. Unlikely malignancy. --Cardiology consulted, appreciate recommendations: pulled pericardiocentesis drain --Oncology following, appreciate recommendations: rec GI consult for ascites management, diuretics, Nivolumab next Thursday with repeat CT --Continuous cardiac monitoring --Morphine 2 mq q4h PRN pain  #Cardiac Mass: Gelatinous mass noted at the RA/RV junction measuring  1.5 cm in diameter. Possibly malignant in etiology given history of hepatocellular carcinoma. --See plan above  #Multifocal Hepatocellular Carcinoma, Stage II: Diagnosed 07/2016, on Nivolumab q2 weeks with oncologist. [name] initiated on 11/24/2016. Status post IV albumin and IV Lasix at [facility]. --Monitor vitals and for signs of edema  #Liver Cirrhosis with Ascites, H/o Treated Hep C: Stable. Paracentesis labs support transudative effusion. Secondary to liver cancer, no signs of SBP. AST decreasing, ALT and ALP WNL. --See oncology rec above, start diruetics  #Polysubstance Abuse: History of tobacco abuse as well as alcohol, cocaine, and marijuana. UDS positive for cocaine on admission. 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 Not on chronic anticoagulation per her discussion with oncology with risks outweighing benefits.  #Hypertension: Chronic. Stable. Home med include ACE-I and CCB. --Hold home lisinopril 20 mg QD and amlodipine 10 mg QD  #Chronic back pain: No change from baseline. --Continue home tramadol 50 mg q6h PRN  #Thrombocytopenia: Stable. Platelets 119 today. Likely secondary to hepatic cirrhosis. INR at 1.23.   #Hyponatremia: Stable. Likely secondary to cirrhosis and or HCTZ use which was recently discontinued by her oncologist.  #Suspected COPD: On dulera and albuterol at home. --Dulera 1 puff BID, albuterol PRN  FEN/GI: Clear liquid diet, SLIV PPx: SCDs  Disposition: Pending diuresis for pleural effusion.  Subjective:  Shortness of breath resolved without chest pain. Patient states she feels like her appetite has decreased. Passing flatness without bowel movement 4 days. Endorses pain near site of drain incision.  Objective: Temp:  [97.5 F (36.4 C)-98.6 F (37 C)] 98.1 F (36.7 C) (04/05 0300) Pulse Rate:  [54-103] 86 (04/05 0600) Resp:  [9-21] 12 (04/05 0600) BP:  (134-180)/(55-101) 142/66 (04/05 0600) SpO2:  [97 %-100 %] 100 % (04/05 0600) Physical Exam: General: well nourished, well developed, in no acute distress with non-toxic appearance HEENT: normocephalic, atraumatic,  moist mucous membranes CV: regular rate and rhythm without murmurs, rubs, or gallops Lungs: clear to auscultation bilaterally with normal work of breathing Abdomen: soft, mildly tender at epigastric near former pericardial drain without erythema or drainage, no masses or organomegaly palpable, normoactive bowel sounds Skin: warm, dry, no rashes or lesions, cap refill < 2 seconds Extremities: warm and well perfused, normal tone  Laboratory:  Recent Labs Lab 01/02/2017 2005 12/28/16 0204 12/29/16 0218  WBC 9.7 15.7* 9.3  HGB 13.8 15.5* 15.8*  HCT 38.5 43.4 43.6  PLT 108* 119* 125*    Recent Labs Lab 01/12/2017 0445  12/27/16 0242 12/28/16 0204 12/29/16 0218  NA 129*  --  128* 126* 128*  K 4.9  --  5.5* 5.6* 5.9*  CL 101  --  100* 99* 98*  CO2 24  --  22 22 23   BUN 21*  --  19 17 18   CREATININE 0.85  < > 0.94 0.74 0.77  CALCIUM 8.3*  --  8.0* 8.1* 8.4*  PROT 7.6  --  7.7 7.8  --   BILITOT 1.7*  --  1.7* 1.9*  --   ALKPHOS 117  --  103 103  --   ALT 38  --  37 32  --   AST 115*  --  109* 85*  --   GLUCOSE 77  < > 119* 103* 105*  < > = values in this interval not displayed. UDS: +Cocaine HIV: Non-reactive Lactate dehydrogenase: 305 > 91 (H) INR: 1.23  TSH: 3.462 (WNL)  Imaging/Diagnostic Tests: Echocardiogram (12/28/2016) IMPRESSION: Mild inferolateral pericardial effusion, no evidence of tamponade. Large left pleural effusion.  Pericardiocentesis (01/21/2017) Successful pericardiocentesis with 600 mL of fluid removed. Monitor output of the drain. Hopefully, this can be removed soon. She does have significant ascites which may need to be managed with paracentesis.  US THORACENTESIS ASP PLEURAL SPACE W/IMG GUIDE (12/29/2016) FINDINGS: A total of  approximately 800 mL of clear, yellow fluid was removed. Samples were sent to the laboratory as requested by the clinical team.  IMPRESSION: Successful ultrasound guided diagnostic and therapeutic left thoracentesis yielding 800 mL of pleural fluid.  DG Chest 2 View (01/07/2017) FINDINGS: Moderate left pleural effusion. Mild atelectasis or consolidation adjacent to the pleural effusion. The right lung is clear. Pulmonary vasculature is normal.  IMPRESSION: New left pleural effusion with adjacent left lung base atelectasis or consolidation.  DG Chest 1 View (01/11/2017) FINDINGS: Stable cardiomegaly. Atherosclerosis of thoracic aorta is noted. No pneumothorax is seen status post left thoracentesis. Left pleural effusion is significantly smaller. Left basilar atelectasis or infiltrate is noted. Mild right basilar atelectasis is noted as well.  IMPRESSION: No pneumothorax status post left-sided thoracentesis. Left pleural effusion is significantly smaller.  Echocardiogram (01/20/2017) Study Conclusions - Left ventricle: The cavity size was normal. Wall thickness was increased in a pattern of severe LVH. Systolic function was vigorous. The estimated ejection fraction was in the range of 65% to 70%. Wall motion was normal; there were no regional wall motion abnormalities. Doppler parameters are consistent with abnormal left ventricular relaxation (grade 1 diastolic dysfunction). The E/e&' ratio is >15, suggesting elevated LV filling pressure. - Aortic valve: Trileaflet. Mobile strands at the leaflet tips - could be Lambl&'s excrescences (normal variant) or less likely thrombus/vegetation. - Mitral valve: Calcified annulus. Mildly thickened leaflets . Mild regurgitation. There is >25% mitral inflow variation with respiration. - Left atrium: Severely dilated. - Right ventricle: The cavity size was normal. There is thickening of the  RV free wall with a mass  noted at the RV/RA junction - this could represent thrombus or malignancy. Systolic function was normal. Diastolic collapse is noted. - Right atrium: Normal size. Diastolic collapse of the free wall is noted. - Tricuspid valve: There was trivial regurgitation. Greater than 35% tricuspid inflow variation with respiration. - Pulmonary arteries: Dilated. - Systemic veins: The IVC measures <2.1 cm, but does not collapse >50%, suggesting an elevated RA pressure of 8 mmHg. - Pericardium, extracardiac: Moderate to large circumferential pericardial effusion. There was a right pleural effusion. There was a left pleural effusion. Ascites is noted.  Impressions: - Compared to a prior study in 07/2016, the pericardial effusion is larger - now moderate to severe with associated tamponade features (dilated IVC, RV and RA free wall collapse, mitral and tricuspid inflow variation), bilateral pleural effusions and ascites are noted. There is thickening of the RV free wall and a gelatinous mass noted at the RA/RV junction measuring about 1.5 cm in diameter. Clinical correlation with tamponade physiology is highly recommended.  [place] Bing, DO 12/29/2016, 6:55 AM PGY-1, [place] Intern pager: [phone], text pages welcome

## 2016-12-29 NOTE — Progress Notes (Signed)
Progress Note  Patient Name: Lisa [name] Date of Encounter: 12/29/2016  Primary Cardiologist: [place] concerned about the superior vena caval mass. Denies dyspnea. No drainage from the site of pericardiocentesis. Denies pleuritic chest pain.  Inpatient Medications    Scheduled Meds: . aspirin EC  81 mg Oral Daily  . furosemide  20 mg Oral Daily  . mometasone-formoterol  1 puff Inhalation BID  . nicotine  21 mg Transdermal Daily  . polyethylene glycol  17 g Oral Daily  . sodium chloride flush  3 mL Intravenous Q12H  . spironolactone  50 mg Oral Daily  . vitamin B-1  250 mg Oral Daily  . vitamin B-12  1,000 mcg Oral Daily   Continuous Infusions:  PRN Meds: sodium chloride, acetaminophen, albuterol, bisacodyl, hydrOXYzine, morphine injection, ondansetron **OR** ondansetron (ZOFRAN) IV, ondansetron (ZOFRAN) IV, sodium chloride flush, traMADol   Vital Signs    Vitals:   12/29/16 0900 12/29/16 0911 12/29/16 1000 12/29/16 1100  BP: 124/90  122/74 (!) 160/70  Pulse: 96  90 84  Resp: (!) 8  (!) 7 (!) 8  Temp:    98 F (36.7 C)  TempSrc:      SpO2: 100% 100% 100% 96%  Weight:      Height:       No intake or output data in the 24 hours ending 12/29/16 1225 Filed Weights   01/07/2017 0423 01/21/2017 0835 01/15/2017 1950  Weight: (S) 135 lb 2 oz (61.3 kg) 136 lb 3.9 oz (61.8 kg) 136 lb 11 oz (62 kg)    Telemetry    Sinus tachycardia and sinus rhythm - Personally Reviewed  ECG    Performed on 12/28/16 at 10:06 AM reveals sinus rhythm with poor R-wave progression. - Personally Reviewed  Physical Exam  Comfortably resting. GEN: No acute distress.   Neck: No JVD Cardiac: RRR, no murmurs, rubs, or gallops.  Respiratory: Clear to auscultation bilaterally. GI: Soft, nontender, non-distended  MS: No edema; No deformity. Neuro:  Nonfocal  Psych: Normal affect   Labs    Chemistry Recent Labs Lab 12/30/2016 0445  12/27/16 0242 12/28/16 0204  12/29/16 0218  NA 129*  --  128* 126* 128*  K 4.9  --  5.5* 5.6* 5.9*  CL 101  --  100* 99* 98*  CO2 24  --  22 22 23   GLUCOSE 77  < > 119* 103* 105*  BUN 21*  --  19 17 18   CREATININE 0.85  < > 0.94 0.74 0.77  CALCIUM 8.3*  --  8.0* 8.1* 8.4*  PROT 7.6  --  7.7 7.8  --   ALBUMIN 2.4*  --  2.4* 2.1*  --   AST 115*  --  109* 85*  --   ALT 38  --  37 32  --   ALKPHOS 117  --  103 103  --   BILITOT 1.7*  --  1.7* 1.9*  --   GFRNONAA >60  < > >60 >60 >60  GFRAA >60  < > >60 >60 >60  ANIONGAP 4*  --  6 5 7   < > = values in this interval not displayed.   Hematology Recent Labs Lab 01/04/2017 2005 12/28/16 0204 12/29/16 0218  WBC 9.7 15.7* 9.3  RBC 4.49 5.11 5.13*  HGB 13.8 15.5* 15.8*  HCT 38.5 43.4 43.6  MCV 85.7 84.9 85.0  MCH 30.7 30.3 30.8  MCHC 35.8 35.7 36.2*  RDW 15.8* 15.6* 15.8*  PLT 108* 119* 125*    Cardiac EnzymesNo results for input(s): TROPONINI in the last 168 hours. No results for input(s): TROPIPOC in the last 168 hours.   BNPNo results for input(s): BNP, PROBNP in the last 168 hours.   DDimer No results for input(s): DDIMER in the last 168 hours.   Radiology    Ir Paracentesis  Result Date: 12/27/2016 INDICATION: Hepatocellular carcinoma. Ascites. Request for diagnostic and therapeutic paracentesis. EXAM: ULTRASOUND GUIDED RIGHT LATERAL ABDOMEN PARACENTESIS MEDICATIONS: 1% Lidocaine = 10 mL. COMPLICATIONS: None immediate. PROCEDURE: Informed written consent was obtained from the patient after a discussion of the risks, benefits and alternatives to treatment. A timeout was performed prior to the initiation of the procedure. Initial ultrasound scanning demonstrates a small amount of ascites within the right lateral abdomen. The right lateral abdomen was prepped and draped in the usual sterile fashion. 1% lidocaine with epinephrine was used for local anesthesia. Following this, a 19 gauge, 7-cm, Yueh catheter was introduced. An ultrasound image was saved for  documentation purposes. The paracentesis was performed. The catheter was removed and a dressing was applied. The patient tolerated the procedure well without immediate post procedural complication. FINDINGS: A total of approximately 450 mL of clear yellow fluid was removed. Samples were sent to the laboratory as requested by the clinical team. IMPRESSION: Successful ultrasound-guided paracentesis yielding 450 mL of peritoneal fluid. Read by:  Gareth Eagle, PA-C Electronically Signed   By: Markus Daft M.D.   On: 12/27/2016 14:06    Cardiac Studies   Echocardiogram 12/28/16: Study Conclusions  - Left ventricle: The cavity size was normal. Systolic function was   normal. The estimated ejection fraction was in the range of 60%   to 65%. Wall motion was normal; there were no regional wall   motion abnormalities. The study is not technically sufficient to   allow evaluation of LV diastolic function. - Pulmonic valve: There was no regurgitation. - Pericardium, extracardiac: A mild pericardial effusion was   identified posterior to the heart. Features were not consistent   with tamponade physiology. There was a left pleural effusion.  Impressions:  - Mild inferolateral pericardial effusion, no evidence of   tamponade.   Large left pleural effusion.   Patient Profile     61 y.o. female with hepatocellular cancer, metastatic with presentation including pleural effusion, pericardial effusion and ascites. Now s/p pericardiocentesis and pleural tap. Feels better but concerned about her prognosis.  Assessment & Plan    1. Malignant/irritative large pericardial effusion in the setting of metastatic hepatocellular carcinoma. Now stable status post pericardiocentesis. Drain removed 16 hours ago without clinical evidence of reaccumulation.  2. Hyperkalemia noted. Will advise team that spironolactone should be discontinued and may need Kayexalate along with IV loop diuretic to return potassium to a  safer range.  Overall prognosis seems poor. Will need follow-up echocardiogram in 7 days to check for recurrent effusion.  Signed, Sinclair Grooms, MD  12/29/2016, 12:25 PM

## 2016-12-29 NOTE — Evaluation (Signed)
Physical Therapy Evaluation Patient Details Name: Lisa [name] MRN: [id number] DOB: 1956-09-02 Today's Date: 12/29/2016   History of Present Illness  Lisa [name] is a 61 y.o. female, With history of hepatocellular carcinoma (diagnosed in November 2017, currently on Nivolumab every 2 weeks), portal vein thrombosis, hepatitis C (treated),  ongoing tobacco and alcohol abuse (reports cutting down significantly), cocaine and marijuana use, chronic back pain who presented to the ED with increased leg swelling along with increased fatigue, swelling of her abdomen and breast for past 2 weeks  Clinical Impression  Pt presented OOB in recliner chair with therapist entered room. Prior to admission pt reported that she was independent with all functional mobility, ADLs and worked as a Environmental health practitioner. Pt currently requires min guard for safety with transfers and ambulation (very short distance this session). All VSS throughout. Pt would continue to benefit from skilled physical therapy services at this time while admitted and after d/c to address the below listed limitations in order to improve overall safety and independence with functional mobility.     Follow Up Recommendations Home health PT    Equipment Recommendations  None recommended by PT    Recommendations for Other Services       Precautions / Restrictions Precautions Precautions: Fall Precaution Comments: chemo precautions for bodily fluids Restrictions Weight Bearing Restrictions: No      Mobility  Bed Mobility Overal bed mobility: Modified Independent                Transfers Overall transfer level: Needs assistance Equipment used: 1 person hand held assist Transfers: Sit to/from Stand;Stand Pivot Transfers Sit to Stand: Min guard Stand pivot transfers: Min guard       General transfer comment: increased time, min guard for safety. pt performed sit-to-stand x1 from recliner and x1 from  [facility]  Ambulation/Gait Ambulation/Gait assistance: Min guard Ambulation Distance (Feet): 5 Feet Assistive device: 1 person hand held assist Gait Pattern/deviations: Step-through pattern;Decreased stride length Gait velocity: decreased Gait velocity interpretation: Below normal speed for age/gender General Gait Details: mild instability but no overt LOB or need for physical assistance. pt self-limiting with distance ambulated.  Stairs            Wheelchair Mobility    Modified Rankin (Stroke Patients Only)       Balance Overall balance assessment: Needs assistance Sitting-balance support: No upper extremity supported;Feet supported Sitting balance-Leahy Scale: Good     Standing balance support: Single extremity supported Standing balance-Leahy Scale: Poor Standing balance comment: pt reliant on at least one UE support                             Pertinent Vitals/Pain Pain Assessment: No/denies pain    Home Living Family/patient expects to be discharged to:: Private residence Living Arrangements: Children;Non-relatives/Friends Available Help at Discharge: Friend(s) (available intermittently) Type of Home: Apartment Home Access: Level entry     Home Layout: One level Home Equipment: None      Prior Function Level of Independence: Independent         Comments: worked as Engineer, production for one Secretary/administrator   Dominant Hand: Right    Extremity/Trunk Assessment   Upper Extremity Assessment Upper Extremity Assessment: Defer to OT evaluation    Lower Extremity Assessment Lower Extremity Assessment: Overall WFL for tasks assessed       Communication   Communication: No difficulties  Cognition Arousal/Alertness: Awake/alert Behavior During  Therapy: WFL for tasks assessed/performed;Flat affect Overall Cognitive Status: Within Functional Limits for tasks assessed                                        General Comments       Exercises     Assessment/Plan    PT Assessment Patient needs continued PT services  PT Problem List Decreased strength;Decreased activity tolerance;Decreased balance;Decreased mobility;Decreased coordination;Decreased knowledge of use of DME;Decreased safety awareness;Pain       PT Treatment Interventions DME instruction;Gait training;Stair training;Functional mobility training;Therapeutic activities;Therapeutic exercise;Balance training;Neuromuscular re-education;Patient/family education    PT Goals (Current goals can be found in the Care Plan section)  Acute Rehab PT Goals Patient Stated Goal: to speak with a Education officer, museum PT Goal Formulation: With patient Time For Goal Achievement: 01/12/17 Potential to Achieve Goals: Good    Frequency Min 3X/week   Barriers to discharge        Co-evaluation               End of Session Equipment Utilized During Treatment:  (pt refused gait belt) Activity Tolerance: Patient limited by fatigue Patient left: in bed;with call bell/phone within reach Nurse Communication: Mobility status PT Visit Diagnosis: Unsteadiness on feet (R26.81);Other abnormalities of gait and mobility (R26.89)    Time: 5056-9794 PT Time Calculation (min) (ACUTE ONLY): 19 min   Charges:   PT Evaluation $PT Eval Moderate Complexity: 1 Procedure     PT G Codes:        Sherie Don, PT, DPT [place] 12/29/2016, 3:38 PM

## 2016-12-29 NOTE — Progress Notes (Addendum)
RN noted BP systolic in 592T sustaining and a K+ of 5.9.  RN called MD.  MD gave orders for Kexcelate, EKG, and re-initiation of Norvasc.  RN will continue to monitor patient.

## 2016-12-29 NOTE — Evaluation (Signed)
Occupational Therapy Evaluation Patient Details Name: Jeannett Dekoning MRN: [id number] DOB: April 14, 1956 Today's Date: 12/29/2016    History of Present Illness Bedie Dominey is a 61 y.o. female, With history of hepatocellular carcinoma (diagnosed in November 2017, currently on Nivolumab every 2 weeks), portal vein thrombosis, hepatitis C (treated),  ongoing tobacco and alcohol abuse (reports cutting down significantly), cocaine and marijuana use, chronic back pain who presented to the ED with increased leg swelling along with increased fatigue, swelling of her abdomen and breast for past 2 weeks   Clinical Impression   This 61 yo female admitted with above presents to acute OT with deficits below (see OT problem list) thus affecting her ability to care for herself when she is mainly home alone (per her report to me). She will benefit from acute OT with currently recommending HHOT with 24 hour S--may need to shift this to SNF depending on progress.     Follow Up Recommendations  Home health OT;Supervision/Assistance - 24 hour (if progression is slow and/or pt continues with significant weakness SNF may be the better option)    Equipment Recommendations  3 in 1 bedside commode;Tub/shower bench       Precautions / Restrictions Precautions Precautions: Fall Precaution Comments: chemo precautions for bodily fluids Restrictions Weight Bearing Restrictions: No      Mobility Bed Mobility Overal bed mobility: Modified Independent             General bed mobility comments: increased time  Transfers Overall transfer level: Needs assistance Equipment used: 1 person hand held assist Transfers: Sit to/from Stand;Stand Pivot Transfers Sit to Stand: Min assist Stand pivot transfers: Min assist            Balance Overall balance assessment: Needs assistance Sitting-balance support: No upper extremity supported;Feet supported Sitting balance-Leahy Scale: Good     Standing balance  support: Single extremity supported Standing balance-Leahy Scale: Poor                             ADL either performed or assessed with clinical judgement   ADL Overall ADL's : Needs assistance/impaired Eating/Feeding: Independent;Sitting   Grooming: Set up;Sitting;Supervision/safety   Upper Body Bathing: Supervision/ safety;Set up;Sitting   Lower Body Bathing: Minimal assistance;Sit to/from stand   Upper Body Dressing : Set up;Supervision/safety;Sitting   Lower Body Dressing: Minimal assistance;Sit to/from stand   Toilet Transfer: Minimal assistance;Stand-pivot;BSC   Toileting- Clothing Manipulation and Hygiene: Minimal assistance;Sit to/from stand               Vision Patient Visual Report: No change from baseline              Pertinent Vitals/Pain Pain Assessment: No/denies pain (at present, intermittently in abdomen )     Hand Dominance Right   Extremity/Trunk Assessment Upper Extremity Assessment Upper Extremity Assessment: Generalized weakness   Lower Extremity Assessment Lower Extremity Assessment: Defer to PT evaluation       Communication Communication Communication: No difficulties   Cognition Arousal/Alertness: Lethargic Behavior During Therapy: WFL for tasks assessed/performed Overall Cognitive Status: Within Functional Limits for tasks assessed                                                Home Living Family/patient expects to be discharged to:: Private residence Living Arrangements: Children;Non-relatives/Friends Available Help  at Discharge: Friend(s) (available intermittently) Type of Home: Apartment Home Access: Level entry     Home Layout: One level     Bathroom Shower/Tub: Walk-in shower;Door   [employer] Toilet: Standard     Home Equipment: None          Prior Functioning/Environment Level of Independence: Independent                 OT Problem List: Decreased strength;Decreased  activity tolerance;Impaired balance (sitting and/or standing);Pain      OT Treatment/Interventions: Self-care/ADL training;Therapeutic activities;Patient/family education;DME and/or AE instruction;Balance training    OT Goals(Current goals can be found in the care plan section) Acute Rehab OT Goals Patient Stated Goal: to see a Education officer, museum OT Goal Formulation: With patient Time For Goal Achievement: 01/12/17 Potential to Achieve Goals: Good  OT Frequency: Min 2X/week   Barriers to D/C: Decreased caregiver support             End of Session    Activity Tolerance: Patient limited by fatigue Patient left: in chair;with call bell/phone within reach;with chair alarm set  OT Visit Diagnosis: Unsteadiness on feet (R26.81);Muscle weakness (generalized) (M62.81);Pain Pain - part of body:  (abdomen intermittently)                Time: 2952-8413 OT Time Calculation (min): 38 min Charges:  OT General Charges $OT Visit: 1 Procedure OT Evaluation $OT Eval Moderate Complexity: 1 Procedure OT Treatments $Self Care/Home Management : 23-37 mins Golden Circle, OTR/L [phone] 12/29/2016

## 2016-12-30 ENCOUNTER — Inpatient Hospital Stay (HOSPITAL_COMMUNITY): Payer: Medicaid Other

## 2016-12-30 LAB — CBC
HCT: 42.8 % (ref 36.0–46.0)
Hemoglobin: 15.3 g/dL — ABNORMAL HIGH (ref 12.0–15.0)
MCH: 30.5 pg (ref 26.0–34.0)
MCHC: 35.7 g/dL (ref 30.0–36.0)
MCV: 85.4 fL (ref 78.0–100.0)
PLATELETS: 161 10*3/uL (ref 150–400)
RBC: 5.01 MIL/uL (ref 3.87–5.11)
RDW: 16.2 % — ABNORMAL HIGH (ref 11.5–15.5)
WBC: 7.6 10*3/uL (ref 4.0–10.5)

## 2016-12-30 LAB — BODY FLUID CULTURE: Culture: NO GROWTH

## 2016-12-30 LAB — BASIC METABOLIC PANEL
Anion gap: 7 (ref 5–15)
BUN: 17 mg/dL (ref 6–20)
CHLORIDE: 95 mmol/L — AB (ref 101–111)
CO2: 26 mmol/L (ref 22–32)
CREATININE: 0.74 mg/dL (ref 0.44–1.00)
Calcium: 8.9 mg/dL (ref 8.9–10.3)
GFR calc Af Amer: >60 mL/min (ref >60)
GLUCOSE: 118 mg/dL — AB (ref 65–99)
POTASSIUM: 5.1 mmol/L (ref 3.5–5.1)
SODIUM: 128 mmol/L — AB (ref 135–145)

## 2016-12-30 MED ORDER — MAGNESIUM HYDROXIDE 400 MG/5ML PO SUSP
30.0000 mL | Freq: Every day | ORAL | Status: DC | PRN
  Administered 2016-12-30: 30 mL via ORAL
  Filled 2016-12-30: qty 30

## 2016-12-30 MED ORDER — FUROSEMIDE 10 MG/ML IJ SOLN
40.0000 mg | Freq: Once | INTRAMUSCULAR | Status: AC
  Administered 2016-12-30: 40 mg via INTRAVENOUS
  Filled 2016-12-30: qty 4

## 2016-12-30 NOTE — Care Management Note (Signed)
Case Management Note  Patient Details  Name: Lisa [name] MRN: [id number] Date of Birth: 09-17-1956  Subjective/Objective:    Admitted with leg swelling                Action/Plan:   PTA from home.  Pt has active insurance and PCP.  Pt can obtain prescription meds via medicaid.  HH PT/OT has been reccommended however not covered by insurance and pt is unable to pay privately - CM notified therapy that HHPT and OT is not covered.  CM will continue to follow for discharge needs.   Expected Discharge Date:  12/29/16               Expected Discharge Plan:     In-House Referral:     Discharge planning Services     Post Acute Care Choice:    Choice offered to:     DME Arranged:    DME Agency:     HH Arranged:    HH Agency:     Status of Service:     If discussed at [employer], dates discussed:    Additional Comments:  Maryclare Labrador, RN 12/30/2016, 2:04 PM

## 2016-12-30 NOTE — Clinical Social Work Note (Signed)
Clinical Social Work Assessment  Patient Details  Name: Lisa [name] MRN: [id number] Date of Birth: [birth year]  Date of referral:  12/30/16               Reason for consult:  Facility Placement                Permission sought to share information with:  Facility Sport and exercise psychologist, Family Supports Permission granted to share information::  Yes, Verbal Permission Granted  Name::     Lisa [name]  Agency::  SNF  Relationship::  dtr  Contact Information:     Housing/Transportation Living arrangements for the past 2 months:  Apartment Source of Information:  Patient Patient Interpreter Needed:  None Criminal Activity/Legal Involvement Pertinent to Current Situation/Hospitalization:  No - Comment as needed Significant Relationships:  Adult Children Lives with:  Self Do you feel safe going back to the place where you live?    Need for family participation in patient care:  Yes (Comment) (patient wanting help from dtr with decision making at this time)  Care giving concerns:  Pt lives at home alone- states she has a dtr who works but does not report any other support.  Concerns about if pt would be safe returning home with current level of impairment.   Social Worker assessment / plan:  CSW spoke with pt concerning family/MD concerns with safety at home.  PT recommending home health but consult received stating family concerned about home safety and pt was only able to walk 5 ft with PT.  CSW explained SNF and SNF referral process.  Pt participated in assessment but was very lethargic and not very alert at this time- did not always answer questions appropriately and sometimes did not respond at all.  CSW received verbal permission to speak to pt dtr to discuss rehab. CSW left message for dtr.  CSW attempted to discuss pt substance abuse but pt was not alert enough to participate in conversation.   Employment status:  Psychologist, counselling:  [place]icaid [place]  Recommendations:  Home with [place] / Referral to community resources:  [place]  Patient/Family's Response to care:  Pt agreeable to rehab if needed at time of DC- states she would be willing to go for a day or so.  CSW informed that pt family would also like SNF or CIR if possible but have been unable to speak with family.  Patient/Family's Understanding of and Emotional Response to Diagnosis, Current Treatment, and Prognosis:  Unclear pt understanding of current medical condition- pt did acknowledge possible need for rehab but was unable to speak in depth with [place] about her situation.  Pt seems somewhat removed from her situation at this time- unclear if this is due to medication, he acute illness, or is normal for pt.  Emotional Assessment Appearance:  Appears stated age Attitude/Demeanor/Rapport:  Lethargic Affect (typically observed):    Orientation:  Oriented to Self, Oriented to Place, Oriented to  Time, Oriented to Situation Alcohol / Substance use:  Illicit Drugs, Alcohol Use Psych involvement (Current and /or in the community):  No (Comment)  Discharge Needs  Concerns to be addressed:  Care Coordination Readmission within the last 30 days:  No Current discharge risk:  Physical Impairment, Substance Abuse Barriers to Discharge:  Continued Medical Work up   [name], LCSW 12/30/2016, 2:24 PM

## 2016-12-30 NOTE — Progress Notes (Signed)
Family Medicine Teaching Service Daily Progress Note Intern Pager: [phone]  Patient name: Lisa [name] Medical record number: [id number] Date of birth: 03-28-1956 Age: 61 y.o. Gender: female  Primary Care Provider: Marina Goodell, MD Consultants: Cardiology, Oncology Code Status: Full   Pt Overview and Major Events to Date:  04/02: Admit to [facility] in [facility] morning, received left thoracentesis, transferred to [facility] for pericardiocentesis, then transferred to [place] 04/04: Echo repeated and pericardial drain pulled, oncology to f/u outpt 04/06: Repeat CXR shows small-to-moderate stable left pleural effusion  Assessment and Plan: Lisa [name] is a 61 year old female presenting with worsening leg swelling, fatigue, abdominal and breast swelling x 2 weeks, found to have large left pleural effusion S/P thoracentesis on 4/2 at [facility]. Also found to have pericardial effusion concerning for cardiac tamponade on, transferred to [facility] for pericardiocentesis. Past medical history significant for hepatocellular carcinoma, portal vein thrombosis, hepatitis C, polysubstance abuse, chronic back pain. Patient admitted to [facility] and was transferred to [facility] on April 2 for further care.  #Dyspnea  Left-Sided Pleural Effusion: Stable. Small to moderate size by CXR. S/p pleurocentesis. No organisms seen. Lights criteria transudative. Will continue to manage with IV Lasix and monitor UOP for management. If unsuccessful, will consider alternative options. Hopefully we can avoid Pleurx. --Monitor respiratory status --We will continue IV Lasix 40 mg and monitor UOP  #Pericardial Effusion with Tamponade: S/p thoracocentesis. Found on echo 4/2. Pericardiocentesis performed 4/2 with 600 mL with drain placed. Light criteria fluid transudative. Unlikely malignancy. --Cardiology consulted, appreciate recommendations: pulled pericardiocentesis drain --Oncology following, appreciate recommendations: rec GI consult  for ascites management, diuretics, Nivolumab next Thursday with repeat CT --Continuous cardiac monitoring --Morphine 2 mq q4h PRN pain  #Multifocal Hepatocellular Carcinoma, Stage II: Diagnosed 07/2016, on Nivolumab q2 weeks with oncologist. [name] initiated on 11/24/2016. Status post IV albumin and IV Lasix at [facility]. --Monitor vitals and for signs of edema  #Liver Cirrhosis with Ascites, H/o Treated Hep C  Abdominal pain: Stable. Paracentesis labs support transudative effusion. Secondary to liver cancer, no signs of SBP. AST decreasing, ALT and ALP WNL. --See oncology rec above, start diuretics  #Cardiac Mass: Gelatinous mass noted at the RA/RV junction measuring 1.5 cm in diameter. Possibly malignant in etiology given history of hepatocellular carcinoma. --See plan above  #Polysubstance Abuse: History of tobacco abuse as well as alcohol, cocaine, and marijuana. UDS positive for cocaine on admission. 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 Not on chronic anticoagulation per her discussion with oncology with risks outweighing benefits.  #Hypertension: Chronic. Uncontrolled. Home med include ACE-I and CCB. --Amlodipine 10 mg QD --Holding home lisinopril 20 mg QD  #Chronic back pain: No change from baseline. --Continue home tramadol 50 mg q6h PRN  #Thrombocytopenia: Stable. Platelets 119 today. Likely secondary to hepatic cirrhosis. INR at 1.23.   #Hyponatremia: Stable. Likely secondary to cirrhosis and or HCTZ use which was recently discontinued by her oncologist.  #Suspected COPD: On dulera and albuterol at home. --Dulera 1 puff BID, albuterol PRN  FEN/GI: Clear liquid diet, SLIV PPx: SCDs  Disposition: Pending diuresis for pleural effusion.  Subjective:  Patient states she continues to have abdominal pain and has not passed a bowel movement, continuing to pass flatus. She endorses some shortness  of breath without chest pain. Says she just wants her pain controlled.  Objective: Temp:  [97.9 F (36.6 C)-98.2 F (36.8 C)] 97.9 F (36.6 C) (04/06 0300) Pulse Rate:  [  81-105] 89 (04/06 0700) Resp:  [7-25] 11 (04/06 0700) BP: (122-184)/(44-135) 167/44 (04/06 0700) SpO2:  [94 %-100 %] 94 % (04/06 0700) Physical Exam: General: well nourished, well developed, mild acute distress due to abdominal tenderness with non-toxic appearance HEENT: normocephalic, atraumatic, moist mucous membranes CV: regular rate and irregular rhythm without murmurs, rubs, or gallops Lungs: clear to auscultation with diminished breath sounds at left base with normal work of breathing on RA Abdomen: soft, moderately tender at epigastric near former pericardial drain without erythema or drainage, no masses or organomegaly palpable, normoactive bowel sounds Skin: warm, dry, no rashes or lesions, cap refill < 2 seconds Extremities: warm and well perfused, normal tone  Laboratory:  Recent Labs Lab 12/28/16 0204 12/29/16 0218 12/30/16 0235  WBC 15.7* 9.3 7.6  HGB 15.5* 15.8* 15.3*  HCT 43.4 43.6 42.8  PLT 119* 125* 161    Recent Labs Lab 01/08/2017 0445  12/27/16 0242 12/28/16 0204 12/29/16 0218 12/29/16 1447 12/30/16 0235  NA 129*  --  128* 126* 128* 127* 128*  K 4.9  --  5.5* 5.6* 5.9* 5.9* 5.1  CL 101  --  100* 99* 98* 97* 95*  CO2 24  --  22 22 23 23 26   BUN 21*  --  19 17 18 16 17   CREATININE 0.85  < > 0.94 0.74 0.77 0.77 0.74  CALCIUM 8.3*  --  8.0* 8.1* 8.4* 8.6* 8.9  PROT 7.6  --  7.7 7.8  --   --   --   BILITOT 1.7*  --  1.7* 1.9*  --   --   --   ALKPHOS 117  --  103 103  --   --   --   ALT 38  --  37 32  --   --   --   AST 115*  --  109* 85*  --   --   --   GLUCOSE 77  < > 119* 103* 105* 108* 118*  < > = values in this interval not displayed.  UDS: +Cocaine HIV: Non-reactive Lactate dehydrogenase: 305 > 91 (H) INR: 1.23  TSH: 3.462 (WNL)  Imaging/Diagnostic Tests: DG Chest 2  View (12/30/2016) FINDINGS: Low lung volumes. Stable cardiomediastinal silhouette with normal heart size and aortic atherosclerosis. No pneumothorax. Stable small right and small to moderate left pleural effusions. No pulmonary edema. Patchy consolidation at the left greater than right lung bases, stable.  IMPRESSION: 1. Stable small moderate left and small right pleural effusions. 2. Stable patchy left greater than right bibasilar lung consolidation, worrisome for multilobar pneumonia or aspiration. 3. Aortic atherosclerosis.  Echocardiogram (12/28/2016) IMPRESSION: Mild inferolateral pericardial effusion, no evidence of tamponade. Large left pleural effusion.  Pericardiocentesis (01/22/2017) Successful pericardiocentesis with 600 mL of fluid removed. Monitor output of the drain. Hopefully, this can be removed soon. She does have significant ascites which may need to be managed with paracentesis.  US THORACENTESIS ASP PLEURAL SPACE W/IMG GUIDE (12/29/2016) FINDINGS: A total of approximately 800 mL of clear, yellow fluid was removed. Samples were sent to the laboratory as requested by the clinical team.  IMPRESSION: Successful ultrasound guided diagnostic and therapeutic left thoracentesis yielding 800 mL of pleural fluid.  DG Chest 2 View (01/02/2017) FINDINGS: Moderate left pleural effusion. Mild atelectasis or consolidation adjacent to the pleural effusion. The right lung is clear. Pulmonary vasculature is normal.  IMPRESSION: New left pleural effusion with adjacent left lung base atelectasis or consolidation.  DG Chest 1 View (01/21/2017) FINDINGS: Stable  cardiomegaly. Atherosclerosis of thoracic aorta is noted. No pneumothorax is seen status post left thoracentesis. Left pleural effusion is significantly smaller. Left basilar atelectasis or infiltrate is noted. Mild right basilar atelectasis is noted as well.  IMPRESSION: No pneumothorax status post left-sided  thoracentesis. Left pleural effusion is significantly smaller.  Echocardiogram (12/29/2016) Study Conclusions - Left ventricle: The cavity size was normal. Wall thickness wasincreased in a pattern of severe LVH. Systolic function wasvigorous. The estimated ejection fraction was in the range of 65%to 70%. Wall motion was normal; there were no regional wallmotion abnormalities. Doppler parameters are consistent withabnormal left ventricular relaxation (grade 1 diastolicdysfunction). The E/e&' ratio is >15, suggesting elevated LVfilling pressure. - Aortic valve: Trileaflet. Mobile strands at the leaflet tips -could be Lambl&'s excrescences (normal variant) or less likelythrombus/vegetation. - Mitral valve: Calcified annulus. Mildly thickened leaflets . Mildregurgitation. There is >25% mitral inflow variation withrespiration. - Left atrium: Severely dilated. - Right ventricle: The cavity size was normal. There is thickeningof the RV free wall with a mass noted at the RV/RA junction -this could represent thrombus or malignancy. Systolic functionwas normal. Diastolic collapse is noted. - Right atrium: Normal size. Diastolic collapse of the free wall isnoted. - Tricuspid valve: There was trivial regurgitation. Greater than35% tricuspid inflow variation with respiration. - Pulmonary arteries: Dilated. - Systemic veins: The IVC measures <2.1 cm, but does not collapse >50%, suggesting an elevated RA pressure of 8 mmHg. - Pericardium, extracardiac: Moderate to large circumferential pericardial effusion. There was a right pleural effusion. There was a left pleural effusion. Ascites is noted.  Impressions: Compared to a prior study in 07/2016, the pericardial effusion is larger - now moderate to severe with associated tamponade features (dilated IVC, RV and RA free wall collapse, mitral and tricuspid inflow variation), bilateral pleural effusions and ascites are noted. There is thickening of the  RV free wall and a gelatinous mass noted at the RA/RV junction measuring about 1.5 cm in diameter. Clinical correlation with tamponade physiology is highly recommended.  [place], DO 12/30/2016, 7:19 AM PGY-1, [place] Intern pager: [phone], text pages welcome

## 2016-12-30 NOTE — NC FL2 (Signed)
[place] LEVEL OF CARE SCREENING TOOL     IDENTIFICATION  Patient Name: Lisa [name] Birthdate: [birth year] Sex: female Admission Date (Current Location): 01/19/2017  [facility] and [place] Number:  Herbalist and Address:  [facility] [place], [place] [address] [place], [place] [address]      Provider Number: [id number]  Attending Physician Name and Address:  Blane Ohara McDiarmid, MD  Relative Name and Phone Number:       Current Level of Care: Hospital Recommended Level of Care: [place] Prior Approval Number:    Date Approved/Denied:   PASRR Number: [id number]  Discharge Plan: SNF    Current Diagnoses: Patient Active Problem List   Diagnosis Date Noted  . Anasarca   . Pleural effusion on left 12/25/2016  . Pleural effusion 01/22/2017  . Cirrhosis of liver with ascites ([place]) 01/14/2017  . Pericardial effusion with cardiac tamponade   . S/P thoracentesis   . Restrictive lung disease 12/15/2016  . Skin irritation 12/15/2016  . Ascites 12/15/2016  . Labial swelling 12/15/2016  . Poor dentition 12/07/2016  . Goals of care, counseling/discussion 11/17/2016  . Hepatocellular carcinoma ([place]) 09/09/2016  . Cardiovascular disease 08/03/2016  . Health care maintenance 08/03/2016  . Aortic atherosclerosis ([place]) 07/28/2016  . Polysubstance abuse 07/28/2016  . Liver mass 06/10/2016  . Cirrhosis of liver without ascites ([place]) 05/26/2015  . Aching pain 03/10/2015  . Mitral regurgitation 02/16/2015  . Anemia 12/29/2014  . Abdominal pain 12/23/2014  . Thigh pain 12/10/2014  . Productive cough 12/10/2014  . Allergic rhinitis 11/07/2014  . Tobacco abuse 07/24/2014  . Heart murmur 07/24/2014  . Neuropathic pain 07/07/2014  . Hypertensive urgency 05/21/2014  . Back pain 05/21/2014  . Chest pain 03/31/2014  . Alcohol abuse 08/19/2013  . Anxiety state, unspecified 08/19/2013  . Depression 01/24/2013  . Osteopetrosis   .  Hep C w/o coma, chronic ([place])   . HYPERTENSION, BENIGN 03/18/2010    Orientation RESPIRATION BLADDER Height & Weight     Self, Time, Situation, Place  Normal Continent Weight: 136 lb 11 oz (62 kg) Height:  5\' 3"  (160 cm)  BEHAVIORAL SYMPTOMS/MOOD NEUROLOGICAL BOWEL NUTRITION STATUS      Continent Diet (cardiac)  AMBULATORY STATUS COMMUNICATION OF NEEDS Skin   Limited Assist Verbally Surgical wounds                       Personal Care Assistance Level of Assistance  Bathing, Dressing Bathing Assistance: Limited assistance   Dressing Assistance: Limited assistance     Functional Limitations Info             SPECIAL CARE FACTORS FREQUENCY  PT (By licensed PT), OT (By licensed OT)     PT Frequency: 5/wk OT Frequency: 5/wk            Contractures      Additional Factors Info  Code Status, Allergies Code Status Info: FULL Allergies Info: Celecoxib, Sulfonamide Derivatives           Current Medications (12/30/2016):  This is the current hospital active medication list Current Facility-Administered Medications  Medication Dose Route Frequency Provider Last Rate Last Dose  . 0.9 %  sodium chloride infusion  250 mL Intravenous PRN Jettie Booze, MD      . acetaminophen (TYLENOL) tablet 650 mg  650 mg Oral Q4H PRN Jettie Booze, MD      . albuterol (PROVENTIL) (2.5 MG/3ML) 0.083%  nebulizer solution 2.5 mg  2.5 mg Nebulization Q4H PRN Nishant Dhungel, MD      . amLODipine (NORVASC) tablet 10 mg  10 mg Oral q1800 Asiyah Cletis Media, MD   10 mg at 12/29/16 2116  . aspirin EC tablet 81 mg  81 mg Oral Daily Nishant Dhungel, MD   81 mg at 12/30/16 1005  . bisacodyl (DULCOLAX) suppository 10 mg  10 mg Rectal Daily PRN Nishant Dhungel, MD   10 mg at 12/30/16 1005  . hydrOXYzine (ATARAX/VISTARIL) tablet 10 mg  10 mg Oral TID PRN Nishant Dhungel, MD      . magnesium hydroxide (MILK OF MAGNESIA) suspension 30 mL  30 mL Oral Daily PRN Blane Ohara McDiarmid, MD   30 mL  at 12/30/16 0530  . mometasone-formoterol (DULERA) 200-5 MCG/ACT inhaler 1 puff  1 puff Inhalation BID Nishant Dhungel, MD   1 puff at 12/29/16 0910  . morphine 4 MG/ML injection 2 mg  2 mg Intravenous Q4H PRN Lowella Dandy, MD   2 mg at 12/30/16 1540  . nicotine (NICODERM CQ - dosed in mg/24 hours) patch 21 mg  21 mg Transdermal Daily Carlyle Dolly, MD   21 mg at 12/30/16 1005  . ondansetron (ZOFRAN) tablet 4 mg  4 mg Oral Q6H PRN Nishant Dhungel, MD       Or  . ondansetron (ZOFRAN) injection 4 mg  4 mg Intravenous Q6H PRN Nishant Dhungel, MD      . ondansetron (ZOFRAN) injection 4 mg  4 mg Intravenous Q6H PRN Jettie Booze, MD   4 mg at 12/28/16 2250  . polyethylene glycol (MIRALAX / GLYCOLAX) packet 17 g  17 g Oral Daily Grayling Congress McMullen, DO   17 g at 12/30/16 1003  . sodium chloride flush (NS) 0.9 % injection 3 mL  3 mL Intravenous Q12H Jettie Booze, MD   3 mL at 12/30/16 1000  . sodium chloride flush (NS) 0.9 % injection 3 mL  3 mL Intravenous PRN Jettie Booze, MD      . thiamine (VITAMIN B-1) tablet 250 mg  250 mg Oral Daily Nishant Dhungel, MD   250 mg at 12/30/16 1005  . traMADol (ULTRAM) tablet 50 mg  50 mg Oral Q6H PRN Nishant Dhungel, MD   50 mg at 12/30/16 0450  . vitamin B-12 (CYANOCOBALAMIN) tablet 1,000 mcg  1,000 mcg Oral Daily Blane Ohara McDiarmid, MD   1,000 mcg at 12/30/16 1005     Discharge Medications: Please see discharge summary for a list of discharge medications.  Relevant Imaging Results:  Relevant Lab Results:   Additional Information SS#: [id number]  [name], LCSW

## 2016-12-30 NOTE — Progress Notes (Signed)
RN attempting to take report from off going RN, and patient continuously climbing OOB and confused, HR 120. RN paged covering MD, present at bedside.

## 2016-12-31 ENCOUNTER — Inpatient Hospital Stay (HOSPITAL_COMMUNITY): Payer: Medicaid Other

## 2016-12-31 DIAGNOSIS — K7290 Hepatic failure, unspecified without coma: Secondary | ICD-10-CM

## 2016-12-31 LAB — CBC
HCT: 42.2 % (ref 36.0–46.0)
Hemoglobin: 15 g/dL (ref 12.0–15.0)
MCH: 30.4 pg (ref 26.0–34.0)
MCHC: 35.5 g/dL (ref 30.0–36.0)
MCV: 85.4 fL (ref 78.0–100.0)
PLATELETS: 174 10*3/uL (ref 150–400)
RBC: 4.94 MIL/uL (ref 3.87–5.11)
RDW: 15.8 % — ABNORMAL HIGH (ref 11.5–15.5)
WBC: 9.7 10*3/uL (ref 4.0–10.5)

## 2016-12-31 LAB — BASIC METABOLIC PANEL
ANION GAP: 11 (ref 5–15)
Anion gap: 14 (ref 5–15)
BUN: 17 mg/dL (ref 6–20)
BUN: 19 mg/dL (ref 6–20)
CALCIUM: 9.4 mg/dL (ref 8.9–10.3)
CO2: 27 mmol/L (ref 22–32)
CO2: 23 mmol/L (ref 22–32)
CREATININE: 0.7 mg/dL (ref 0.44–1.00)
Calcium: 9.1 mg/dL (ref 8.9–10.3)
Chloride: 95 mmol/L — ABNORMAL LOW (ref 101–111)
Chloride: 96 mmol/L — ABNORMAL LOW (ref 101–111)
Creatinine, Ser: 0.77 mg/dL (ref 0.44–1.00)
GFR calc Af Amer: >60 mL/min (ref >60)
GLUCOSE: 102 mg/dL — AB (ref 65–99)
Glucose, Bld: 82 mg/dL (ref 65–99)
POTASSIUM: 5.5 mmol/L — AB (ref 3.5–5.1)
Potassium: 4.9 mmol/L (ref 3.5–5.1)
Sodium: 133 mmol/L — ABNORMAL LOW (ref 135–145)
Sodium: 133 mmol/L — ABNORMAL LOW (ref 135–145)

## 2016-12-31 LAB — TROPONIN I
TROPONIN I: 0.03 ng/mL — AB (ref <0.03)
Troponin I: 0.04 ng/mL (ref <0.03)

## 2016-12-31 LAB — CULTURE, BODY FLUID W GRAM STAIN -BOTTLE: Culture: NO GROWTH

## 2016-12-31 LAB — POCT I-STAT 3, ART BLOOD GAS (G3+)
Acid-Base Excess: 7 mmol/L — ABNORMAL HIGH (ref 0.0–2.0)
Bicarbonate: 28.6 mmol/L — ABNORMAL HIGH (ref 20.0–28.0)
O2 SAT: 87 %
PCO2 ART: 31.8 mmHg — AB (ref 32.0–48.0)
PH ART: 7.562 — AB (ref 7.350–7.450)
Patient temperature: 98.6
TCO2: 30 mmol/L (ref 0–100)
pO2, Arterial: 45 mmHg — ABNORMAL LOW (ref 83.0–108.0)

## 2016-12-31 LAB — CULTURE, BODY FLUID-BOTTLE

## 2016-12-31 LAB — GLUCOSE, CAPILLARY: Glucose-Capillary: 69 mg/dL (ref 65–99)

## 2016-12-31 LAB — AMMONIA: Ammonia: 237 umol/L — ABNORMAL HIGH (ref 9–35)

## 2016-12-31 MED ORDER — HYDRALAZINE HCL 20 MG/ML IJ SOLN
5.0000 mg | Freq: Four times a day (QID) | INTRAMUSCULAR | Status: DC | PRN
  Administered 2016-12-31: 5 mg via INTRAVENOUS
  Filled 2016-12-31: qty 1

## 2016-12-31 MED ORDER — MIDAZOLAM HCL 2 MG/2ML IJ SOLN
0.5000 mg | INTRAMUSCULAR | Status: DC | PRN
  Administered 2017-01-01: 0.5 mg via INTRAVENOUS
  Filled 2016-12-31: qty 2

## 2016-12-31 MED ORDER — MORPHINE SULFATE (PF) 4 MG/ML IV SOLN
1.0000 mg | INTRAVENOUS | Status: DC | PRN
  Administered 2016-12-31 – 2017-01-01 (×2): 1 mg via INTRAVENOUS
  Filled 2016-12-31: qty 1

## 2016-12-31 MED ORDER — GLYCOPYRROLATE 0.2 MG/ML IJ SOLN: 0.2000 mg | INTRAMUSCULAR | Status: DC | PRN

## 2016-12-31 MED ORDER — POLYVINYL ALCOHOL 1.4 % OP SOLN
1.0000 [drp] | Freq: Four times a day (QID) | OPHTHALMIC | Status: DC | PRN
  Filled 2016-12-31: qty 15

## 2016-12-31 MED ORDER — RIFAXIMIN 550 MG PO TABS
550.0000 mg | ORAL_TABLET | Freq: Three times a day (TID) | ORAL | Status: DC
  Administered 2016-12-31: 550 mg via ORAL
  Filled 2016-12-31 (×5): qty 1

## 2016-12-31 MED ORDER — LACTULOSE 10 GM/15ML PO SOLN
20.0000 g | ORAL | Status: AC
  Administered 2016-12-31 (×6): 20 g
  Filled 2016-12-31 (×5): qty 30

## 2016-12-31 MED ORDER — HALOPERIDOL LACTATE 5 MG/ML IJ SOLN: 0.5000 mg | INTRAMUSCULAR | Status: DC | PRN

## 2016-12-31 MED ORDER — ACETAMINOPHEN 325 MG PO TABS: 650.0000 mg | ORAL_TABLET | Freq: Four times a day (QID) | ORAL | Status: DC | PRN

## 2016-12-31 MED ORDER — ORAL CARE MOUTH RINSE
15.0000 mL | Freq: Two times a day (BID) | OROMUCOSAL | Status: DC
  Administered 2016-12-31 (×2): 15 mL via OROMUCOSAL

## 2016-12-31 MED ORDER — BIOTENE DRY MOUTH MT LIQD: 15.0000 mL | OROMUCOSAL | Status: DC | PRN

## 2016-12-31 MED ORDER — GLYCOPYRROLATE 1 MG PO TABS
1.0000 mg | ORAL_TABLET | ORAL | Status: DC | PRN
  Filled 2016-12-31: qty 1

## 2016-12-31 MED ORDER — GLYCOPYRROLATE 0.2 MG/ML IJ SOLN
0.2000 mg | INTRAMUSCULAR | Status: DC | PRN
  Administered 2016-12-31 – 2017-01-01 (×4): 0.2 mg via INTRAVENOUS
  Filled 2016-12-31 (×4): qty 1

## 2016-12-31 MED ORDER — ONDANSETRON 4 MG PO TBDP: 4.0000 mg | ORAL_TABLET | Freq: Four times a day (QID) | ORAL | Status: DC | PRN

## 2016-12-31 MED ORDER — HALOPERIDOL LACTATE 2 MG/ML PO CONC: 0.5000 mg | ORAL | Status: DC | PRN

## 2016-12-31 MED ORDER — ONDANSETRON HCL 4 MG/2ML IJ SOLN: 4.0000 mg | Freq: Four times a day (QID) | INTRAMUSCULAR | Status: DC | PRN

## 2016-12-31 MED ORDER — POLYVINYL ALCOHOL 1.4 % OP SOLN: 1.0000 [drp] | Freq: Four times a day (QID) | OPHTHALMIC | Status: DC | PRN

## 2016-12-31 MED ORDER — LACTULOSE ENEMA
300.0000 mL | Freq: Once | ORAL | Status: AC
  Administered 2016-12-31: 300 mL via RECTAL
  Filled 2016-12-31: qty 300

## 2016-12-31 MED ORDER — ACETAMINOPHEN 650 MG RE SUPP: 650.0000 mg | Freq: Four times a day (QID) | RECTAL | Status: DC | PRN

## 2016-12-31 MED ORDER — HALOPERIDOL 0.5 MG PO TABS: 0.5000 mg | ORAL_TABLET | ORAL | Status: DC | PRN

## 2016-12-31 NOTE — Consult Note (Signed)
Called by eLink to evaluate patient with obtundation.  PMhx: Most notable for polysubstance abuse and recent diagnosis of [place] admitted for complications of the same, including malignant pericardial effusion, plerual effusion, SVC mass, and cirrhosis. Floor nursing noted worsening mental status over the last 24 hours, with a significant change over the last 3-4 hours, to the point of the patient not responding to noxious stimulus. Elevated ammonia level, distended abdomen.  Exam: Middle-aged woman, unresponsive. Distended abd typampic to percussion. [place] breathing, with clear lung sounds. No peripheral edema.  Abd Xray: marked loops of small bowel, without clear obstruction point.  A/P: 61 y/o woman with [place] and cirrhosis with complications presenting with progressive confusion. Likely hepatic encephalopathy. Will start lactulose + rifxamin, although I'm concerned given her abdomen films that she may not take or tolerate these. She also has a significant combined mixed metabolic and respiratory alkalosis. Will try to treat underlying cause. Called patient's daughter, may need to consider transition to comfort measures only.  CRITICAL CARE Performed by: Luz Brazen   Total critical care time: 35 minutes  Critical care time was exclusive of separately billable procedures and treating other patients.  Critical care was necessary to treat or prevent imminent or life-threatening deterioration.  Critical care was time spent personally by me on the following activities: development of treatment plan with patient and/or surrogate as well as nursing, discussions with consultants, evaluation of patient's response to treatment, examination of patient, obtaining history from patient or surrogate, ordering and performing treatments and interventions, ordering and review of laboratory studies, ordering and review of radiographic studies, pulse oximetry and re-evaluation of patient's  condition.   Luz Brazen, MD Pulmonary & Critical Care Medicine December 31, 2016, 6:51 AM

## 2016-12-31 NOTE — Progress Notes (Signed)
PCCM interval note  I examined the patient, had an opportunity to join the family practice team and discuss her status, prognosis, current active issues. She is currently obtunded, has some upper airway noise, slight increased work to breathe. Her abdomen is distended. I suspect that she will continue decline, will not improve her mental status. I do not believe that mechanical ventilation or CPR have any chance to enhance her overall survival, quality of life. They're planning to give her more lactulose from above and also as an enema to see if this will change her hepatic encephalopathy. While there may be some benefit to be had I don't believe it will significantly change the overall course. I believe the family understands this, is hoping for a [place] more time with her and the opportunity to interact with her. Based on our conversation I will change her CODE STATUS to full DO NOT RESUSCITATE. I do not believe intubation or ACLS make sense. Discussed with family practice and they agree. They offered palliative care services which I think may help the family and may help with symptom management. Family is considering this. I will be available to assist as needed.  Independent CC time 20 minutes  Baltazar Apo, MD, PhD 12/31/2016, 9:23 AM [place] [facility] [phone] or if no answer [phone]

## 2016-12-31 NOTE — Consult Note (Signed)
Consultation Note Date: 12/31/2016   Patient Name: Lisa [name]  DOB: [birth year]  MRN: [id number]  Age / Sex: 61 y.o., female  PCP: Veatrice Bourbon, MD Referring Physician: Blane Ohara McDiarmid, MD  Reason for Consultation: Terminal Care  HPI/Patient Profile: 61 y.o. female  with past medical history of [place] cirrhosis admitted on 01/06/2017 with pleural effusion, pericardial effusion, hospital course complicated by ongoing encephalopathy, palliative consulted for [place] discussions.   Clinical Assessment and Goals of Care:  chart reviewed, patient seen and examined. Discussed with daughter at the bedside in detail, I introduced myself and palliative care as follows: Palliative medicine is specialized medical care for people living with serious illness. It focuses on providing relief from the symptoms and stress of a serious illness. The goal is to improve quality of life for both the patient and the family.  We talked about signs and symptoms at end of life and the fact that the patient appears to be actively dying. Brief life review performed, offered the patient's daughter active listening and supportive care. Patient continued to have progressive confusion, deemed secondary to hepatic encephalopathy. She still has an NGT, it is ok to continue to lactulose as a comfort measure. Additionally, patient still has elevated ammonia level and distended abdomen.   See recommendations listed below, thank you for the consult.   NEXT OF KIN  has 1 daughter   SUMMARY OF RECOMMENDATIONS    DNR DNI, comfort measures re confirmed with daughter Ms Thornton Papas present at the bedside Agree with Morphine IV PRN, Robinul and other comfort medications Add low dose Versed to help with air hunger and restless ness at end of life Prognosis hours to some very limited number of days discussed frankly with daughter  Offered chaplain services,  daughter politely declined Comfort cart for family, additional family members are arriving tonight Anticipated hospital death  Code Status/Advance Care Planning:  DNR    Symptom Management:    as above   Palliative Prophylaxis:   Delirium Protocol  Additional Recommendations (Limitations, Scope, Preferences):  Full Comfort Care  Psycho-social/Spiritual:   Desire for further Chaplaincy support:no Additional Recommendations: Caregiving  Support/Resources   Prognosis:   Hours - Days  Discharge Planning: Anticipated Hospital Death      Primary Diagnoses: Present on Admission: . Pleural effusion on left . Tobacco abuse . Polysubstance abuse . Hepatocellular carcinoma ([place]) . Alcohol abuse . Cirrhosis of liver with ascites ([place])   I have reviewed the medical record, interviewed the patient and family, and examined the patient. The following aspects are pertinent.  Past Medical History:  Diagnosis Date  . Alcohol abuse, daily use   . Cirrhosis of liver ([place])   . Helicobacter pylori gastritis   . Hepatitis C   . Hepatocellular carcinoma ([place]) dx'd 08/2016  . HTN (hypertension)   . Hx of cardiovascular stress test    Lexiscan Myoview 3/16: No ischemia, EF 66%, normal study  . Leg pain    ABIs 3/16:  normal   . Mitral valve  prolapse   . Osteopetrosis   . Poor dental hygiene   . Tobacco use    Social History   Social History  . Marital status: Divorced    Spouse name: N/A  . Number of children: 1  . Years of education: N/A   Occupational History  . personal care asst [employer] History Main Topics  . Smoking status: Current Every Day Smoker    Packs/day: 0.50    Years: 46.00    Types: Cigarettes    Start date: 09/26/1969  . Smokeless tobacco: Never Used     Comment: has cut back about 1/2 pk per day  . Alcohol use 25.2 oz/week    42 Standard drinks or equivalent per week     Comment: 40 oz beer a day   . Drug use: Yes     Types: Marijuana, Cocaine     Comment: Still using marijuana.  Past history of IV drug use  . Sexual activity: Not Currently   Other Topics Concern  . None   Social History Narrative   Lives alone in [place] in an apartment. 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 No current source of income. Goes to [employer] for help as needed.   One daughter, age 34. Six grandchildren, one great-grand child.     Rides bus for transportation   Family History  Problem Relation Age of Onset  . Cancer Father     Prostate  . Heart disease Mother   . Stroke Sister   . Heart disease Sister   . Lupus Sister   . Heart attack Neg Hx   . Colon cancer Neg Hx   . Colon polyps Neg Hx    Scheduled Meds: . amLODipine  10 mg Oral q1800  . aspirin EC  81 mg Oral Daily  . mouth rinse  15 mL Mouth Rinse BID  . mometasone-formoterol  1 puff Inhalation BID  . nicotine  21 mg Transdermal Daily  . polyethylene glycol  17 g Oral Daily  . rifaximin  550 mg Oral Q8H  . sodium chloride flush  3 mL Intravenous Q12H  . vitamin B-1  250 mg Oral Daily  . vitamin B-12  1,000 mcg Oral Daily   Continuous Infusions: PRN Meds:.sodium chloride, acetaminophen **OR** acetaminophen, albuterol, antiseptic oral rinse, bisacodyl, glycopyrrolate **OR** glycopyrrolate **OR** glycopyrrolate, haloperidol **OR** haloperidol **OR** haloperidol lactate, hydrALAZINE, hydrOXYzine, magnesium hydroxide, midazolam, morphine injection, ondansetron **OR** ondansetron (ZOFRAN) IV, polyvinyl alcohol, sodium chloride flush, traMADol Medications Prior to Admission:  Prior to Admission medications   Medication Sig Start Date End Date Taking? Authorizing Provider  aspirin 81 MG tablet Take 81 mg by mouth daily.   Yes Historical Provider, MD  hydrochlorothiazide (HYDRODIURIL) 25 MG tablet Take 1 tablet (25 mg total) by mouth daily. 08/30/16  Yes Jettie Booze, MD  hydrOXYzine (ATARAX/VISTARIL) 10 MG  tablet Take 1 tablet (10 mg total) by mouth 3 (three) times daily as needed for anxiety (or sleep). 12/07/16  Yes Alyssa A Haney, MD  lisinopril (PRINIVIL,ZESTRIL) 20 MG tablet Take 1 tablet (20 mg total) by mouth daily. 11/25/16  Yes Smiley Houseman, MD  mometasone-formoterol (DULERA) 200-5 MCG/ACT AERO Inhale 1 puff into the lungs 2 (two) times daily. 12/07/16  Yes Alyssa A Haney, MD  Potassium 99 MG TABS Take 99 mg by mouth daily.   Yes Historical Provider, MD  prochlorperazine (COMPAZINE) 10 MG tablet Take 1  tablet (10 mg total) by mouth every 6 (six) hours as needed for nausea or vomiting. 11/17/16  Yes Truitt Merle, MD  Thiamine HCl (VITAMIN B-1) 250 MG tablet Take 1 tablet (250 mg total) by mouth daily. 06/10/16  Yes Alyssa A Lincoln Brigham, MD  traMADol (ULTRAM) 50 MG tablet Take 1 tablet (50 mg total) by mouth every 6 (six) hours as needed. 12/08/16  Yes Truitt Merle, MD  vitamin B-12 (CYANOCOBALAMIN) 250 MCG tablet Take 1 tablet (250 mcg total) by mouth daily. Patient taking differently: Take 500 mcg by mouth daily.  01/01/16  Yes York Ram Melancon, MD  amLODipine (NORVASC) 10 MG tablet Take 1 tablet (10 mg total) by mouth daily. Patient not taking: Reported on 12/22/2016 08/30/16   Jettie Booze, MD  docusate sodium (COLACE) 100 MG capsule Take 1 capsule (100 mg total) by mouth 2 (two) times daily. Patient not taking: Reported on 01/17/2017 11/25/16   Smiley Houseman, MD  ferrous sulfate 325 (65 FE) MG tablet Take 1 tablet (325 mg total) by mouth daily with breakfast. Patient not taking: Reported on 01/21/2017 01/09/15   Leone Haven, MD  folic acid (FOLVITE) 1 MG tablet Take 1 tablet (1 mg total) by mouth daily. Patient not taking: Reported on 01/22/2017 08/03/16   Veatrice Bourbon, MD  furosemide (LASIX) 20 MG tablet Take 1 tablet (20 mg total) by mouth daily. Patient not taking: Reported on 12/31/2016 12/22/16   Truitt Merle, MD  methocarbamol (ROBAXIN) 500 MG tablet Take 1 tablet (500 mg total) by mouth 2 (two)  times daily. Patient not taking: Reported on 01/21/2017 10/11/16   Lacretia Leigh, MD  naphazoline-pheniramine (NAPHCON-A) 0.025-0.3 % ophthalmic solution Place 1 drop into both eyes every 4 (four) hours as needed for irritation. Patient not taking: Reported on 01/07/2017 06/02/16   Konrad Felix, PA  potassium chloride SA (K-DUR,KLOR-CON) 20 MEQ tablet Take 1 tablet (20 mEq total) by mouth 2 (two) times daily. Patient not taking: Reported on 12/30/2016 11/03/16   Truitt Merle, MD   Allergies  Allergen Reactions  . Celecoxib Hives  . Sulfonamide Derivatives Hives, Rash and Other (See Comments)    Sulfamethoxazole    Review of Systems Non verbal  Physical Exam Unresponsive Rapid shallow breathing, noisy S1 S2 Abdomen distended No edema  Vital Signs: BP (!) 93/53   Pulse (!) 152   Temp 98.8 F (37.1 C) (Oral)   Resp (!) 22   Ht 5\' 3"  (1.6 m)   Wt 58.4 kg (128 lb 12 oz)   SpO2 96%   BMI 22.81 kg/m  Pain Assessment: CPOT POSS *See Group Information*: 1-Acceptable,Awake and alert Pain Score: Asleep   SpO2: SpO2: 96 % O2 Device:SpO2: 96 % O2 Flow Rate: .O2 Flow Rate (L/min): 2 L/min  IO: Intake/output summary:  Intake/Output Summary (Last 24 hours) at 12/31/16 1646 Last data filed at 12/31/16 1400  Gross per 24 hour  Intake              450 ml  Output             2352 ml  Net            -1902 ml    LBM: Last BM Date: 12/30/16 Baseline Weight: Weight: (S) 61.3 kg (135 lb 2 oz) Most recent weight: Weight: 58.4 kg (128 lb 12 oz)     Palliative Assessment/Data:   Flowsheet Rows     Most Recent Value  Intake Tab  Referral  Department  Critical care  Unit at Time of Referral  ICU  Palliative Care Primary Diagnosis  Cancer  Palliative Care Type  New Palliative care  Reason for referral  End of Life Care Assistance, Clarify Goals of Care  Date first seen by Palliative Care  12/31/16  Clinical Assessment  Palliative Performance Scale Score  10%  Pain Max last 24 hours  5  Pain  Min Last 24 hours  4  Dyspnea Max Last 24 Hours  7  Dyspnea Min Last 24 hours  6  Nausea Max Last 24 Hours  4  Nausea Min Last 24 Hours  3  Psychosocial & Spiritual Assessment  Palliative Care Outcomes  Patient/Family meeting held?  Yes  Who was at the meeting?  patient daughter   Palliative Care Outcomes  Improved non-pain symptom therapy      Time In:  9476 Time Out:  1645 Time Total:  60  Greater than 50%  of this time was spent counseling and coordinating care related to the above assessment and plan.  Signed by: Loistine Chance, MD  [phone]  Please contact Palliative Medicine Team phone at [phone] for questions and concerns.  For individual provider: See Shea Evans

## 2016-12-31 NOTE — Progress Notes (Addendum)
Dr. Juanito Doom notified of 1000 cc of bile-colored fluid from NG tube.  New orders received. Markela Wee, Ardeth Sportsman  15:00-patient appears to be in SVT rate 150's, BP dropped to 96/53 and respirations more labored and noisy.  Dr. Juanito Doom notified of change and patient's daughter called and notified of change in status.  Will continue to monitor. [place], Ardeth Sportsman

## 2016-12-31 NOTE — Progress Notes (Addendum)
FPTS Interim Progress Note  S: Nursing called to report change in mental status. Reported that patient could not be aroused, would not open eyes spontaneously, will not withdraw to pain, does not speak/verbalize.   Had reportedly had bilious emesis and urinary incontinence.   Vitals are stable   O: BP (!) 128/59   Pulse (!) 117   Temp 98 F (36.7 C) (Axillary)   Resp (!) 24   Ht 5\' 3"  (1.6 m)   Wt 58.4 kg (128 lb 12 oz)   SpO2 94%   BMI 22.81 kg/m   GEN: GCS 4 HEENT: PERRL  CV: mild tachycardia, no murmurs, rubs, or gallops PULM: somewhat agonal breathing, coarse breath sounds bilaterally (anteriorly), on room air with normal saturations  ABD: distended, somewhat soft, hypoactive bowel sounds  SKIN: No rash or cyanosis; warm and well-perfused EXTR: No lower extremity edema or calf tenderness  A/P: Likely hepatic encephalopathy as her ammonia was 237. Due GCS 4 and agonal breathing STAT ABG. CCM called and recommends NG tube with lactulose. CXR ordered Discussed with FPTS attending, will also order STAT CT head.   Smiley Houseman, MD 12/31/2016, 5:47 AM PGY-2, [facility] Medicine Service pager [phone]

## 2016-12-31 NOTE — Progress Notes (Signed)
FPTS Interim Progress Note  Spoke with nursing to get update on Ms. Goelz. Seems to be resting/sleeping comfortably. HR 115 and in NAD. Her ammonia is significantly elevated from prior level which is likely contributing to her AMS. Since she is comfortable right now, will start Lactulose in the morning in a few hours. May need lactulose enema. BMP and troponin pending.   Smiley Houseman, MD 12/31/2016, 2:17 AM PGY-2, [facility] Medicine Service pager [phone]

## 2016-12-31 NOTE — Progress Notes (Signed)
Notified by RN that patient was in SVT. Went to bedside. By the time I arrived patient was no longer in SVT but continued to have sinus tachycardia and hypotension. She continues to be obtunded with agonal breathing and increased oral secretions. Patient's family was in the room. Discussed with patient's daughter and the rest of the family about patient's current state and how she has not shown any signs of improvement even after the lactulose enema. Patient's daughter agreeable to transitioning to comfort care at this time. Confirmed DNR/DNI status and what that meant. Patient's daughter interested in speaking with palliative care.  Smitty Cords, MD [place], PGY-2

## 2016-12-31 NOTE — Progress Notes (Addendum)
FPTS Interim Progress Note Late Entry  S: Received page by nursing reporting that patient is confused, trying to get out of bed, and is tachycardic to 120s and hypertensive.   Went to evaluate patient. She is unable to answer any questions. She does open her eyes but does not follow commands. Nursing reports that she is able to move all extremities and stood at the bedside as well.   O: BP (!) 198/98 (BP Location: Right Arm)   Pulse (!) 125   Temp 98.3 F (36.8 C) (Oral)   Resp (!) 24   Ht 5\' 3"  (1.6 m)   Wt 62 kg (136 lb 11 oz)   SpO2 93%   BMI 24.21 kg/m   GEN: NAD, She is unable to answer any questions. She does open her eyes but does not follow commands HEENT: PERRL CV: tachycardia, regular rhythm PULM: CTAB, normal effort ABD: Soft, distended, nontender, + bowel sounds SKIN: No rash or cyanosis; warm and well-perfused EXTR: No lower extremity edema or calf tenderness PSYCH: Mood and affect euthymic, normal rate and volume of speech NEURO: Awake, alert, no focal deficits grossly, normal speech  A/P: STAT EKG, troponin, bmp, ammonia Hydralazine PRN  EKG: sinus tachycardia with low voltage. BP is elevated. Discussed with cardiology unlikely tamponade but it is possible that she may be re-accumulating fluid.  Other considerations include PE with tachycardia, although no desaturations and no increased work of breathing.   Patient is now sleeping without distress.   Smiley Houseman, MD 12/31/2016, 12:20 AM PGY-2, [facility] Medicine Service pager [phone]

## 2016-12-31 NOTE — Progress Notes (Signed)
[facility] Teaching Service Daily Progress Note Intern Pager: [phone]  Patient name: Lisa [name] Medical record number: [id number] Date of birth: 11-16-1955 Age: 61 y.o. Gender: female  Primary Care Provider: Marina Goodell, MD Consultants: Cardiology, Oncology Code Status: Full   Pt Overview and Major Events to Date:  04/02: [facility], received left thoracentesis, transferred to [facility] for pericardiocentesis, then transferred to [place] 04/04: Echo repeated and pericardial drain pulled, oncology to f/u outpt 04/06: Repeat CXR shows small-to-moderate stable left pleural effusion 04/07: Confused and unarousable  Assessment and Plan: Lisa [name] is a 61 year old female presenting with worsening leg swelling, fatigue, abdominal and breast swelling x 2 weeks, found to have large left pleural effusion S/P thoracentesis on 4/2 at [facility]. Also found to have pericardial effusion concerning for cardiac tamponade on, transferred to [facility] for pericardiocentesis. Past medical history significant for hepatocellular carcinoma, portal vein thrombosis, hepatitis C, polysubstance abuse, chronic back pain. Patient admitted to [facility] and was transferred to [facility] on April 2 for further care.  #Obtunded. Llikely secondary to hepatic encephalopathy: Patient was confused at some point earlier this morning and then became unarousable. Ammonia was noted to be 237. GCS 4. ABG showing alkalosis. Abdominal imaging showing ileus. CCM called and an NG tube was placed. Oral lactulose was given. Had a lengthy discussion with patient's family in regards to patient current state. Patient has agonal breathing and is unarousable. This discussion was with Dr. Lamonte Sakai and Dr. Ree Kida in the room as well. Family is understanding about her prognosis. CODE STATUS changed to DO NOT RESUSCITATE. -We will give lactulose enema at this time as patient unable to be alert enough to tolerate  orals -Offered palliative to see patient's family, they want to think about more -We'll likely need to transition to comfort care if lactulose does not help patient  #Dyspnea  Left-Sided Pleural Effusion: Unable to assess patient's shortness of breath symptoms that she is obtunded. The actual pleural effusion is stable. Small to moderate size by CXR. S/p pleurocentesis. No organisms seen. Lights criteria transudative. Will continue to manage with IV Lasix and monitor UOP for management. If unsuccessful, will consider alternative options. Hopefully we can avoid Pleurx. --Monitor respiratory status --We will continue IV Lasix 40 mg and monitor UOP  #Pericardial Effusion with Tamponade: S/p thoracocentesis. Found on echo 4/2. Pericardiocentesis performed 4/2 with 600 mL with drain placed. Light criteria fluid transudative. Unlikely malignancy. --Cardiology consulted, appreciate recommendations: pulled pericardiocentesis drain --Oncology following, appreciate recommendations: rec GI consult for ascites management, diuretics, Nivolumab next Thursday with repeat CT --Continuous cardiac monitoring --Morphine 2 mq q4h PRN pain  #Multifocal Hepatocellular Carcinoma, Stage II: Diagnosed 07/2016, on Nivolumab q2 weeks with oncologist. [name] initiated on 11/24/2016. Status post IV albumin and IV Lasix at [facility]. --Monitor vitals and for signs of edema -See above for hepatic encephalopathy  #Liver Cirrhosis with Ascites, H/o Treated Hep C  Abdominal pain: Stable. Paracentesis labs support transudative effusion. Secondary to liver cancer, no signs of SBP. AST decreasing, ALT and ALP WNL. --See oncology rec above, start diuretics  #Cardiac Mass: Gelatinous mass noted at the RA/RV junction measuring 1.5 cm in diameter. Possibly malignant in etiology given history of hepatocellular carcinoma. --See plan above  #Polysubstance Abuse: History of tobacco abuse as well as alcohol, cocaine, and marijuana. UDS  positive for cocaine on admission. 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 Not on chronic anticoagulation  per her discussion with oncology with risks outweighing benefits.  #Hypertension: Chronic. Uncontrolled. Home med include ACE-I and CCB. --Amlodipine 10 mg QD --Holding home lisinopril 20 mg QD  #Chronic back pain: No change from baseline. --Continue home tramadol 50 mg q6h PRN  #Thrombocytopenia: Stable. Platelets 119 today. Likely secondary to hepatic cirrhosis. INR at 1.23.   #Hyponatremia: Stable. Likely secondary to cirrhosis and or HCTZ use which was recently discontinued by her oncologist.  #Suspected COPD: On dulera and albuterol at home. --Dulera 1 puff BID, albuterol PRN  FEN/GI: Clear liquid diet, SLIV PPx: SCDs  Disposition: Continue to monitor inpatient.  Subjective:  Patient obtunded. Family in the room.  Objective: Temp:  [97.4 F (36.3 C)-98.3 F (36.8 C)] 97.8 F (36.6 C) (04/07 0742) Pulse Rate:  [91-125] 111 (04/07 0800) Resp:  [7-24] 18 (04/07 0800) BP: (116-198)/(51-98) 145/73 (04/07 0800) SpO2:  [93 %-98 %] 96 % (04/07 0800) Weight:  [128 lb 12 oz (58.4 kg)] 128 lb 12 oz (58.4 kg) (04/07 0314) Physical Exam: General: well nourished, well developed, unresponsive to stimuli HEENT: NG tube in place with green colored output CV: Tachycardic rate and irregular rhythm without murmurs, rubs, or gallops. 2+ pitting edema in ankles. SCDs in place Lungs: Agonal breathing with upper airway noises Abdomen: Distended, normoactive bowel sounds Skin: warm, dry, no rashes or lesions, cap refill < 2 seconds Extremities: warm and well perfused, normal tone  Laboratory:  Recent Labs Lab 12/29/16 0218 12/30/16 0235 12/31/16 0034  WBC 9.3 7.6 9.7  HGB 15.8* 15.3* 15.0  HCT 43.6 42.8 42.2  PLT 125* 161 174    Recent Labs Lab 12/28/2016 0445  12/27/16 0242 12/28/16 0204   12/30/16 0235 12/31/16 0034 12/31/16 0618  NA 129*  --  128* 126*  < > 128* 133* 133*  K 4.9  --  5.5* 5.6*  < > 5.1 4.9 5.5*  CL 101  --  100* 99*  < > 95* 95* 96*  CO2 24  --  22 22  < > 26 27 23   BUN 21*  --  19 17  < > 17 17 19   CREATININE 0.85  < > 0.94 0.74  < > 0.74 0.70 0.77  CALCIUM 8.3*  --  8.0* 8.1*  < > 8.9 9.4 9.1  PROT 7.6  --  7.7 7.8  --   --   --   --   BILITOT 1.7*  --  1.7* 1.9*  --   --   --   --   ALKPHOS 117  --  103 103  --   --   --   --   ALT 38  --  37 32  --   --   --   --   AST 115*  --  109* 85*  --   --   --   --   GLUCOSE 77  < > 119* 103*  < > 118* 102* 82  < > = values in this interval not displayed.  UDS: +Cocaine HIV: Non-reactive Lactate dehydrogenase: 305 > 91 (H) INR: 1.23  TSH: 3.462 (WNL)  Imaging/Diagnostic Tests: DG Chest 2 View (12/30/2016) FINDINGS: Low lung volumes. Stable cardiomediastinal silhouette with normal heart size and aortic atherosclerosis. No pneumothorax. Stable small right and small to moderate left pleural effusions. No pulmonary edema. Patchy consolidation at the left greater than right lung bases, stable.  IMPRESSION: 1. Stable small moderate left and small right pleural effusions. 2. Stable patchy left  greater than right bibasilar lung consolidation, worrisome for multilobar pneumonia or aspiration. 3. Aortic atherosclerosis.  Echocardiogram (12/28/2016) IMPRESSION: Mild inferolateral pericardial effusion, no evidence of tamponade. Large left pleural effusion.  Pericardiocentesis (01/13/2017) Successful pericardiocentesis with 600 mL of fluid removed. Monitor output of the drain. Hopefully, this can be removed soon. She does have significant ascites which may need to be managed with paracentesis.  US THORACENTESIS ASP PLEURAL SPACE W/IMG GUIDE (01/12/2017) FINDINGS: A total of approximately 800 mL of clear, yellow fluid was removed. Samples were sent to the laboratory as requested by the clinical  team.  IMPRESSION: Successful ultrasound guided diagnostic and therapeutic left thoracentesis yielding 800 mL of pleural fluid.  DG Chest 2 View (01/12/2017) FINDINGS: Moderate left pleural effusion. Mild atelectasis or consolidation adjacent to the pleural effusion. The right lung is clear. Pulmonary vasculature is normal.  IMPRESSION: New left pleural effusion with adjacent left lung base atelectasis or consolidation.  DG Chest 1 View (01/22/2017) FINDINGS: Stable cardiomegaly. Atherosclerosis of thoracic aorta is noted. No pneumothorax is seen status post left thoracentesis. Left pleural effusion is significantly smaller. Left basilar atelectasis or infiltrate is noted. Mild right basilar atelectasis is noted as well.  IMPRESSION: No pneumothorax status post left-sided thoracentesis. Left pleural effusion is significantly smaller.  Echocardiogram (01/05/2017) Study Conclusions - Left ventricle: The cavity size was normal. Wall thickness wasincreased in a pattern of severe LVH. Systolic function wasvigorous. The estimated ejection fraction was in the range of 65%to 70%. Wall motion was normal; there were no regional wallmotion abnormalities. Doppler parameters are consistent withabnormal left ventricular relaxation (grade 1 diastolicdysfunction). The E/e&' ratio is >15, suggesting elevated LVfilling pressure. - Aortic valve: Trileaflet. Mobile strands at the leaflet tips -could be Lambl&'s excrescences (normal variant) or less likelythrombus/vegetation. - Mitral valve: Calcified annulus. Mildly thickened leaflets . Mildregurgitation. There is >25% mitral inflow variation withrespiration. - Left atrium: Severely dilated. - Right ventricle: The cavity size was normal. There is thickeningof the RV free wall with a mass noted at the RV/RA junction -this could represent thrombus or malignancy. Systolic functionwas normal. Diastolic collapse is noted. - Right atrium: Normal  size. Diastolic collapse of the free wall isnoted. - Tricuspid valve: There was trivial regurgitation. Greater than35% tricuspid inflow variation with respiration. - Pulmonary arteries: Dilated. - Systemic veins: The IVC measures <2.1 cm, but does not collapse >50%, suggesting an elevated RA pressure of 8 mmHg. - Pericardium, extracardiac: Moderate to large circumferential pericardial effusion. There was a right pleural effusion. There was a left pleural effusion. Ascites is noted.  Impressions: Compared to a prior study in 07/2016, the pericardial effusion is larger - now moderate to severe with associated tamponade features (dilated IVC, RV and RA free wall collapse, mitral and tricuspid inflow variation), bilateral pleural effusions and ascites are noted. There is thickening of the RV free wall and a gelatinous mass noted at the RA/RV junction measuring about 1.5 cm in diameter. Clinical correlation with tamponade physiology is highly recommended.  Carlyle Dolly, MD 12/31/2016, 10:00 AM PGY-2, [place] Intern pager: [phone], text pages welcome

## 2016-12-31 NOTE — Progress Notes (Signed)
Discussed code status with daughter, who is very tearful and seems to be appropriately understanding her mother's impending demise. She is awaiting other family members to arrive. I recommended that her mother be DNR in the meantime, and although we did not explicitly resolve intubation, she seems to be willing to allow her mother to die naturally. At this time, I interpret our conversation for her to be a partial code - OK with intubation only, but not for CPR or other ACLS measures. Will address intubation once other family members arrive, but advised Allena Katz, RN that in the event of impending respiratory failure, that full DNR (I.e. No intubation) be offered.  Luz Brazen, MD Pulmonary & Critical Care Medicine December 31, 2016, 7:40 AM

## 2017-01-01 ENCOUNTER — Other Ambulatory Visit: Payer: Self-pay | Admitting: Family Medicine

## 2017-01-01 ENCOUNTER — Encounter (HOSPITAL_COMMUNITY): Payer: Self-pay | Admitting: Family Medicine

## 2017-01-01 LAB — CULTURE, BODY FLUID-BOTTLE: CULTURE: NO GROWTH

## 2017-01-01 LAB — CULTURE, BODY FLUID W GRAM STAIN -BOTTLE

## 2017-01-01 MED ORDER — CHLORHEXIDINE GLUCONATE 0.12 % MT SOLN: 15.0000 mL | Freq: Two times a day (BID) | OROMUCOSAL | Status: DC

## 2017-01-01 MED ORDER — ORAL CARE MOUTH RINSE: 15.0000 mL | Freq: Two times a day (BID) | OROMUCOSAL | Status: DC

## 2017-01-01 MED ORDER — SODIUM CHLORIDE 0.9 % IV SOLN
1.0000 mg/h | INTRAVENOUS | Status: DC
  Administered 2017-01-01: 4 mg/h via INTRAVENOUS
  Filled 2017-01-01: qty 10

## 2017-01-01 MED ORDER — MIDAZOLAM HCL 2 MG/2ML IJ SOLN
0.5000 mg | INTRAMUSCULAR | Status: DC | PRN
  Administered 2017-01-01 (×2): 0.5 mg via INTRAVENOUS
  Filled 2017-01-01 (×2): qty 2

## 2017-01-02 ENCOUNTER — Inpatient Hospital Stay: Payer: Medicaid Other | Admitting: Student

## 2017-01-05 ENCOUNTER — Ambulatory Visit: Payer: Medicaid Other | Admitting: Hematology

## 2017-01-05 ENCOUNTER — Other Ambulatory Visit: Payer: Medicaid Other

## 2017-01-05 ENCOUNTER — Ambulatory Visit: Payer: Medicaid Other

## 2017-01-06 ENCOUNTER — Other Ambulatory Visit: Payer: Self-pay | Admitting: Nurse Practitioner

## 2017-01-19 IMAGING — CT CT CHEST W/O CM
2 of 3 series · 15 of 36 positions shown, 18 images · non-contrast
Comparison: None.

CLINICAL DATA: Hepatocellular carcinoma. Shortness of breath with
exertion.

EXAM:
CT CHEST WITHOUT CONTRAST
TECHNIQUE: Multidetector CT imaging of the chest was performed following the
standard protocol without IV contrast.

[Series 2: thorax · axial · 0.68mm/px · z∈[+1404,+1622]mm · 12 of 129 slices shown, 15 images]
[im 10/129  mediastinal]
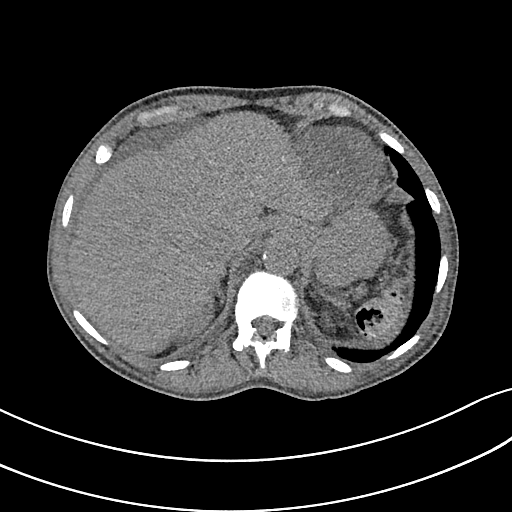
[im 10/129  lung]
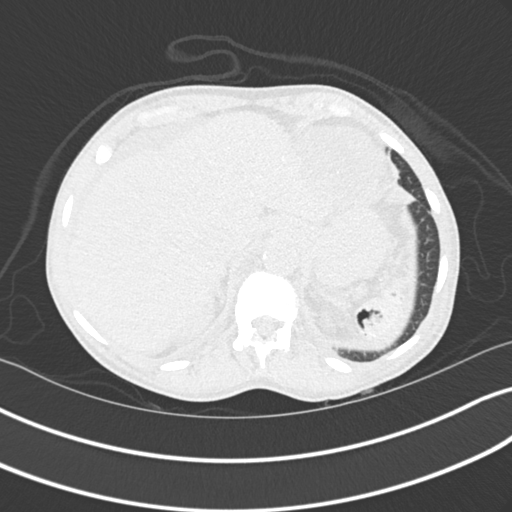
[im 19/129  lung]
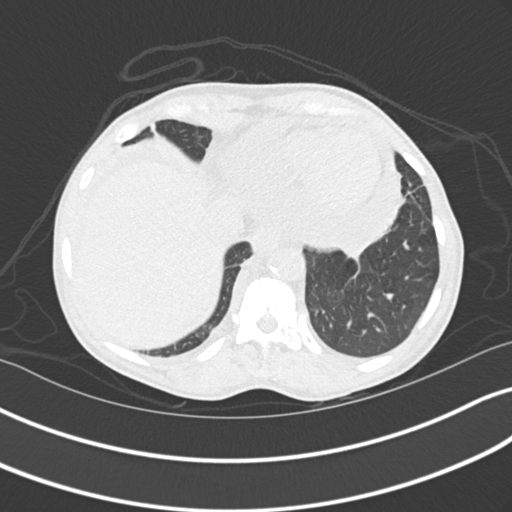
[im 29/129  lung]
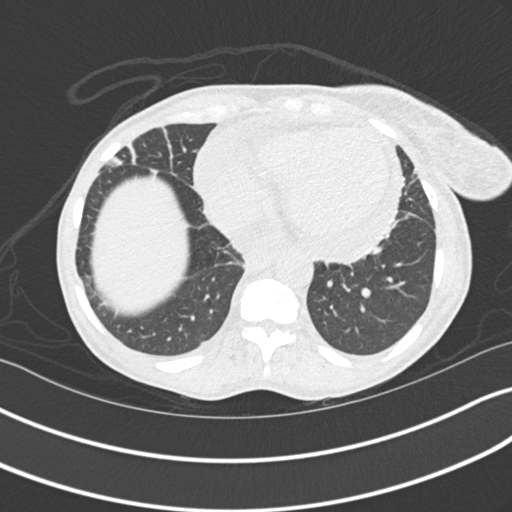
[im 38/129  lung]
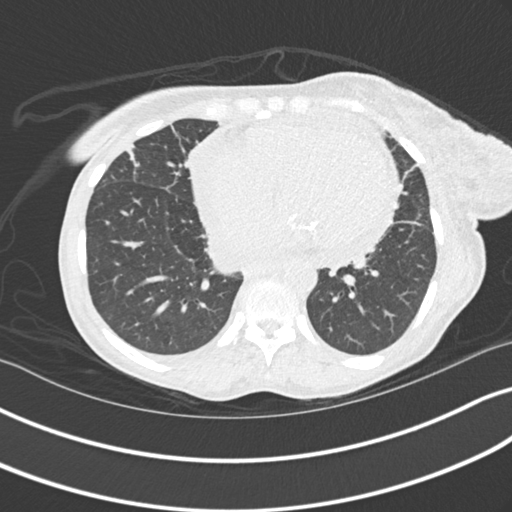
[im 48/129  mediastinal]
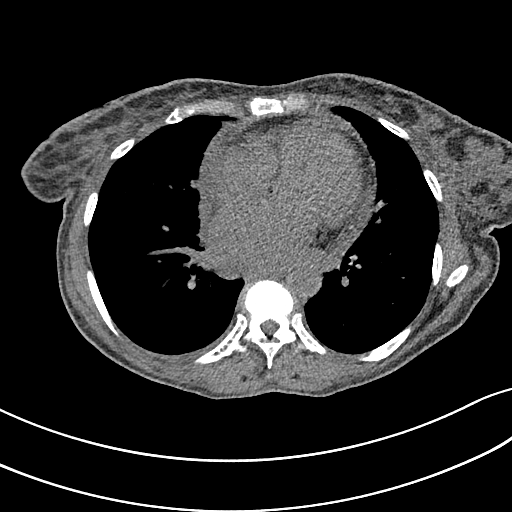
[im 48/129  lung]
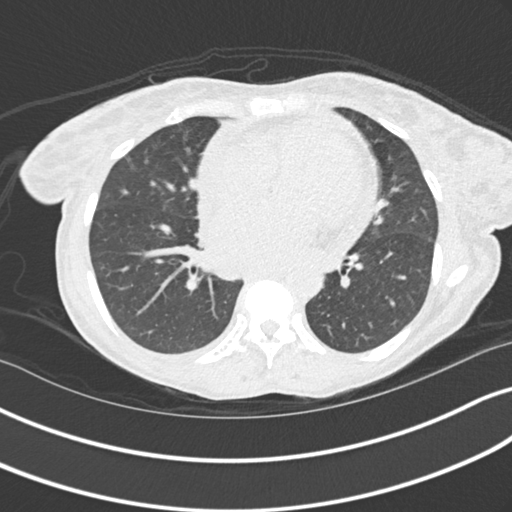
[im 57/129  lung]
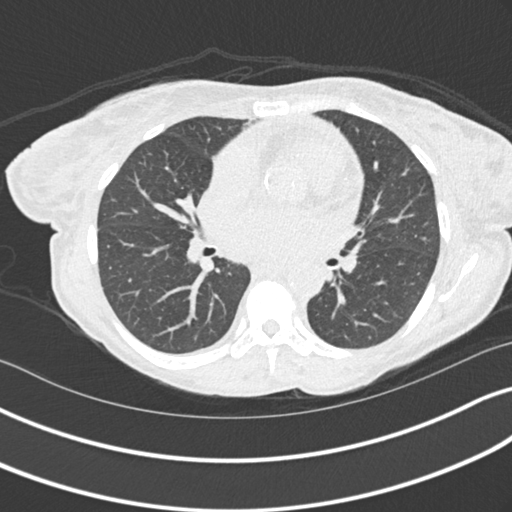
[im 72/129  lung]
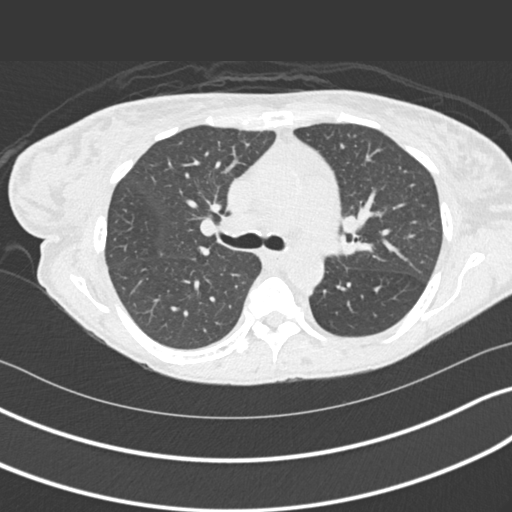
[im 81/129  lung]
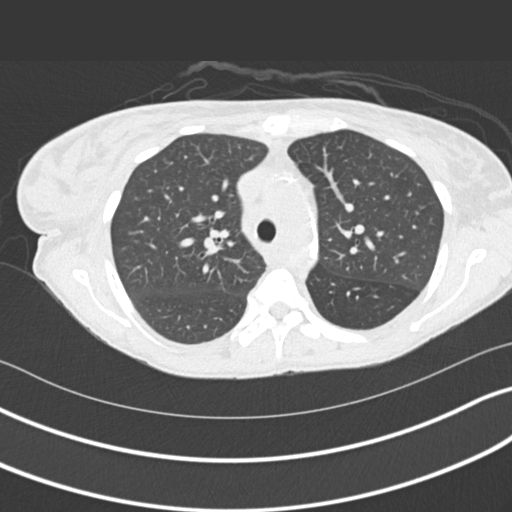
[im 91/129  mediastinal]
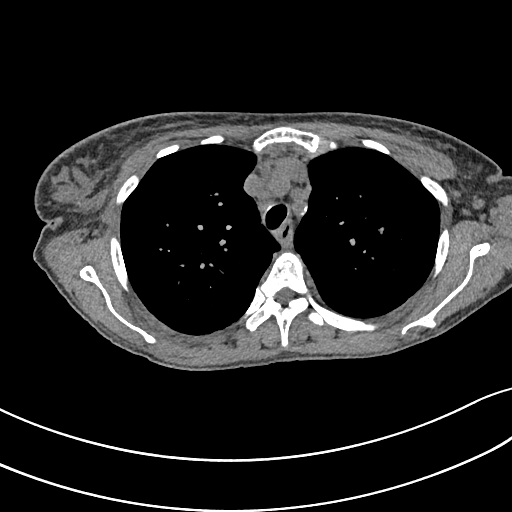
[im 91/129  lung]
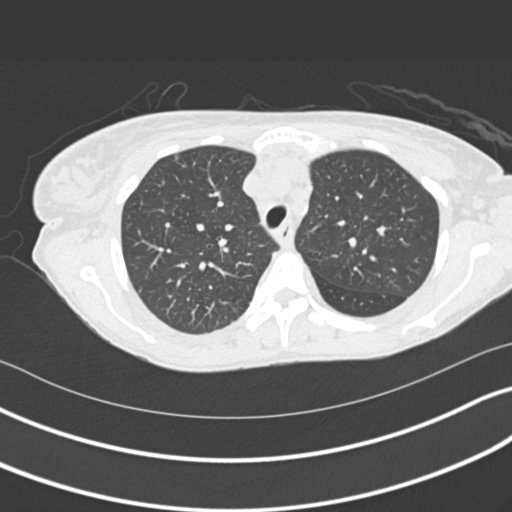
[im 100/129  lung]
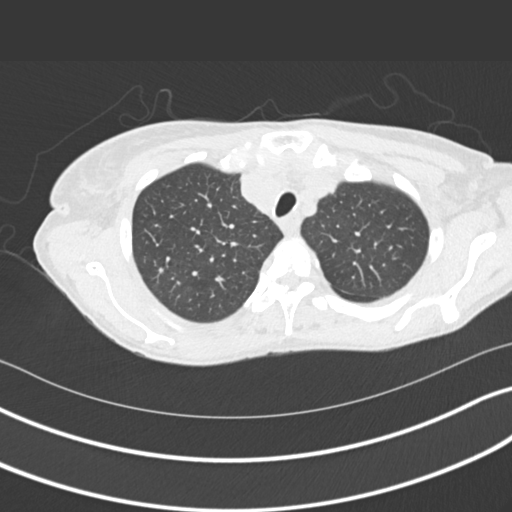
[im 110/129  lung]
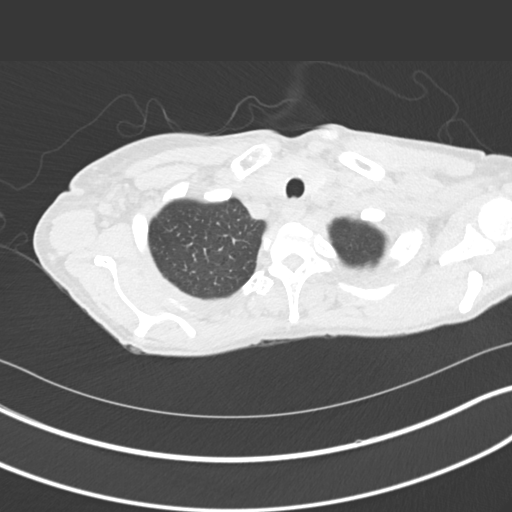
[im 119/129  lung]
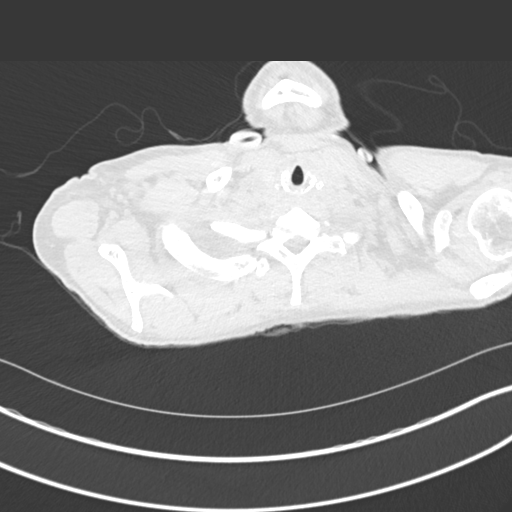

[Series 5: coronal · coronal · 0.59mm/px · 3 of 108 slices shown]
[im 22/108  lung]
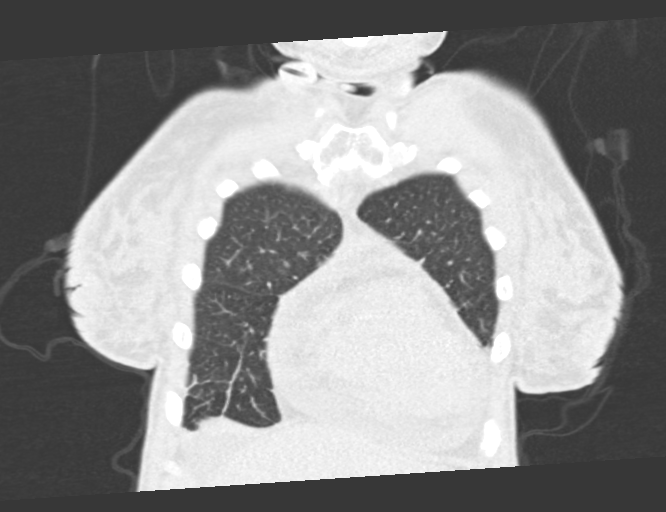
[im 43/108  lung]
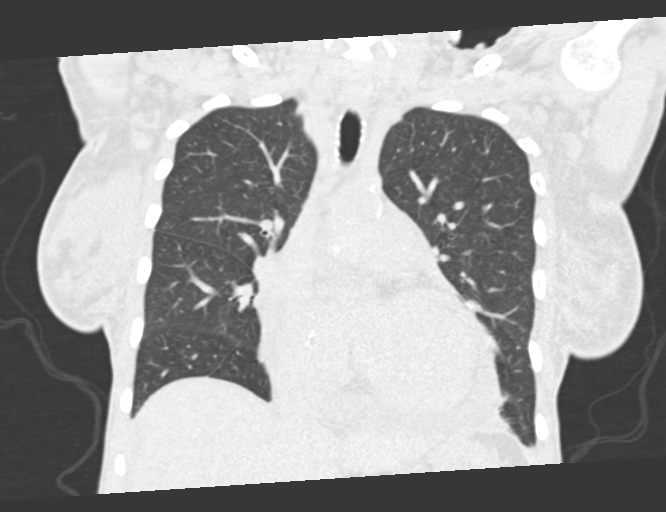
[im 65/108  lung]
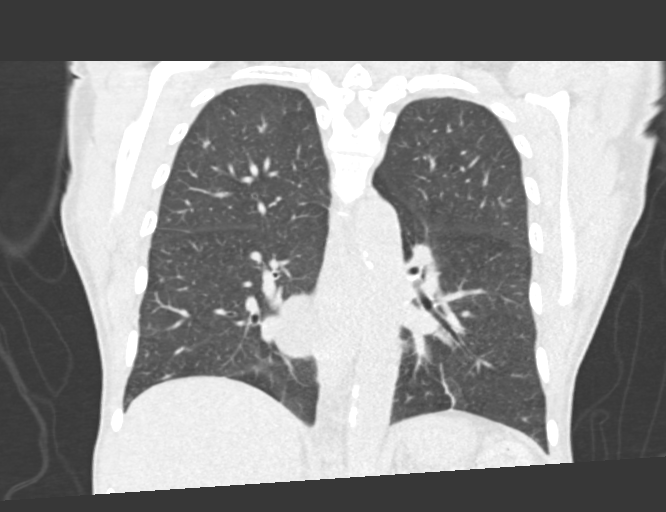

[15 of 36 positions shown; findings below may reference images not displayed]

FINDINGS: Cardiovascular: The heart is enlarged. Small pericardial effusion is
evident. Atherosclerotic calcification is noted in the wall of the
thoracic aorta.

Mediastinum/Nodes: 9 mm short axis right paratracheal lymph node is
upper normal for size. Scattered small mediastinal lymph nodes are
identified, but no definite mediastinal lymphadenopathy by CT size
criteria. No evidence for gross hilar lymphadenopathy although
assessment is limited by the lack of intravenous contrast on today's
study. The esophagus has normal imaging features. Prominent upper
normal lymph nodes are seen in both axillary regions.

Lungs/Pleura: 3 mm irregular nodule identified right upper lobe on
image 35 series 7. 3 mm right upper lobe nodule also seen on image
30 series 7. No suspicious pulmonary nodule or mass in the left
lung. There is no pulmonary edema on today's study. No evidence for
pleural effusion. Scattered areas of cysts subsegmental atelectasis
or linear scarring noted bilaterally.

Upper Abdomen: Nodular hepatic contour compatible with cirrhosis.
Small volume perihepatic ascites identified.

Musculoskeletal: Bone windows reveal no worrisome lytic or sclerotic
osseous lesions.
IMPRESSION: 1. Tiny right upper lobe pulmonary nodules. Nonspecific, but
follow-up will be required as metastatic disease could have this
appearance. Consider repeat CT scan of the chest in 3-6 months.
2. Cardiomegaly with small pericardial effusion.

## 2017-01-24 NOTE — Progress Notes (Signed)
Pt noted to have pronounced Cheyne-Stokes respirations, and is coughing up a fair amount of dark red blood. Dr. Juanito Doom updated, as well as patient's daughter Thornton Papas. Morphine gtt at 20ml/ hr as ordered, with prn pushes of versed. Pt remains completely obtunded. RN will continue to monitor.

## 2017-01-24 NOTE — Progress Notes (Signed)
Pt noted to have increased and labored respirations with borderline " Seizure-like" activity. Dr. Juanito Doom notified, will adjust MS. RN will continue to monitor.

## 2017-01-24 NOTE — Progress Notes (Signed)
Family Medicine Teaching Service Daily Progress Note Intern Pager: [phone]  Patient name: Lisa [name] Medical record number: [id number] Date of birth: [birth year] Age: 61 y.o. Gender: female  Primary Care Provider: Marina Goodell, MD Consultants: Cardiology, Oncology Code Status: Full   Pt Overview and Major Events to Date:  04/02: [facility], received left thoracentesis, transferred to [facility] for pericardiocentesis, then transferred to [place] 04/04: Echo repeated and pericardial drain pulled, oncology to f/u outpt 04/06: Repeat CXR shows small-to-moderate stable left pleural effusion 04/07: Confused and unarousable 04/08: Comfort care  Assessment and Plan: Lisa [name] is a 61 year old female presenting with worsening leg swelling, fatigue, abdominal and breast swelling x 2 weeks, found to have large left pleural effusion S/P thoracentesis on 4/2 at [facility]. Also found to have pericardial effusion concerning for cardiac tamponade on, transferred to [facility] for pericardiocentesis. Past medical history significant for hepatocellular carcinoma, portal vein thrombosis, hepatitis C, polysubstance abuse, chronic back pain. Patient admitted to [facility] and was transferred to [facility] on April 2 for further care.  #Obtunded. Likely secondary to hepatic encephalopathy: PO lactulose and lactulose enema yesterday did not help. Patient has agonal breathing and is unarousable. Actively dying - Comfort care - IV morphine for air hunger and pain control   #Dyspnea  Left-Sided Pleural Effusion:  - Comfort care as above   #Pericardial Effusion with Tamponade: S/p thoracocentesis. Found on echo 4/2. Pericardiocentesis performed 4/2 with 600 mL with drain placed. Light criteria fluid transudative. Unlikely malignancy. - Comfort care as above  #Multifocal Hepatocellular Carcinoma, Stage II: Diagnosed 07/2016, on Nivolumab q2 weeks with oncologist. [name] initiated on 11/24/2016.  Status post IV albumin and IV Lasix at [facility].  #Liver Cirrhosis with Ascites, H/o Treated Hep C  Abdominal pain: Stable. Paracentesis labs support transudative effusion. Secondary to liver cancer, no signs of SBP. AST decreasing, ALT and ALP WNL.  #Cardiac Mass: Gelatinous mass noted at the RA/RV junction measuring 1.5 cm in diameter. Possibly malignant in etiology given history of hepatocellular carcinoma.  #Polysubstance Abuse: History of tobacco abuse as well as alcohol, cocaine, and marijuana. UDS positive for cocaine on admission.  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 Not on chronic anticoagulation per her discussion with oncology with risks outweighing benefits.  #Hypertension: Chronic. Uncontrolled. Home med include ACE-I and CCB. --Holding home antihypertensives  #Chronic back pain: No change from baseline.  #Thrombocytopenia: Stable. Platelets 119 today. Likely secondary to hepatic cirrhosis.  #Hyponatremia: Stable. Likely secondary to cirrhosis and or HCTZ use which was recently discontinued by her oncologist.  #Suspected COPD: On dulera and albuterol at home. - Holding home inhalers   FEN/GI: Clear liquid diet, SLIV PPx: SCDs  Disposition: Continue to monitor inpatient.  Subjective:  Patient obtunded and unresponsive  Objective: Temp:  [97.4 F (36.3 C)-99.6 F (37.6 C)] 99.4 F (37.4 C) (04/08 0800) Pulse Rate:  [60-155] 155 (04/08 0800) Resp:  [10-25] 12 (04/08 1000) BP: (72-145)/(38-75) 72/38 (04/08 1000) SpO2:  [92 %-100 %] 92 % (04/08 0800) Weight:  [128 lb 12 oz (58.4 kg)] 128 lb 12 oz (58.4 kg) (04/08 0403) Physical Exam: General: well nourished, well developed, unresponsive to stimuli HEENT: NG tube in place with green colored output CV: Tachycardic rate and irregular rhythm without murmurs, rubs, or gallops. 2+ pitting edema in ankles. SCDs in place Lungs: Agonal breathing with upper airway noises Abdomen:  Distended, normoactive bowel sounds Skin: warm, dry, no rashes or lesions, cap refill < 2  seconds Extremities: warm and well perfused, normal tone  Laboratory:  Recent Labs Lab 12/29/16 0218 12/30/16 0235 12/31/16 0034  WBC 9.3 7.6 9.7  HGB 15.8* 15.3* 15.0  HCT 43.6 42.8 42.2  PLT 125* 161 174    Recent Labs Lab 01/06/2017 0445  12/27/16 0242 12/28/16 0204  12/30/16 0235 12/31/16 0034 12/31/16 0618  NA 129*  --  128* 126*  < > 128* 133* 133*  K 4.9  --  5.5* 5.6*  < > 5.1 4.9 5.5*  CL 101  --  100* 99*  < > 95* 95* 96*  CO2 24  --  22 22  < > 26 27 23   BUN 21*  --  19 17  < > 17 17 19   CREATININE 0.85  < > 0.94 0.74  < > 0.74 0.70 0.77  CALCIUM 8.3*  --  8.0* 8.1*  < > 8.9 9.4 9.1  PROT 7.6  --  7.7 7.8  --   --   --   --   BILITOT 1.7*  --  1.7* 1.9*  --   --   --   --   ALKPHOS 117  --  103 103  --   --   --   --   ALT 38  --  37 32  --   --   --   --   AST 115*  --  109* 85*  --   --   --   --   GLUCOSE 77  < > 119* 103*  < > 118* 102* 82  < > = values in this interval not displayed.  UDS: +Cocaine HIV: Non-reactive Lactate dehydrogenase: 305 > 91 (H) INR: 1.23  TSH: 3.462 (WNL)  Imaging/Diagnostic Tests: DG Chest 2 View (12/30/2016) FINDINGS: Low lung volumes. Stable cardiomediastinal silhouette with normal heart size and aortic atherosclerosis. No pneumothorax. Stable small right and small to moderate left pleural effusions. No pulmonary edema. Patchy consolidation at the left greater than right lung bases, stable.  IMPRESSION: 1. Stable small moderate left and small right pleural effusions. 2. Stable patchy left greater than right bibasilar lung consolidation, worrisome for multilobar pneumonia or aspiration. 3. Aortic atherosclerosis.  Echocardiogram (12/28/2016) IMPRESSION: Mild inferolateral pericardial effusion, no evidence of tamponade. Large left pleural effusion.  Pericardiocentesis (01/16/2017) Successful pericardiocentesis with 600 mL of  fluid removed. Monitor output of the drain. Hopefully, this can be removed soon. She does have significant ascites which may need to be managed with paracentesis.  US THORACENTESIS ASP PLEURAL SPACE W/IMG GUIDE (01/10/2017) FINDINGS: A total of approximately 800 mL of clear, yellow fluid was removed. Samples were sent to the laboratory as requested by the clinical team.  IMPRESSION: Successful ultrasound guided diagnostic and therapeutic left thoracentesis yielding 800 mL of pleural fluid.  DG Chest 2 View (12/31/2016) FINDINGS: Moderate left pleural effusion. Mild atelectasis or consolidation adjacent to the pleural effusion. The right lung is clear. Pulmonary vasculature is normal.  IMPRESSION: New left pleural effusion with adjacent left lung base atelectasis or consolidation.  DG Chest 1 View (01/06/2017) FINDINGS: Stable cardiomegaly. Atherosclerosis of thoracic aorta is noted. No pneumothorax is seen status post left thoracentesis. Left pleural effusion is significantly smaller. Left basilar atelectasis or infiltrate is noted. Mild right basilar atelectasis is noted as well.  IMPRESSION: No pneumothorax status post left-sided thoracentesis. Left pleural effusion is significantly smaller.  Echocardiogram (01/19/2017) Study Conclusions - Left ventricle: The cavity size was normal. Wall thickness wasincreased in a pattern  of severe LVH. Systolic function wasvigorous. The estimated ejection fraction was in the range of 65%to 70%. Wall motion was normal; there were no regional wallmotion abnormalities. Doppler parameters are consistent withabnormal left ventricular relaxation (grade 1 diastolicdysfunction). The E/e&' ratio is >15, suggesting elevated LVfilling pressure. - Aortic valve: Trileaflet. Mobile strands at the leaflet tips -could be Lambl&'s excrescences (normal variant) or less likelythrombus/vegetation. - Mitral valve: Calcified annulus. Mildly thickened leaflets .  Mildregurgitation. There is >25% mitral inflow variation withrespiration. - Left atrium: Severely dilated. - Right ventricle: The cavity size was normal. There is thickeningof the RV free wall with a mass noted at the RV/RA junction -this could represent thrombus or malignancy. Systolic functionwas normal. Diastolic collapse is noted. - Right atrium: Normal size. Diastolic collapse of the free wall isnoted. - Tricuspid valve: There was trivial regurgitation. Greater than35% tricuspid inflow variation with respiration. - Pulmonary arteries: Dilated. - Systemic veins: The IVC measures <2.1 cm, but does not collapse >50%, suggesting an elevated RA pressure of 8 mmHg. - Pericardium, extracardiac: Moderate to large circumferential pericardial effusion. There was a right pleural effusion. There was a left pleural effusion. Ascites is noted.  Impressions: Compared to a prior study in 07/2016, the pericardial effusion is larger - now moderate to severe with associated tamponade features (dilated IVC, RV and RA free wall collapse, mitral and tricuspid inflow variation), bilateral pleural effusions and ascites are noted. There is thickening of the RV free wall and a gelatinous mass noted at the RA/RV junction measuring about 1.5 cm in diameter. Clinical correlation with tamponade physiology is highly recommended.  Carlyle Dolly, MD 01/12/17, 11:00 AM PGY-2, [place] Intern pager: [phone], text pages welcome

## 2017-01-24 NOTE — Progress Notes (Signed)
Patient taken to morgue with silver- and gold-colored ring present on left middle finger.  [place], Ardeth Sportsman

## 2017-01-24 NOTE — Progress Notes (Signed)
PCCM progress note  Evaluated patient. No clinical change. She received additional lactulose but her mental status has not improved. She is now tachycardic in the 160s with agonal respiratory pattern. Family at bedside. She will not improve. Agree with comfort measures. Please call if we can assist in any way  Baltazar Apo, MD, PhD Jan 22, 2017, 10:03 AM [facility] [phone] or if no answer [phone]

## 2017-01-24 NOTE — Progress Notes (Signed)
Patient asystole on monitor, no heart tones or breath sounds auscultated and verified by 2 RN's-Daveigh Batty and [facility].  100 cc of Morphine wasted and witnessed by [facility].  Daughter and granddaughter at bedside and emotional support given; family declined Clinical biochemist.  Attending MD Dr. Juanito Doom notified.  Will continue to offer emotional support to family. [place], Ardeth Sportsman

## 2017-01-24 DEATH — deceased

## 2017-02-15 ENCOUNTER — Ambulatory Visit: Payer: No Typology Code available for payment source | Admitting: Interventional Cardiology
